# Patient Record
Sex: Female | Born: 1935 | Race: White | Hispanic: No | Smoking: Never smoker
Health system: Southern US, Community
[De-identification: ages and names within clinical notes are randomized; demographics above are authoritative.]

## PROBLEM LIST (undated history)

## (undated) DIAGNOSIS — I10 Essential (primary) hypertension: Secondary | ICD-10-CM

## (undated) DIAGNOSIS — M109 Gout, unspecified: Secondary | ICD-10-CM

## (undated) DIAGNOSIS — K5190 Ulcerative colitis, unspecified, without complications: Secondary | ICD-10-CM

## (undated) DIAGNOSIS — E119 Type 2 diabetes mellitus without complications: Secondary | ICD-10-CM

## (undated) DIAGNOSIS — C189 Malignant neoplasm of colon, unspecified: Secondary | ICD-10-CM

## (undated) DIAGNOSIS — H409 Unspecified glaucoma: Secondary | ICD-10-CM

## (undated) DIAGNOSIS — G4730 Sleep apnea, unspecified: Secondary | ICD-10-CM

## (undated) DIAGNOSIS — M8580 Other specified disorders of bone density and structure, unspecified site: Secondary | ICD-10-CM

## (undated) HISTORY — DX: Malignant neoplasm of colon, unspecified: C18.9

## (undated) HISTORY — DX: Unspecified glaucoma: H40.9

## (undated) HISTORY — DX: Sleep apnea, unspecified: G47.30

## (undated) HISTORY — PX: CATARACT EXTRACTION: SUR2

## (undated) HISTORY — DX: Ulcerative colitis, unspecified, without complications: K51.90

## (undated) HISTORY — DX: Essential (primary) hypertension: I10

## (undated) HISTORY — DX: Type 2 diabetes mellitus without complications: E11.9

## (undated) HISTORY — DX: Gout, unspecified: M10.9

## (undated) HISTORY — DX: Other specified disorders of bone density and structure, unspecified site: M85.80

---

## 1989-01-15 HISTORY — PX: PARTIAL COLECTOMY: SHX5273

## 1991-01-16 HISTORY — PX: BILATERAL SALPINGOOPHORECTOMY: SHX1223

## 1994-01-15 HISTORY — PX: EXPLORATORY LAPAROTOMY: SUR591

## 1997-05-04 ENCOUNTER — Other Ambulatory Visit: Admission: RE | Admit: 1997-05-04 | Discharge: 1997-05-04 | Payer: Self-pay | Admitting: Obstetrics and Gynecology

## 1997-09-01 ENCOUNTER — Ambulatory Visit (HOSPITAL_COMMUNITY): Admission: RE | Admit: 1997-09-01 | Discharge: 1997-09-01 | Payer: Self-pay | Admitting: *Deleted

## 1998-04-19 ENCOUNTER — Other Ambulatory Visit: Admission: RE | Admit: 1998-04-19 | Discharge: 1998-04-19 | Payer: Self-pay | Admitting: Obstetrics and Gynecology

## 1998-09-26 ENCOUNTER — Ambulatory Visit (HOSPITAL_COMMUNITY): Admission: RE | Admit: 1998-09-26 | Discharge: 1998-09-26 | Payer: Self-pay | Admitting: *Deleted

## 1998-09-26 ENCOUNTER — Encounter: Payer: Self-pay | Admitting: *Deleted

## 1998-10-05 ENCOUNTER — Ambulatory Visit (HOSPITAL_COMMUNITY): Admission: RE | Admit: 1998-10-05 | Discharge: 1998-10-05 | Payer: Self-pay | Admitting: Gastroenterology

## 1998-10-05 ENCOUNTER — Encounter (INDEPENDENT_AMBULATORY_CARE_PROVIDER_SITE_OTHER): Payer: Self-pay | Admitting: Specialist

## 1999-04-26 ENCOUNTER — Other Ambulatory Visit: Admission: RE | Admit: 1999-04-26 | Discharge: 1999-04-26 | Payer: Self-pay | Admitting: Obstetrics and Gynecology

## 2000-05-08 ENCOUNTER — Other Ambulatory Visit: Admission: RE | Admit: 2000-05-08 | Discharge: 2000-05-08 | Payer: Self-pay | Admitting: Obstetrics and Gynecology

## 2000-12-02 ENCOUNTER — Encounter (INDEPENDENT_AMBULATORY_CARE_PROVIDER_SITE_OTHER): Payer: Self-pay

## 2000-12-02 ENCOUNTER — Ambulatory Visit (HOSPITAL_COMMUNITY): Admission: RE | Admit: 2000-12-02 | Discharge: 2000-12-02 | Payer: Self-pay | Admitting: Gastroenterology

## 2001-05-14 ENCOUNTER — Other Ambulatory Visit: Admission: RE | Admit: 2001-05-14 | Discharge: 2001-05-14 | Payer: Self-pay | Admitting: *Deleted

## 2002-06-22 ENCOUNTER — Other Ambulatory Visit: Admission: RE | Admit: 2002-06-22 | Discharge: 2002-06-22 | Payer: Self-pay | Admitting: Obstetrics and Gynecology

## 2002-09-30 ENCOUNTER — Encounter: Payer: Self-pay | Admitting: *Deleted

## 2002-09-30 ENCOUNTER — Ambulatory Visit (HOSPITAL_COMMUNITY): Admission: RE | Admit: 2002-09-30 | Discharge: 2002-09-30 | Payer: Self-pay | Admitting: *Deleted

## 2002-10-07 ENCOUNTER — Ambulatory Visit (HOSPITAL_COMMUNITY): Admission: RE | Admit: 2002-10-07 | Discharge: 2002-10-08 | Payer: Self-pay | Admitting: *Deleted

## 2003-06-30 ENCOUNTER — Other Ambulatory Visit: Admission: RE | Admit: 2003-06-30 | Discharge: 2003-06-30 | Payer: Self-pay | Admitting: Obstetrics and Gynecology

## 2003-11-23 ENCOUNTER — Ambulatory Visit (HOSPITAL_COMMUNITY): Admission: RE | Admit: 2003-11-23 | Discharge: 2003-11-23 | Payer: Self-pay | Admitting: Gastroenterology

## 2004-12-20 ENCOUNTER — Ambulatory Visit (HOSPITAL_BASED_OUTPATIENT_CLINIC_OR_DEPARTMENT_OTHER): Admission: RE | Admit: 2004-12-20 | Discharge: 2004-12-20 | Payer: Self-pay | Admitting: Internal Medicine

## 2004-12-24 ENCOUNTER — Ambulatory Visit: Payer: Self-pay | Admitting: Internal Medicine

## 2005-10-03 ENCOUNTER — Other Ambulatory Visit: Admission: RE | Admit: 2005-10-03 | Discharge: 2005-10-03 | Payer: Self-pay | Admitting: Obstetrics and Gynecology

## 2005-10-25 ENCOUNTER — Ambulatory Visit (HOSPITAL_BASED_OUTPATIENT_CLINIC_OR_DEPARTMENT_OTHER): Admission: RE | Admit: 2005-10-25 | Discharge: 2005-10-25 | Payer: Self-pay | Admitting: Family Medicine

## 2005-10-28 ENCOUNTER — Ambulatory Visit: Payer: Self-pay | Admitting: Internal Medicine

## 2006-12-03 ENCOUNTER — Emergency Department (HOSPITAL_COMMUNITY): Admission: EM | Admit: 2006-12-03 | Discharge: 2006-12-03 | Payer: Self-pay | Admitting: Emergency Medicine

## 2007-05-06 ENCOUNTER — Encounter: Admission: RE | Admit: 2007-05-06 | Discharge: 2007-07-15 | Payer: Self-pay | Admitting: Family Medicine

## 2007-09-29 ENCOUNTER — Encounter: Admission: RE | Admit: 2007-09-29 | Discharge: 2007-09-29 | Payer: Self-pay | Admitting: Gastroenterology

## 2007-11-05 ENCOUNTER — Other Ambulatory Visit: Admission: RE | Admit: 2007-11-05 | Discharge: 2007-11-05 | Payer: Self-pay | Admitting: Obstetrics and Gynecology

## 2008-01-16 HISTORY — PX: TOTAL KNEE ARTHROPLASTY: SHX125

## 2008-08-06 ENCOUNTER — Encounter: Admission: RE | Admit: 2008-08-06 | Discharge: 2008-08-06 | Payer: Self-pay | Admitting: Family Medicine

## 2008-09-16 ENCOUNTER — Encounter: Admission: RE | Admit: 2008-09-16 | Discharge: 2008-09-16 | Payer: Self-pay | Admitting: Family Medicine

## 2008-10-18 ENCOUNTER — Inpatient Hospital Stay (HOSPITAL_COMMUNITY): Admission: RE | Admit: 2008-10-18 | Discharge: 2008-10-21 | Payer: Self-pay | Admitting: Orthopedic Surgery

## 2008-11-23 ENCOUNTER — Encounter: Admission: RE | Admit: 2008-11-23 | Discharge: 2008-12-30 | Payer: Self-pay | Admitting: Orthopedic Surgery

## 2009-03-28 ENCOUNTER — Encounter: Admission: RE | Admit: 2009-03-28 | Discharge: 2009-03-28 | Payer: Self-pay | Admitting: Family Medicine

## 2010-04-20 LAB — ABO/RH: ABO/RH(D): O POS

## 2010-04-20 LAB — PROTIME-INR
INR: 0.98 (ref 0.00–1.49)
INR: 1.25 (ref 0.00–1.49)
INR: 1.43 (ref 0.00–1.49)
Prothrombin Time: 12.9 seconds (ref 11.6–15.2)
Prothrombin Time: 15.6 seconds — ABNORMAL HIGH (ref 11.6–15.2)

## 2010-04-20 LAB — CBC
HCT: 27.1 % — ABNORMAL LOW (ref 36.0–46.0)
HCT: 32.5 % — ABNORMAL LOW (ref 36.0–46.0)
Hemoglobin: 8.9 g/dL — ABNORMAL LOW (ref 12.0–15.0)
Hemoglobin: 9 g/dL — ABNORMAL LOW (ref 12.0–15.0)
Platelets: 304 10*3/uL (ref 150–400)
RBC: 3.12 MIL/uL — ABNORMAL LOW (ref 3.87–5.11)
WBC: 11.6 10*3/uL — ABNORMAL HIGH (ref 4.0–10.5)
WBC: 8 10*3/uL (ref 4.0–10.5)
WBC: 8.3 10*3/uL (ref 4.0–10.5)

## 2010-04-20 LAB — BASIC METABOLIC PANEL
BUN: 7 mg/dL (ref 6–23)
Calcium: 8 mg/dL — ABNORMAL LOW (ref 8.4–10.5)
Calcium: 8.3 mg/dL — ABNORMAL LOW (ref 8.4–10.5)
Creatinine, Ser: 0.85 mg/dL (ref 0.4–1.2)
GFR calc Af Amer: >60 mL/min (ref >60)
GFR calc non Af Amer: >60 mL/min (ref >60)
GFR calc non Af Amer: >60 mL/min (ref >60)
Potassium: 4.1 mEq/L (ref 3.5–5.1)
Sodium: 135 mEq/L (ref 135–145)

## 2010-04-20 LAB — TYPE AND SCREEN

## 2010-04-21 LAB — COMPREHENSIVE METABOLIC PANEL
AST: 14 U/L (ref 0–37)
Alkaline Phosphatase: 71 U/L (ref 39–117)
BUN: 10 mg/dL (ref 6–23)
CO2: 30 mEq/L (ref 19–32)
Chloride: 106 mEq/L (ref 96–112)
Creatinine, Ser: 0.78 mg/dL (ref 0.4–1.2)
GFR calc Af Amer: >60 mL/min (ref >60)
GFR calc non Af Amer: >60 mL/min (ref >60)
Potassium: 4.2 mEq/L (ref 3.5–5.1)
Total Bilirubin: 0.7 mg/dL (ref 0.3–1.2)

## 2010-04-21 LAB — CBC
HCT: 35.5 % — ABNORMAL LOW (ref 36.0–46.0)
MCV: 88.6 fL (ref 78.0–100.0)
RBC: 4.01 MIL/uL (ref 3.87–5.11)
WBC: 6.3 10*3/uL (ref 4.0–10.5)

## 2010-04-21 LAB — URINALYSIS, ROUTINE W REFLEX MICROSCOPIC
Bilirubin Urine: NEGATIVE
Glucose, UA: NEGATIVE mg/dL
Hgb urine dipstick: NEGATIVE
Ketones, ur: NEGATIVE mg/dL
Nitrite: NEGATIVE
Specific Gravity, Urine: 1.008 (ref 1.005–1.030)
pH: 6 (ref 5.0–8.0)

## 2010-04-21 LAB — APTT: aPTT: 26 seconds (ref 24–37)

## 2010-06-02 NOTE — Procedures (Signed)
[facility]  Patient:    Jenna Chambers, Jenna Chambers Visit Number: [id number] MRN: [id number]          Service Type: END Location: ENDO Attending Physician:  Orland Mustard Dictated by:   Llana Aliment. Randa Evens, M.D. Proc. Date: 12/02/00 Admit Date:  12/02/2000   CC:         Meredith Staggers, M.D.   Procedure Report  PROCEDURE:  Colonoscopy and coagulation of polyp.  COLONOSCOPIST:  Llana Aliment. Edwards, M.D.  MEDICATIONS:  Fentanyl 87.5 mcg and Versed 9 mg IV.  SCOPE:  Olympus adult video colonoscope.  INDICATIONS:  The patient had some recent rectal bleeding.  She is a 77 year old woman who had a B2 carcinoma of hepatic flexure and approximately 10 years ago had a right hemicolectomy.  She has not had any recurrence, but had developed some recent rectal bleeding.  She was due early next year to have a repeat colonoscopy and for this reason we went ahead and did it now.  PROCEDURE:  The procedure had been explained to the patient and consent obtained.  With the patient in the left lateral decubitus position, the adult Olympus video colonoscope was inserted and advanced under direct visualization.  The prep was excellent and I easily reached the anastomosis. The small bowel was entered and normal.  The anastomosis was free of any recurrent tumor.  The scope was withdrawn and the transverse colon, splenic flexure, descending, and sigmoid colon were seen well.  No significant diverticular disease, no polyps.  In the rectum a 3-4 mm sessile polyp was cauterized.  Also internal hemorrhoids were seen in the anal canal.  No other polyps or other abnormalities were seen.  Scope was withdrawn, and the patient tolerated the procedure well.  ASSESSMENT: 1. Rectal polyp cauterized. 2. Internal hemorrhoids, probably source of recent bleeding.  PLAN:  Routine postpolypectomy instructions and will recommend repeating in three years.  Will give sheet of instructions  about local and general measures for hemorrhoids. Dictated by:   Llana Aliment. Randa Evens, M.D. Attending Physician:  Orland Mustard DD:  12/02/00 TD:  12/02/00 Job: [id number]

## 2010-06-02 NOTE — Procedures (Signed)
[name], Jenna Chambers                  ACCOUNT NO.:  [id number]   MEDICAL RECORD NO.:  [id number]          PATIENT TYPE:  OUT   LOCATION:  SLEEP CENTER                 FACILITY:  [facility]   PHYSICIAN:  Clinton D. Maple Hudson, MD, FCCP, FACPDATE OF BIRTH:  11-Oct-1933   DATE OF STUDY:                              NOCTURNAL POLYSOMNOGRAM   REFERRING PHYSICIAN:  Dr. Noberto Retort   INDICATION FOR STUDY:  Insomnia with sleep apnea.   EPWORTH SLEEPINESS SCORE:  Per patient was 0/24.  BMI 42.6, weight 250  pounds.   HOME MEDICATIONS:  Are listed and reviewed.   A diagnostic NPSG on December 20, 2004, had recorded an AHI of 50.9 per hour  with markedly fragmented sleep at that time.  CPAP titration study is now  requested.   SLEEP ARCHITECTURE:  Short total sleep time 177 minutes with sleep  efficiency 50%.  Stage 1 was 10%, stage 2 60%, stages 3 and 4 12%, REM 18%  of total sleep time.  Sleep latency 14 minutes, REM latency 306 minutes,  awake after sleep onset 164 minutes, arousal index 7.5.  No bedtime  medication was reported.  Sleep onset was 11:12 p.m. but sleep was  fragmented by repeated wakings, especially after midnight, with little  recorded sleep after 2 a.m. until nearly 4 a.m.   RESPIRATORY DATA:  CPAP titration protocol.  CPAP was titrated to 12 CWP,  AHI zero per hour.  Many different masks were reportedly tried, settling  finally on a medium Ultra Mirage mask.  She indicated that she did not like  wearing it and did not like the pressure on her face.   OXYGEN DATA:  With CPAP control, oxygen saturation held 94% on room air.   CARDIAC DATA:  Normal sinus rhythm.   MOVEMENT/PARASOMNIA:  Occasional limb jerk, insignificant.   IMPRESSION/RECOMMENDATION:  1. CPAP was titrated to 12 CWP, apnea/hypopnea index zero per hour.  She      was very uncomfortable with the mask she selected, a medium size Ultra      Mirage mask with heated humidifier.  She was uncomfortable with all   masks tried.  If CPAP therapy is to be successful, she will need to      work with the home care company on desensitization and comfort      measures.  It may be that she would better tolerate a small profile      mask such as a nasal pillows system.  If appropriate, a gentle sleep      aid might make it easier for her to tolerate the CPAP process until she      adjusts to it.  Alternative therapies, or a choice at her age not to      treat this condition, might be considered.  2. Her persistent sleep circumstance seems to be insomnia.  This may      actually be a second primary diagnosis      rather than entirely the result of her sleep-disordered breathing.  In      that case she may benefit from maintenance sleep medication if  clinically appropriate.      Clinton D. Maple Hudson, MD, [facility], FACP  Diplomate, Biomedical engineer of Sleep Medicine  Electronically Signed     CDY/MEDQ  D:  10/28/2005 12:45:07  T:  10/29/2005 13:56:11  Job:  [id number]

## 2010-06-02 NOTE — Cardiovascular Report (Signed)
Jenna Chambers, Jenna Chambers                            ACCOUNT NO.:  [id number]   MEDICAL RECORD NO.:  [id number]                   PATIENT TYPE:  OIB   LOCATION:  3743                                 FACILITY:  [facility]   PHYSICIAN:  Meade Maw, M.D.                 DATE OF BIRTH:  1931-09-12   DATE OF PROCEDURE:  DATE OF DISCHARGE:                              CARDIAC CATHETERIZATION   REFERRING PHYSICIAN:  Tama Headings. Marina Goodell, M.D.   INDICATIONS FOR PROCEDURE:  Ongoing chest pain with stress Cardiolite  revealing decreased uptake in the anterior apical region with evidence of  reversal.  She also has significant shortness of breath.  Her coronary risk  factors include hypertension, dyslipidemia, and family history.  It was  elected to have the patient admitted for observation secondary to her age,  obesity, and late timing of her catheterization.   PROCEDURE:  After obtaining written informed consent the patient was brought  to the outpatient cardiac catheterization laboratory in the postabsorptive  state.  Preoperative sedation was achieved using IV Versed.  The right groin  was prepped and draped in the usual sterile fashion.  Local anesthesia was  achieved using 1% Xylocaine.  A 6-French hemostasis sheath was placed into a  right femoral artery using the modified Seldinger technique.  Selective  coronary angiography was performed using a JL4, JR4 Judkins catheter.  Single plane ventriculogram was performed in the RAO position using a 6-  French pigtail curved catheter.  All catheter exchanges were made over a  guidewire.  Following the procedure there was no identifiable disease.  The  patient was transferred to the holding area.  The hemostasis sheath was  removed.  Hemostasis was achieved using digital pressure.  There were no  immediate complications.   FINDINGS:  The aortic pressure is 135/61.  LV pressure is 137/10.  The EDP  was 19.  Single plane ventriculogram reveals normal  wall motion.  There was  post PVC MR only.   CORONARY ANGIOGRAPHY:  The left main coronary artery was relatively short.  It bifurcated into the left anterior descending and circumflex vessel.  There is no disease in the left main coronary artery.   The left anterior descending gives rise to a large D1, a smaller D2, and  goes on to end as a large apical branch.  There is no disease in the left  anterior descending or its branches.   The circumflex vessel is a large branch.  Gives rise to a trivial OM 1,  trivial OM 2, and OM 3.  There is no disease in the circumflex or its  branches.   The right coronary artery is a large artery as well.  Gives rise to three RV  marginals, PDA, and a large PL branch.  There is no disease in the right  coronary artery or its branches.    FINAL  IMPRESSION:  1. Normal coronary angiography.  2. Normal single plane ventriculogram.   RECOMMENDATIONS:  Consider other etiologies for her chest pain.                                               Meade Maw, M.D.    HP/MEDQ  D:  10/07/2002  T:  10/08/2002  Job:  [id number]

## 2010-06-02 NOTE — Discharge Summary (Signed)
Jenna Chambers, Jenna Chambers                            ACCOUNT NO.:  [id number]   MEDICAL RECORD NO.:  [id number]                   PATIENT TYPE:  OIB   LOCATION:  3743                                 FACILITY:  [facility]   PHYSICIAN:  Meade Maw, M.D.                 DATE OF BIRTH:  Oct 22, 1931   DATE OF ADMISSION:  10/07/2002  DATE OF DISCHARGE:  10/08/2002                                 DISCHARGE SUMMARY   DISCHARGING PHYSICIAN:  Dr. Fraser Din.   HISTORY OF PRESENT ILLNESS/REASON FOR ADMISSION:  Jenna Chambers is a 77-year-  old female patient who was initially seen on hospital consultation, September 09, 2002, for complaints of chest pain.  Significant history of hypertension  and osteoarthritis, and obesity.  During that admission, patient underwent  adenosine Cardiolite stress testing, which showed decreased uptake in the  mid anterior wall involving the apex, with normal wall motion and ejection  fraction of 72%.  Due to patient's recurrent complaints of chest pain, Dr.  Fraser Din, in cooperation with the patient, determined that cardiac  catheterization was indicated, and patient was subsequently admitted for a  cardiac catheterization.   HOSPITAL COURSE:  1. Recurrent chest pain.  The patient underwent cardiac catheterization on     October 07, 2002, without any complications.  Site was unremarkable.     Patient essentially had a normal cardiac catheterization with normal wall     motion and an EF of 65%.  The patient stayed in the hospital for further     evaluation, and on the morning of discharge was without any significant     complaints, as the cath site was unremarkable and patient's vital signs     were stable, with a blood pressure of 134/68, heart rate 68 and sinus     rhythm on the monitor.   FINAL DISCHARGE DIAGNOSES:  1. Recurrent chest pain since June of 2004, with normal cardiac     catheterization on October 07, 2002.  2. Obesity.  3. Hypertension.  4.  Hypothyroidism.  5. Osteoarthritis.   DISCHARGE MEDICATIONS:  1. Altace 10 mg daily.  2. Synthroid 100 mcg daily.  3. Vioxx 12.5 mg daily.  4. Xalatan eye drops 0.005%, one drop both eyes at bedtime.  5. Aspirin 81 mg daily.  6. Prempro, previous dose.  7. Timoptic eye drops at previous dose.  8. Multivitamin daily.  9. Vitamin E daily.  10.      Calcium 1200 mg daily.   DISCHARGE INSTRUCTIONS:  Tylenol for any pain other than medications you are  currently taking for your osteoarthritis.   ACTIVITY:  No driving or sexual activity today.  No lifting greater than 5  pounds.  No bending, stooping or straining for the next two days.   DIET:  Cardiac, low fat, low cholesterol.   WOUND CARE:  Shower only for the next two days.  SPECIAL INSTRUCTIONS:  You need to call Dr. Lindell Spar office at [phone] if  you have pain, swelling or bruising at the catheter site.   FOLLOW-UP APPOINTMENT:  She is to see Dr. Fraser Din on Monday, November 02, 2002, at 11:30 a.m.      Allison L. Joretta Bachelor, M.D.    ALE/MEDQ  D:  10/08/2002  T:  10/09/2002  Job:  [id number]

## 2010-06-02 NOTE — Procedures (Signed)
NAMEJera, Headings Bailei                  ACCOUNT NO.:  [id number]   MEDICAL RECORD NO.:  [id number]          PATIENT TYPE:  OUT   LOCATION:  SLEEP CENTER                 FACILITY:  [facility]   PHYSICIAN:  Clinton D. Maple Hudson, M.D. DATE OF BIRTH:  02-09-1931   DATE OF STUDY:                              NOCTURNAL POLYSOMNOGRAM   REFERRING PHYSICIAN:  Dr. Noberto Retort.   DATE OF STUDY:  December 20, 2004.   INDICATION FOR STUDY:  Insomnia with sleep apnea.   EPWORTH SLEEPINESS SCORE:  10/24.   BMI:  42.   WEIGHT:  250 pounds.   SLEEP ARCHITECTURE:  Short total sleep time 191 minutes with sleep  efficiency 44%. Stage I was 23%, stage II 62%, stages III and IV 2%, REM 13%  of total sleep time. Sleep latency 22 minutes, awake after sleep onset 209  minutes, arousal index markedly increased at 80 per hour. Sleep onset was at  11:12 p.m. but sustained sleep was obtained only briefly around 1:30 a.m.  and again around 5 a.m. with markedly fragmented sleep. No bedtime  medication was reported. Two bathroom trips were described as prolonged by  the technician.   RESPIRATORY DATA:  Apnea/hypopnea index (AHI, RDI) 50.9 obstructive events  per hour of sleep indicating severe obstructive sleep apnea/hypopnea  syndrome. This included 39 obstructive apneas and 123 hypopneas. Onset was  too late and sleep was too fragmented to permit C-PAP titration by protocol  on the study night. Events were not positional. REM AHI 49 per hour.   OXYGEN DATA:  Mild to moderate snoring with oxygen desaturation to a nadir  of 63%. Mean oxygen saturation through the study was 95% on room air.   CARDIAC DATA:  Sinus bradycardia 52-56 per minute with occasional PVC.   MOVEMENT/PARASOMNIA:  Occasional leg jerk with little effect on sleep.  Prolonged bathroom trips x2. Complaint of hip pain when up in room.   IMPRESSION/RECOMMENDATIONS:  1.  Short and markedly fragmented sleep.  2.  Severe obstructive sleep apnea  during limited sleep time, AHI of 50.9      per hour with nonpositional events.      Mild to moderate snoring and oxygen desaturation to 63%.  3.  Consider return for C-PAP titration bringing a sleep medicine to improve      sleep consolidation for the study.      Clinton D. Maple Hudson, M.D.  Diplomate, Biomedical engineer of Sleep Medicine  Electronically Signed     CDY/MEDQ  D:  12/24/2004 11:45:56  T:  12/24/2004 23:52:13  Job:  [id number]

## 2010-06-02 NOTE — Op Note (Signed)
NAME:  Jenna Chambers, Jenna Chambers                  ACCOUNT NO.:  [id number]   MEDICAL RECORD NO.:  [id number]          PATIENT TYPE:  AMB   LOCATION:  ENDO                         [facility]ACILITY:  [facility]   PHYSICIAN:  James L. Malon Kindle., M.D.DATE OF BIRTH:  June 21, 1931   DATE OF PROCEDURE:  11/23/2003  DATE OF DISCHARGE:                                 OPERATIVE REPORT   PROCEDURE:  Colonoscopy.   MEDICATIONS:  Fentanyl 62.5 mcg, Versed 6 mg IV.   SCOPE:  Pediatric colonoscope.   INDICATIONS:  Patient with a previous history of D2 carcinoma of the hepatic  flexure and had a right hemicolectomy bout 10 years ago.  Rectal polyp  removed three years ago.   DESCRIPTION OF PROCEDURE:  The procedure had been explained to the patient  and consent obtained.  With the patient in the left lateral decubitus  position, the Olympus scope was inserted and advanced.  Prep was excellent.  We were able to reach the anastomosis, which was clearly seen, and the small  bowel was entered for a short distance.  The scope was withdrawn.  The  anastomosis, transverse colon, descending, and sigmoid colon were seen well.  Scattered diverticulosis.  No polyps seen throughout.  Some sticky, adherent  stool.  The rectum was free of polyps.  The scope was withdrawn.  The  patient tolerated the procedure well.   ASSESSMENT:  Previous history of colon cancer with negative colonoscopy at  this time.  V10.05.   PLAN:  Will recommend yearly Hemoccults and repeat colonoscopy in five  years.      JLE/MEDQ  D:  11/23/2003  T:  11/23/2003  Job:  [id number]   cc:   Meredith Staggers, M.D.  [address]  [place]  [place] [address]  Fax: [phone]

## 2012-05-27 ENCOUNTER — Other Ambulatory Visit: Payer: Self-pay | Admitting: Gastroenterology

## 2012-05-27 DIAGNOSIS — R1310 Dysphagia, unspecified: Secondary | ICD-10-CM

## 2012-06-02 ENCOUNTER — Ambulatory Visit
Admission: RE | Admit: 2012-06-02 | Discharge: 2012-06-02 | Disposition: A | Discharge status: Home / Self Care | Payer: Medicare Other | Source: Ambulatory Visit | Attending: Gastroenterology | Admitting: Gastroenterology

## 2012-06-02 DIAGNOSIS — R1310 Dysphagia, unspecified: Secondary | ICD-10-CM

## 2012-07-07 ENCOUNTER — Other Ambulatory Visit: Payer: Self-pay | Admitting: Gastroenterology

## 2013-02-06 ENCOUNTER — Ambulatory Visit: Payer: Self-pay | Admitting: Obstetrics and Gynecology

## 2013-02-06 ENCOUNTER — Ambulatory Visit: Payer: Self-pay | Admitting: Gynecology

## 2013-02-27 ENCOUNTER — Ambulatory Visit (INDEPENDENT_AMBULATORY_CARE_PROVIDER_SITE_OTHER): Payer: MEDICARE | Admitting: Gynecology

## 2013-02-27 ENCOUNTER — Encounter: Payer: Self-pay | Admitting: Gynecology

## 2013-02-27 VITALS — BP 150/85 | HR 62 | Resp 18 | Ht 64.0 in | Wt 164.0 lb

## 2013-02-27 DIAGNOSIS — K5190 Ulcerative colitis, unspecified, without complications: Secondary | ICD-10-CM | POA: Insufficient documentation

## 2013-02-27 DIAGNOSIS — I10 Essential (primary) hypertension: Secondary | ICD-10-CM | POA: Insufficient documentation

## 2013-02-27 DIAGNOSIS — E119 Type 2 diabetes mellitus without complications: Secondary | ICD-10-CM | POA: Insufficient documentation

## 2013-02-27 DIAGNOSIS — C189 Malignant neoplasm of colon, unspecified: Secondary | ICD-10-CM | POA: Insufficient documentation

## 2013-02-27 DIAGNOSIS — Z01419 Encounter for gynecological examination (general) (routine) without abnormal findings: Secondary | ICD-10-CM

## 2013-02-27 DIAGNOSIS — H409 Unspecified glaucoma: Secondary | ICD-10-CM | POA: Insufficient documentation

## 2013-02-27 NOTE — Patient Instructions (Signed)

## 2013-02-27 NOTE — Progress Notes (Signed)
80 y.o. Widowed Caucasian female   No obstetric history on file. here for annual exam. Pt reports menses are absent due to Menopause. She does not report post-menopasual bleeding  No LMP recorded.          Sexually active: no  The current method of family planning is post menopausal status.    Exercising: no  The patient does not participate in regular exercise at present. Last pap: 11/05/2007 Negative  Abnormal PAP: no Mammogram: 01/10/12 Bi-Rads 2 BSE: yes Colonoscopy: 2014- Normal DEXA: 12/2011  Alcohol: 1 drink/month Tobacco: no  Labs:  Shirline Frees, MD  Health Maintenance  Topic Date Due  . Tetanus/tdap  03/05/1952  . Colonoscopy  03/06/1983  . Zostavax  03/05/1993  . Pneumococcal Polysaccharide Vaccine Age 45 And Over  03/05/1998  . Influenza Vaccine  08/15/2012    No family history on file.  Patient Active Problem List   Diagnosis Date Noted  . Diabetes     Past Medical History  Diagnosis Date  . Diabetes     Type 2    No past surgical history on file.  Allergies: Latex and Penicillins  Current Outpatient Prescriptions  Medication Sig Dispense Refill  . Acetaminophen (TYLENOL EXTRA STRENGTH) 167 MG/5ML LIQD Take by mouth.      Marland Kitchen amLODipine (NORVASC) 5 MG tablet       . aspirin 81 MG tablet Take 81 mg by mouth daily.      . Cholecalciferol (VITAMIN D) 2000 UNITS CAPS Take by mouth.      . diclofenac sodium (VOLTAREN) 1 % GEL Apply topically as needed.      Marland Kitchen levothyroxine (SYNTHROID, LEVOTHROID) 125 MCG tablet       . losartan (COZAAR) 100 MG tablet       . meloxicam (MOBIC) 15 MG tablet Take 15 mg by mouth as needed for pain.      Marland Kitchen SIMBRINZA 1-0.2 % SUSP       . sulfaSALAzine (AZULFIDINE) 500 MG tablet        No current facility-administered medications for this visit.    ROS: Pertinent items are noted in HPI.  Exam:    BP 150/85  Pulse 62  Resp 18  Ht 5\' 4"  (1.626 m)  Wt 164 lb (74.39 kg)  BMI 28.14 kg/m2 Weight change: @WEIGHTCHANGE @  Last 3 height recordings:  Ht Readings from Last 3 Encounters:  02/27/13 5\' 4"  (1.626 m)   General appearance: alert, cooperative and appears stated age Head: Normocephalic, without obvious abnormality, atraumatic Neck: no adenopathy, no carotid bruit, no JVD, supple, symmetrical, trachea midline and thyroid not enlarged, symmetric, no tenderness/mass/nodules Lungs: clear to auscultation bilaterally Breasts: normal appearance, no masses or tenderness Heart: regular rate and rhythm, S1, S2 normal, no murmur, click, rub or gallop Abdomen: soft, non-tender; bowel sounds normal; no masses,  no organomegaly Extremities: extremities normal, atraumatic, no cyanosis or edema Skin: Skin color, texture, turgor normal. No rashes or lesions Lymph nodes: Cervical, supraclavicular, and axillary nodes normal. no inguinal nodes palpated Neurologic: Grossly normal   Pelvic: External genitalia:  no lesions              Urethra: normal appearing urethra with no masses, tenderness or lesions              Bartholins and Skenes: normal                 Vagina: atrophic  Cervix: normal appearance              Pap taken: no        Bimanual Exam:  Uterus:  Small, limited by habitus                                      Adnexa:    no masses                                      Rectovaginal: Confirms                                      Anus:  normal sphincter tone, no lesions  A: well woman Contraceptive management     P: mammogram counseled on breast self exam, mammography screening, adequate intake of calcium and vitamin D, diet and exercise return annually or prn Discussed PAP guideline changes, importance of weight bearing exercises, calcium, vit D and balanced diet.  An After Visit Summary was printed and given to the patient.

## 2014-03-19 ENCOUNTER — Ambulatory Visit: Payer: MEDICARE | Admitting: Gynecology

## 2014-03-22 ENCOUNTER — Ambulatory Visit: Payer: MEDICARE | Admitting: Certified Nurse Midwife

## 2014-03-31 ENCOUNTER — Ambulatory Visit: Payer: Self-pay | Admitting: Obstetrics and Gynecology

## 2014-04-07 ENCOUNTER — Ambulatory Visit: Payer: Self-pay | Admitting: Obstetrics and Gynecology

## 2014-04-28 ENCOUNTER — Encounter: Payer: Self-pay | Admitting: Obstetrics and Gynecology

## 2014-04-28 ENCOUNTER — Ambulatory Visit (INDEPENDENT_AMBULATORY_CARE_PROVIDER_SITE_OTHER): Payer: Medicare Other | Admitting: Obstetrics and Gynecology

## 2014-04-28 VITALS — BP 132/62 | HR 60 | Resp 14 | Ht 64.0 in | Wt 246.0 lb

## 2014-04-28 DIAGNOSIS — Z01419 Encounter for gynecological examination (general) (routine) without abnormal findings: Secondary | ICD-10-CM | POA: Diagnosis not present

## 2014-04-28 NOTE — Progress Notes (Signed)
Patient ID: Jenna Chambers, female   DOB: Jul 23, 1931, 81 y.o.   MRN: [id number] 81 y.o. G0P0 WidowedCaucasianF here for annual exam.    Took HRT for several years.  Stopped in 2010.  No bleeding or spotting.   Status post BSO in 1996.  Cystadenoma.  Still has uterus.   Hx of colon cancer.  Had partial colectomy in 1992.  Has ulcerative colitis.  Sees GI regularly.    Some urinary urgency.  Not new.   PCP:  Samara Snide, MD   Patient's last menstrual period was 01/15/1982 (approximate).          Sexually active: No. Widowed The current method of family planning is post menopausal status.    Exercising: No.  none. Smoker:  no  Health Maintenance: Pap:  11-05-07 normal History of abnormal Pap:  no MMG:  04-27-14-not resulted yet:  03-16-13 fatty/stable oil cysts left breast/nl:[facility].   Colonoscopy:  2014 normal with Dr. Lorna Few of colon CA and ulcerative colitis.  Next due 2019. BMD:   01-01-12  Osteopenia:  [facility].    TDaP:  PCP Screening Labs:  Hb today: PCP, Urine today: PCP   reports that she has never smoked. She does not have any smokeless tobacco history on file. She reports that she does not drink alcohol or use illicit drugs.  Past Medical History  Diagnosis Date  . Diabetes     Type 2  . Ulcerative colitis   . Colon cancer     resecetion  . Gout   . Hypertension   . Sleep apnea     no CPAP  . Osteopenia     Past Surgical History  Procedure Laterality Date  . Bilateral salpingoophorectomy  1993    serous cystadenoma  . Partial colectomy  1991  . Cataract extraction  2006, 2008    gluacoma  . Exploratory laparotomy  1996    incarcerated hernia  . Total knee arthroplasty Left 2010    Current Outpatient Prescriptions  Medication Sig Dispense Refill  . Acetaminophen (TYLENOL EXTRA STRENGTH) 167 MG/5ML LIQD Take by mouth.    Marland Kitchen amLODipine (NORVASC) 10 MG tablet Take 10 mg by mouth daily.    Marland Kitchen aspirin 81 MG tablet Take 81 mg by mouth daily.    .  Cholecalciferol (VITAMIN D) 2000 UNITS CAPS Take by mouth.    . diclofenac sodium (VOLTAREN) 1 % GEL Apply topically as needed.    Marland Kitchen levothyroxine (SYNTHROID, LEVOTHROID) 150 MCG tablet Take 150 mcg by mouth daily before breakfast.    . losartan (COZAAR) 100 MG tablet     . meloxicam (MOBIC) 15 MG tablet Take 15 mg by mouth as needed for pain.    Marland Kitchen sulfaSALAzine (AZULFIDINE) 500 MG tablet Take 1,000 mg by mouth 2 (two) times daily.    Marland Kitchen ZIOPTAN 0.0015 % SOLN Place 1 drop into both eyes daily.  12   No current facility-administered medications for this visit.    Family History  Problem Relation Age of Onset  . Heart disease Father     dec age 18  . Hypertension Father   . Osteoarthritis Mother   . Osteoporosis Mother   . Hypertension Sister   . Hypertension Brother   . Cancer Brother 90    colon ca    ROS:  Pertinent items are noted in HPI.  Otherwise, a comprehensive ROS was negative.  Exam:   BP 132/62 mmHg  Pulse 60  Resp 14  Ht 5'  4" (1.626 m)  Wt 246 lb (111.585 kg)  BMI 42.21 kg/m2  LMP 01/15/1982 (Approximate)    Height: 5\' 4"  (162.6 cm)  Ht Readings from Last 3 Encounters:  04/28/14 5\' 4"  (1.626 m)  02/27/13 5\' 4"  (1.626 m)    General appearance: alert, cooperative and appears stated age Head: Normocephalic, without obvious abnormality, atraumatic Neck: no adenopathy, supple, symmetrical, trachea midline and thyroid normal to inspection and palpation Lungs: clear to auscultation bilaterally Breasts: normal appearance, no masses or tenderness, Inspection negative, No nipple retraction or dimpling, No nipple discharge or bleeding, No axillary or supraclavicular adenopathy Heart: regular rate and rhythm Abdomen: vertical midline incision, obese, soft, non-tender; bowel sounds normal; no masses,  no organomegaly Extremities: extremities normal, atraumatic, no cyanosis or edema Skin: Skin color, texture, turgor normal. No rashes or lesions Lymph nodes: Cervical,  supraclavicular, and axillary nodes normal. No abnormal inguinal nodes palpated Neurologic: Grossly normal   Pelvic: External genitalia:  no lesions              Urethra:  normal appearing urethra with no masses, tenderness or lesions              Bartholins and Skenes: normal                 Vagina: normal appearing vagina with normal color and discharge, no lesions.  Exam limited by body habitus.               Cervix: no lesions              Pap taken: Yes.   Bimanual Exam:  Uterus:  normal size, contour, position, consistency, mobility, non-tender              Adnexa: normal adnexa and no mass, fullness, tenderness               Rectovaginal: Confirms               Anus:  normal sphincter tone, no lesions  Chaperone was present for exam.  A:  Well Woman with normal exam Osteopenia.  Status post BSO.  History of colon cancer.   Status post partial colectomy.  Urinary urgency.   P:   Mammogram yearly.  pap smear done today.  Bone density at [facility].  Will assist with scheduling.  Discussed calcium, vit D, and weight bearing exercise.  Discussed bladder irritants.   return annually or prn

## 2014-04-28 NOTE — Patient Instructions (Signed)

## 2014-04-29 LAB — IPS PAP SMEAR ONLY

## 2014-05-26 ENCOUNTER — Telehealth: Payer: Self-pay

## 2014-05-26 NOTE — Telephone Encounter (Signed)
Spoke with patient. Advised patient we have received her BMD results from [facility] from 05/18/2014 study. Advised Dr. Quincy Simmonds recommends scheduling a consultation to discuss her osteopenia and increased fracture risk. Patient is agreeable. Patient would like to call back to schedule appointment as she does not have her calendar with her at this time.   Routing to provider for final review. Patient agreeable to disposition. Patient aware provider will review message and nurse will return call with any additional instructions or change of disposition. Will close encounter.

## 2014-05-26 NOTE — Telephone Encounter (Signed)
Left message to call [name] at [phone]. 

## 2015-02-02 ENCOUNTER — Ambulatory Visit: Payer: Self-pay | Admitting: Obstetrics and Gynecology

## 2015-03-23 DIAGNOSIS — K5190 Ulcerative colitis, unspecified, without complications: Secondary | ICD-10-CM | POA: Diagnosis not present

## 2015-03-23 DIAGNOSIS — Z85038 Personal history of other malignant neoplasm of large intestine: Secondary | ICD-10-CM | POA: Diagnosis not present

## 2015-04-05 DIAGNOSIS — Z85038 Personal history of other malignant neoplasm of large intestine: Secondary | ICD-10-CM | POA: Diagnosis not present

## 2015-04-11 DIAGNOSIS — I10 Essential (primary) hypertension: Secondary | ICD-10-CM | POA: Diagnosis not present

## 2015-04-11 DIAGNOSIS — E039 Hypothyroidism, unspecified: Secondary | ICD-10-CM | POA: Diagnosis not present

## 2015-04-11 DIAGNOSIS — E119 Type 2 diabetes mellitus without complications: Secondary | ICD-10-CM | POA: Diagnosis not present

## 2015-04-11 DIAGNOSIS — N189 Chronic kidney disease, unspecified: Secondary | ICD-10-CM | POA: Diagnosis not present

## 2015-04-27 DIAGNOSIS — H401111 Primary open-angle glaucoma, right eye, mild stage: Secondary | ICD-10-CM | POA: Diagnosis not present

## 2015-04-27 DIAGNOSIS — H1859 Other hereditary corneal dystrophies: Secondary | ICD-10-CM | POA: Diagnosis not present

## 2015-04-27 DIAGNOSIS — H401122 Primary open-angle glaucoma, left eye, moderate stage: Secondary | ICD-10-CM | POA: Diagnosis not present

## 2015-04-27 DIAGNOSIS — Z961 Presence of intraocular lens: Secondary | ICD-10-CM | POA: Diagnosis not present

## 2015-05-04 DIAGNOSIS — Z1231 Encounter for screening mammogram for malignant neoplasm of breast: Secondary | ICD-10-CM | POA: Diagnosis not present

## 2015-05-11 DIAGNOSIS — H401122 Primary open-angle glaucoma, left eye, moderate stage: Secondary | ICD-10-CM | POA: Diagnosis not present

## 2015-05-12 ENCOUNTER — Ambulatory Visit (INDEPENDENT_AMBULATORY_CARE_PROVIDER_SITE_OTHER): Payer: Medicare Other | Admitting: Obstetrics and Gynecology

## 2015-05-12 ENCOUNTER — Encounter: Payer: Self-pay | Admitting: Obstetrics and Gynecology

## 2015-05-12 VITALS — BP 152/68 | HR 60 | Resp 16 | Ht 63.75 in | Wt 245.0 lb

## 2015-05-12 DIAGNOSIS — M8580 Other specified disorders of bone density and structure, unspecified site: Secondary | ICD-10-CM

## 2015-05-12 DIAGNOSIS — Z124 Encounter for screening for malignant neoplasm of cervix: Secondary | ICD-10-CM | POA: Diagnosis not present

## 2015-05-12 DIAGNOSIS — Z01419 Encounter for gynecological examination (general) (routine) without abnormal findings: Secondary | ICD-10-CM

## 2015-05-12 NOTE — Progress Notes (Signed)
Patient ID: Jenna Chambers, female   DOB: [birth year], 82 y.o.   MRN: [id number] 82 y.o. G0P0 Widowed Caucasian female here for annual exam.    Concerns about medication for osteopenia.  Has hiatal hernia and ulcerative colitis.  Has not taken a reflux medication regularly.   Took Boniva for one year.  No problems with this.   GFR low per patient.   PCP:  Minette Brine, MD  Patient's last menstrual period was 01/15/1982 (approximate).           Sexually active: No. female The current method of family planning is post menopausal status.    Exercising: No.   Smoker:  no  Health Maintenance: Pap:  04-28-14 Neg History of abnormal Pap:  no MMG:  05-04-15 3D/Density A/Neg/BiRads1:[facility] Colonoscopy:  2014 normal Dr.James Edwards;next 2019 due to family history. BMD:   05-18-14  Result:Osteopenia, increased fracture risk:[facility] TDaP:  PCP Gardasil:   N/A Hep C: NA. Screening Labs:  Hb today: PCP, Urine today: PCP   reports that she has never smoked. She does not have any smokeless tobacco history on file. She reports that she does not drink alcohol or use illicit drugs.  Past Medical History  Diagnosis Date  . Diabetes ([place])     Type 2  . Ulcerative colitis ([place])   . Colon cancer ([place])     resecetion  . Gout   . Hypertension   . Sleep apnea     no CPAP  . Osteopenia   . Glaucoma     --both eyes per patient getting worse    Past Surgical History  Procedure Laterality Date  . Bilateral salpingoophorectomy  1993    serous cystadenoma  . Partial colectomy  1991  . Cataract extraction  2006, 2008    gluacoma  . Exploratory laparotomy  1996    incarcerated hernia  . Total knee arthroplasty Left 2010    Current Outpatient Prescriptions  Medication Sig Dispense Refill  . acetaminophen (TYLENOL) 500 MG tablet Take 1,000 mg by mouth every 6 (six) hours as needed.    Marland Kitchen amLODipine (NORVASC) 10 MG tablet Take 10 mg by mouth daily.    Marland Kitchen aspirin 81 MG tablet Take 81 mg by mouth  daily.    . Cholecalciferol (VITAMIN D) 2000 UNITS CAPS Take by mouth.    . diclofenac sodium (VOLTAREN) 1 % GEL Apply topically as needed.    . folic acid (FOLVITE) 1 MG tablet Take 1 mg by mouth daily.    Marland Kitchen levothyroxine (SYNTHROID, LEVOTHROID) 150 MCG tablet Take 150 mcg by mouth daily before breakfast.    . losartan (COZAAR) 100 MG tablet     . meloxicam (MOBIC) 15 MG tablet Take 15 mg by mouth as needed for pain.    Marland Kitchen sulfaSALAzine (AZULFIDINE) 500 MG tablet Take 1,000 mg by mouth 2 (two) times daily.    Marland Kitchen ZIOPTAN 0.0015 % SOLN Place 1 drop into both eyes daily.  12   No current facility-administered medications for this visit.    Family History  Problem Relation Age of Onset  . Heart disease Father     dec age 36  . Hypertension Father   . Osteoarthritis Mother   . Osteoporosis Mother   . Hypertension Sister   . Hypertension Brother   . Cancer Brother 90    colon ca    ROS:  Pertinent items are noted in HPI.  Otherwise, a comprehensive ROS was negative.  Exam:   BP  152/68 mmHg  Pulse 60  Resp 16  Ht 5' 3.75" (1.619 m)  Wt 245 lb (111.131 kg)  BMI 42.40 kg/m2  LMP 01/15/1982 (Approximate)    General appearance: alert, cooperative and appears stated age Head: Normocephalic, without obvious abnormality, atraumatic Neck: no adenopathy, supple, symmetrical, trachea midline and thyroid normal to inspection and palpation Lungs: clear to auscultation bilaterally Breasts: normal appearance, no masses or tenderness, Inspection negative, No nipple retraction or dimpling, No nipple discharge or bleeding Heart: regular rate and rhythm Abdomen: incisions:  Yes.     Vertical midline , soft, non-tender; no masses, no organomegaly Extremities: extremities normal, atraumatic, no cyanosis or edema Skin: Skin color, texture, turgor normal. No rashes or lesions Lymph nodes: Cervical, supraclavicular, and axillary nodes normal. No abnormal inguinal nodes palpated Neurologic: Grossly  normal  Pelvic: External genitalia:  no lesions              Urethra:  normal appearing urethra with no masses, tenderness or lesions              Bartholins and Skenes: normal                 Vagina: normal appearing vagina with normal color and discharge, no lesions              Cervix: no lesions              Pap taken: No. Bimanual Exam:  Uterus:  normal size, contour, position, consistency, mobility, non-tender              Adnexa: normal adnexa and no mass, fullness, tenderness              Rectal exam: Yes.  .  Confirms.              Anus:  normal sphincter tone, no lesions  Chaperone was present for exam.  Assessment:   Well woman visit with normal exam. Osteopenia.  At risk for fracture.  Hx malabsorption.  Hx hiatal hernia.  HTN.  Decreased GFR.  Plan: Yearly mammogram recommended after age 10.  Recommended self breast exam.  Pap and HR HPV as above. Discussed Calcium, Vitamin D, regular exercise program including cardiovascular and weight bearing exercise. Labs performed.  No..     Prescription medication(s) given.  No..    Discussed options for care for osteopenia - Prolia, Reclast, Boniva, Evista.  Written information to patienet.  I will have patient discuss tx of osteopenia with her PCP due to her multiple medical issues. Discussed reducing risk of falls. Follow up annually and prn.       After visit summary provided.

## 2015-05-12 NOTE — Patient Instructions (Addendum)
EXERCISE AND DIET:  We recommended that you start or continue a regular exercise program for good health. Regular exercise means any activity that makes your heart beat faster and makes you sweat.  We recommend exercising at least 30 minutes per day at least 3 days a week, preferably 4 or 5.  We also recommend a diet low in fat and sugar.  Inactivity, poor dietary choices and obesity can cause diabetes, heart attack, stroke, and kidney damage, among others.    ALCOHOL AND SMOKING:  Women should limit their alcohol intake to no more than 7 drinks/beers/glasses of wine (combined, not each!) per week. Moderation of alcohol intake to this level decreases your risk of breast cancer and liver damage. And of course, no recreational drugs are part of a healthy lifestyle.  And absolutely no smoking or even second hand smoke. Most people know smoking can cause heart and lung diseases, but did you know it also contributes to weakening of your bones? Aging of your skin?  Yellowing of your teeth and nails?  CALCIUM AND VITAMIN D:  Adequate intake of calcium and Vitamin D are recommended.  The recommendations for exact amounts of these supplements seem to change often, but generally speaking 600 mg of calcium (either carbonate or citrate) and 800 units of Vitamin D per day seems prudent. Certain women may benefit from higher intake of Vitamin D.  If you are among these women, your doctor will have told you during your visit.    PAP SMEARS:  Pap smears, to check for cervical cancer or precancers,  have traditionally been done yearly, although recent scientific advances have shown that most women can have pap smears less often.  However, every woman still should have a physical exam from her gynecologist every year. It will include a breast check, inspection of the vulva and vagina to check for abnormal growths or skin changes, a visual exam of the cervix, and then an exam to evaluate the size and shape of the uterus and  ovaries.  And after 80 years of age, a rectal exam is indicated to check for rectal cancers. We will also provide age appropriate advice regarding health maintenance, like when you should have certain vaccines, screening for sexually transmitted diseases, bone density testing, colonoscopy, mammograms, etc.   MAMMOGRAMS:  All women over 40 years old should have a yearly mammogram. Many facilities now offer a "3D" mammogram, which may cost around $50 extra out of pocket. If possible,  we recommend you accept the option to have the 3D mammogram performed.  It both reduces the number of women who will be called back for extra views which then turn out to be normal, and it is better than the routine mammogram at detecting truly abnormal areas.    COLONOSCOPY:  Colonoscopy to screen for colon cancer is recommended for all women at age 50.  We know, you hate the idea of the prep.  We agree, BUT, having colon cancer and not knowing it is worse!!  Colon cancer so often starts as a polyp that can be seen and removed at colonscopy, which can quite literally save your life!  And if your first colonoscopy is normal and you have no family history of colon cancer, most women don't have to have it again for 10 years.  Once every ten years, you can do something that may end up saving your life, right?  We will be happy to help you get it scheduled when you are ready.    Be sure to check your insurance coverage so you understand how much it will cost.  It may be covered as a preventative service at no cost, but you should check your particular policy.       Denosumab injection What is this medicine? DENOSUMAB (den oh sue mab) slows bone breakdown. Prolia is used to treat osteoporosis in women after menopause and in men. Delton See is used to prevent bone fractures and other bone problems caused by cancer bone metastases. Delton See is also used to treat giant cell tumor of the bone. This medicine may be used for other purposes; ask  your health care provider or pharmacist if you have questions. What should I tell my health care provider before I take this medicine? They need to know if you have any of these conditions: -dental disease -eczema -infection or history of infections -kidney disease or on dialysis -low blood calcium or vitamin D -malabsorption syndrome -scheduled to have surgery or tooth extraction -taking medicine that contains denosumab -thyroid or parathyroid disease -an unusual reaction to denosumab, other medicines, foods, dyes, or preservatives -pregnant or trying to get pregnant -breast-feeding How should I use this medicine? This medicine is for injection under the skin. It is given by a health care professional in a hospital or clinic setting. If you are getting Prolia, a special MedGuide will be given to you by the pharmacist with each prescription and refill. Be sure to read this information carefully each time. For Prolia, talk to your pediatrician regarding the use of this medicine in children. Special care may be needed. For Delton See, talk to your pediatrician regarding the use of this medicine in children. While this drug may be prescribed for children as young as 13 years for selected conditions, precautions do apply. Overdosage: If you think you have taken too much of this medicine contact a poison control center or emergency room at once. NOTE: This medicine is only for you. Do not share this medicine with others. What if I miss a dose? It is important not to miss your dose. Call your doctor or health care professional if you are unable to keep an appointment. What may interact with this medicine? Do not take this medicine with any of the following medications: -other medicines containing denosumab This medicine may also interact with the following medications: -medicines that suppress the immune system -medicines that treat cancer -steroid medicines like prednisone or cortisone This list  may not describe all possible interactions. Give your health care provider a list of all the medicines, herbs, non-prescription drugs, or dietary supplements you use. Also tell them if you smoke, drink alcohol, or use illegal drugs. Some items may interact with your medicine. What should I watch for while using this medicine? Visit your doctor or health care professional for regular checks on your progress. Your doctor or health care professional may order blood tests and other tests to see how you are doing. Call your doctor or health care professional if you get a cold or other infection while receiving this medicine. Do not treat yourself. This medicine may decrease your body's ability to fight infection. You should make sure you get enough calcium and vitamin D while you are taking this medicine, unless your doctor tells you not to. Discuss the foods you eat and the vitamins you take with your health care professional. See your dentist regularly. Brush and floss your teeth as directed. Before you have any dental work done, tell your dentist you are receiving this  medicine. Do not become pregnant while taking this medicine or for 5 months after stopping it. Women should inform their doctor if they wish to become pregnant or think they might be pregnant. There is a potential for serious side effects to an unborn child. Talk to your health care professional or pharmacist for more information. What side effects may I notice from receiving this medicine? Side effects that you should report to your doctor or health care professional as soon as possible: -allergic reactions like skin rash, itching or hives, swelling of the face, lips, or tongue -breathing problems -chest pain -fast, irregular heartbeat -feeling faint or lightheaded, falls -fever, chills, or any other sign of infection -muscle spasms, tightening, or twitches -numbness or tingling -skin blisters or bumps, or is dry, peels, or red -slow  healing or unexplained pain in the mouth or jaw -unusual bleeding or bruising Side effects that usually do not require medical attention (Report these to your doctor or health care professional if they continue or are bothersome.): -muscle pain -stomach upset, gas This list may not describe all possible side effects. Call your doctor for medical advice about side effects. You may report side effects to FDA at 1-800-FDA-1088. Where should I keep my medicine? This medicine is only given in a clinic, doctor's office, or other health care setting and will not be stored at home. NOTE: This sheet is a summary. It may not cover all possible information. If you have questions about this medicine, talk to your doctor, pharmacist, or health care provider.    2016, Elsevier/Gold Standard. (2011-07-02 12:37:47)  Zoledronic Acid injection (Paget's Disease, Osteoporosis) What is this medicine? ZOLEDRONIC ACID (ZOE le dron ik AS id) lowers the amount of calcium loss from bone. It is used to treat Paget's disease and osteoporosis in women. This medicine may be used for other purposes; ask your health care provider or pharmacist if you have questions. What should I tell my health care provider before I take this medicine? They need to know if you have any of these conditions: -aspirin-sensitive asthma -cancer, especially if you are receiving medicines used to treat cancer -dental disease or wear dentures -infection -kidney disease -low levels of calcium in the blood -past surgery on the parathyroid gland or intestines -receiving corticosteroids like dexamethasone or prednisone -an unusual or allergic reaction to zoledronic acid, other medicines, foods, dyes, or preservatives -pregnant or trying to get pregnant -breast-feeding How should I use this medicine? This medicine is for infusion into a vein. It is given by a health care professional in a hospital or clinic setting. Talk to your pediatrician  regarding the use of this medicine in children. This medicine is not approved for use in children. Overdosage: If you think you have taken too much of this medicine contact a poison control center or emergency room at once. NOTE: This medicine is only for you. Do not share this medicine with others. What if I miss a dose? It is important not to miss your dose. Call your doctor or health care professional if you are unable to keep an appointment. What may interact with this medicine? -certain antibiotics given by injection -NSAIDs, medicines for pain and inflammation, like ibuprofen or naproxen -some diuretics like bumetanide, furosemide -teriparatide This list may not describe all possible interactions. Give your health care provider a list of all the medicines, herbs, non-prescription drugs, or dietary supplements you use. Also tell them if you smoke, drink alcohol, or use illegal drugs. Some items may  interact with your medicine. What should I watch for while using this medicine? Visit your doctor or health care professional for regular checkups. It may be some time before you see the benefit from this medicine. Do not stop taking your medicine unless your doctor tells you to. Your doctor may order blood tests or other tests to see how you are doing. Women should inform their doctor if they wish to become pregnant or think they might be pregnant. There is a potential for serious side effects to an unborn child. Talk to your health care professional or pharmacist for more information. You should make sure that you get enough calcium and vitamin D while you are taking this medicine. Discuss the foods you eat and the vitamins you take with your health care professional. Some people who take this medicine have severe bone, joint, and/or muscle pain. This medicine may also increase your risk for jaw problems or a broken thigh bone. Tell your doctor right away if you have severe pain in your jaw, bones,  joints, or muscles. Tell your doctor if you have any pain that does not go away or that gets worse. Tell your dentist and dental surgeon that you are taking this medicine. You should not have major dental surgery while on this medicine. See your dentist to have a dental exam and fix any dental problems before starting this medicine. Take good care of your teeth while on this medicine. Make sure you see your dentist for regular follow-up appointments. What side effects may I notice from receiving this medicine? Side effects that you should report to your doctor or health care professional as soon as possible: -allergic reactions like skin rash, itching or hives, swelling of the face, lips, or tongue -anxiety, confusion, or depression -breathing problems -changes in vision -eye pain -feeling faint or lightheaded, falls -jaw pain, especially after dental work -mouth sores -muscle cramps, stiffness, or weakness -redness, blistering, peeling or loosening of the skin, including inside the mouth -trouble passing urine or change in the amount of urine Side effects that usually do not require medical attention (report to your doctor or health care professional if they continue or are bothersome): -bone, joint, or muscle pain -constipation -diarrhea -fever -hair loss -irritation at site where injected -loss of appetite -nausea, vomiting -stomach upset -trouble sleeping -trouble swallowing -weak or tired This list may not describe all possible side effects. Call your doctor for medical advice about side effects. You may report side effects to FDA at 1-800-FDA-1088. Where should I keep my medicine? This drug is given in a hospital or clinic and will not be stored at home. NOTE: This sheet is a summary. It may not cover all possible information. If you have questions about this medicine, talk to your doctor, pharmacist, or health care provider.    2016, Elsevier/Gold Standard. (2013-05-30  14:19:57)  Raloxifene tablets What is this medicine? RALOXIFENE (ral OX i feen) reduces the amount of calcium lost from bones. It is used to treat and prevent osteoporosis in women who have experienced menopause. This medicine may be used for other purposes; ask your health care provider or pharmacist if you have questions. What should I tell my health care provider before I take this medicine? They need to know if you have any of these conditions: -a history of blood clots -cancer -heart failure -liver disease -premenopausal -an unusual or allergic reaction to raloxifene, other medicines, foods, dyes, or preservatives -pregnant or trying to get pregnant -breast-feeding How  should I use this medicine? Take this medicine by mouth with a glass of water. Follow the directions on the prescription label. The tablets can be taken with or without food. Take your doses at regular intervals. Do not take your medicine more often than directed. Talk to your pediatrician regarding the use of this medicine in children. Special care may be needed. Overdosage: If you think you have taken too much of this medicine contact a poison control center or emergency room at once. NOTE: This medicine is only for you. Do not share this medicine with others. What if I miss a dose? If you miss a dose, take it as soon as you can. If it is almost time for your next dose, take only that dose. Do not take double or extra doses. What may interact with this medicine? -ampicillin -cholestyramine -colestipol -diazepam -diazoxide -female hormones like hormone replacement therapy -lidocaine -warfarin This list may not describe all possible interactions. Give your health care provider a list of all the medicines, herbs, non-prescription drugs, or dietary supplements you use. Also tell them if you smoke, drink alcohol, or use illegal drugs. Some items may interact with your medicine. What should I watch for while using  this medicine? Visit your doctor or health care professional for regular checks on your progress. Do not stop taking this medicine except on the advice of your doctor or health care professional. You should make sure you get enough calcium and vitamin D in your diet while you are taking this medicine. Discuss your dietary needs with your health care professional or nutritionist. Exercise may help to prevent bone loss. Discuss your exercise needs with your doctor or health care professional. This medicine can rarely cause blood clots. You should avoid long periods of bed rest while taking this medicine. If you are going to have surgery, tell your doctor or health care professional that you are taking this medicine. This medicine should be stopped at least 3 days before surgery. After surgery, it should be restarted only after you are walking again. It should not be restarted while you still need long periods of bed rest. You should not smoke while taking this medicine. Smoking may also increase your risk of blood clots. Smoking can also decrease the effects of this medicine. This medicine does not prevent hot flashes. It may cause hot flashes in some patients at the start of therapy. What side effects may I notice from receiving this medicine? Side effects that you should report to your doctor or health care professional as soon as possible: -change in vision -chest pain -difficulty breathing -leg pain or swelling -skin rash, itching Side effects that usually do not require medical attention (report to your doctor or health care professional if they continue or are bothersome): -fluid build-up -leg cramps -stomach pain -sweating This list may not describe all possible side effects. Call your doctor for medical advice about side effects. You may report side effects to FDA at 1-800-FDA-1088. Where should I keep my medicine? Keep out of the reach of children. Store at room temperature between 15  and 30 degrees C (59 and 86 degrees F). Throw away any unused medicine after the expiration date. NOTE: This sheet is a summary. It may not cover all possible information. If you have questions about this medicine, talk to your doctor, pharmacist, or health care provider.    2016, [employer]vier/Gold Standard. (2007-12-18 15:15:14)  Ibandronate monthly tablets What is this medicine? IBANDRONATE (i BAN droh nate) slows  calcium loss from bones. It is used to treat osteoporosis in women past the age of menopause. This medicine may be used for other purposes; ask your health care provider or pharmacist if you have questions. What should I tell my health care provider before I take this medicine? They need to know if you have any of these conditions: -dental disease -esophageal, stomach, or intestine problems, like acid reflux or GERD -kidney disease -low blood calcium -low vitamin D -problems sitting or standing for 60 minutes -trouble swallowing -an unusual or allergic reaction to ibandronate, other medicines, foods, dyes, or preservatives -pregnant or trying to get pregnant -breast-feeding How should I use this medicine? You must take this medicine exactly as directed or you will lower the amount of medicine you absorb into your body or you may cause yourself harm. Take your dose by mouth first thing in the morning, after you are up for the day. Do not eat or drink anything before you take this medicine. Swallow the tablet with a full glass (6 to 8 ounces) of plain water. Do not take this medicine with any other drink. Do not chew or crush the tablet. After taking this medicine, do not eat breakfast, drink, or take any other medicines or vitamins for at least 1 hour. Stand or sit up for at least 1 hour after taking this medicine. Do not lie down. Take this medicine on the same day every month. Do not take your medicine more often than directed. Talk to your pediatrician regarding the use of this  medicine in children. Special care may be needed. Overdosage: If you think you have taken too much of this medicine contact a poison control center or emergency room at once. NOTE: This medicine is only for you. Do not share this medicine with others. What if I miss a dose? If you miss a dose and your next dose is more than 7 days away then take the missed dose on the next morning you remember. Then take your next dose on your regular day of the month. If your next dose is due within the next 7 days then skip the missed dose. Do not take 2 tablets within 1 week of each other. Do not take double or extra doses. What may interact with this medicine? -aluminum hydroxide -antacids -aspirin -calcium supplements -drugs for inflammation like ibuprofen, naproxen, and others -iron supplements -magnesium supplements -vitamins with minerals This list may not describe all possible interactions. Give your health care provider a list of all the medicines, herbs, non-prescription drugs, or dietary supplements you use. Also tell them if you smoke, drink alcohol, or use illegal drugs. Some items may interact with your medicine. What should I watch for while using this medicine? Visit your doctor or health care professional for regular check ups. It may be some time before you see the benefit from this medicine. Do not stop taking your medicine unless your doctor tells you to. Your doctor may order blood tests and other tests to see how you are doing. You should make sure that you get enough calcium and vitamin D while you are taking this medicine. Discuss the foods you eat and the vitamins you take with your health care professional. Some people who take this medicine have severe bone, joint, and/or muscle pain. This medicine may also increase your risk for a broken thigh bone. Tell your doctor right away if you have pain in your upper leg or groin. Tell your doctor if you have  any pain that does not go away or  that gets worse. What side effects may I notice from receiving this medicine? Side effects that you should report to your doctor or health care professional as soon as possible: -allergic reactions such as skin rash or itching, hives, swelling of the face, lips, throat or tongue -black or tarry stools -change in vision -chest pain -heartburn or stomach pain -jaw pain, especially after dental work -redness, blistering, peeling, or loosening of the skin, including inside the mouth -trouble or pain when swallowing Side effects that usually do not require medical attention (report to your doctor or health care professional if they continue or are bothersome): -bone, muscle or joint pain -changes in taste -diarrhea or constipation -headache -nausea or vomiting -stomach gas or fullness This list may not describe all possible side effects. Call your doctor for medical advice about side effects. You may report side effects to FDA at 1-800-FDA-1088. Where should I keep my medicine? Keep out of the reach of children. Store at room temperature between 15 and 30 degrees C (59 and 86 degrees F). Throw away any unused medication after the expiration date. NOTE: This sheet is a summary. It may not cover all possible information. If you have questions about this medicine, talk to your doctor, pharmacist, or health care provider.    2016, [employer] Standard. (2010-06-30 09:03:09)

## 2015-06-29 DIAGNOSIS — H401111 Primary open-angle glaucoma, right eye, mild stage: Secondary | ICD-10-CM | POA: Diagnosis not present

## 2015-07-26 DIAGNOSIS — E039 Hypothyroidism, unspecified: Secondary | ICD-10-CM | POA: Diagnosis not present

## 2015-10-12 DIAGNOSIS — E039 Hypothyroidism, unspecified: Secondary | ICD-10-CM | POA: Diagnosis not present

## 2015-10-12 DIAGNOSIS — E559 Vitamin D deficiency, unspecified: Secondary | ICD-10-CM | POA: Diagnosis not present

## 2015-10-12 DIAGNOSIS — E7800 Pure hypercholesterolemia, unspecified: Secondary | ICD-10-CM | POA: Diagnosis not present

## 2015-10-12 DIAGNOSIS — E119 Type 2 diabetes mellitus without complications: Secondary | ICD-10-CM | POA: Diagnosis not present

## 2015-10-12 DIAGNOSIS — G4730 Sleep apnea, unspecified: Secondary | ICD-10-CM | POA: Diagnosis not present

## 2015-10-12 DIAGNOSIS — Z23 Encounter for immunization: Secondary | ICD-10-CM | POA: Diagnosis not present

## 2015-10-12 DIAGNOSIS — I10 Essential (primary) hypertension: Secondary | ICD-10-CM | POA: Diagnosis not present

## 2015-10-12 DIAGNOSIS — M1990 Unspecified osteoarthritis, unspecified site: Secondary | ICD-10-CM | POA: Diagnosis not present

## 2015-10-19 DIAGNOSIS — H401111 Primary open-angle glaucoma, right eye, mild stage: Secondary | ICD-10-CM | POA: Diagnosis not present

## 2015-11-17 DIAGNOSIS — H401111 Primary open-angle glaucoma, right eye, mild stage: Secondary | ICD-10-CM | POA: Diagnosis not present

## 2015-11-28 DIAGNOSIS — H903 Sensorineural hearing loss, bilateral: Secondary | ICD-10-CM | POA: Diagnosis not present

## 2015-12-22 DIAGNOSIS — H401111 Primary open-angle glaucoma, right eye, mild stage: Secondary | ICD-10-CM | POA: Diagnosis not present

## 2015-12-22 DIAGNOSIS — H1859 Other hereditary corneal dystrophies: Secondary | ICD-10-CM | POA: Diagnosis not present

## 2015-12-28 DIAGNOSIS — M1711 Unilateral primary osteoarthritis, right knee: Secondary | ICD-10-CM | POA: Diagnosis not present

## 2016-02-17 DIAGNOSIS — M1711 Unilateral primary osteoarthritis, right knee: Secondary | ICD-10-CM | POA: Diagnosis not present

## 2016-02-23 DIAGNOSIS — M1711 Unilateral primary osteoarthritis, right knee: Secondary | ICD-10-CM | POA: Diagnosis not present

## 2016-03-01 DIAGNOSIS — E119 Type 2 diabetes mellitus without complications: Secondary | ICD-10-CM | POA: Diagnosis not present

## 2016-03-01 DIAGNOSIS — I10 Essential (primary) hypertension: Secondary | ICD-10-CM | POA: Diagnosis not present

## 2016-03-01 DIAGNOSIS — E7800 Pure hypercholesterolemia, unspecified: Secondary | ICD-10-CM | POA: Diagnosis not present

## 2016-03-01 DIAGNOSIS — Z0000 Encounter for general adult medical examination without abnormal findings: Secondary | ICD-10-CM | POA: Diagnosis not present

## 2016-03-02 DIAGNOSIS — M1711 Unilateral primary osteoarthritis, right knee: Secondary | ICD-10-CM | POA: Diagnosis not present

## 2016-03-29 DIAGNOSIS — Z85038 Personal history of other malignant neoplasm of large intestine: Secondary | ICD-10-CM | POA: Diagnosis not present

## 2016-03-29 DIAGNOSIS — R1012 Left upper quadrant pain: Secondary | ICD-10-CM | POA: Diagnosis not present

## 2016-03-29 DIAGNOSIS — K5190 Ulcerative colitis, unspecified, without complications: Secondary | ICD-10-CM | POA: Diagnosis not present

## 2016-04-19 DIAGNOSIS — Z85038 Personal history of other malignant neoplasm of large intestine: Secondary | ICD-10-CM | POA: Diagnosis not present

## 2016-05-03 DIAGNOSIS — H401111 Primary open-angle glaucoma, right eye, mild stage: Secondary | ICD-10-CM | POA: Diagnosis not present

## 2016-05-07 DIAGNOSIS — M810 Age-related osteoporosis without current pathological fracture: Secondary | ICD-10-CM | POA: Diagnosis not present

## 2016-05-07 DIAGNOSIS — Z1231 Encounter for screening mammogram for malignant neoplasm of breast: Secondary | ICD-10-CM | POA: Diagnosis not present

## 2016-05-14 ENCOUNTER — Ambulatory Visit: Payer: Medicare Other | Admitting: Obstetrics and Gynecology

## 2016-05-25 DIAGNOSIS — H401111 Primary open-angle glaucoma, right eye, mild stage: Secondary | ICD-10-CM | POA: Diagnosis not present

## 2016-06-06 DIAGNOSIS — H401121 Primary open-angle glaucoma, left eye, mild stage: Secondary | ICD-10-CM | POA: Diagnosis not present

## 2016-06-06 DIAGNOSIS — H401112 Primary open-angle glaucoma, right eye, moderate stage: Secondary | ICD-10-CM | POA: Diagnosis not present

## 2016-08-19 DIAGNOSIS — M79674 Pain in right toe(s): Secondary | ICD-10-CM | POA: Diagnosis not present

## 2016-08-30 DIAGNOSIS — I10 Essential (primary) hypertension: Secondary | ICD-10-CM | POA: Diagnosis not present

## 2016-08-30 DIAGNOSIS — R0789 Other chest pain: Secondary | ICD-10-CM | POA: Diagnosis not present

## 2016-08-30 DIAGNOSIS — E7800 Pure hypercholesterolemia, unspecified: Secondary | ICD-10-CM | POA: Diagnosis not present

## 2016-08-30 DIAGNOSIS — E119 Type 2 diabetes mellitus without complications: Secondary | ICD-10-CM | POA: Diagnosis not present

## 2016-10-11 DIAGNOSIS — H401122 Primary open-angle glaucoma, left eye, moderate stage: Secondary | ICD-10-CM | POA: Diagnosis not present

## 2016-10-11 DIAGNOSIS — H401112 Primary open-angle glaucoma, right eye, moderate stage: Secondary | ICD-10-CM | POA: Diagnosis not present

## 2016-10-15 DIAGNOSIS — N183 Chronic kidney disease, stage 3 (moderate): Secondary | ICD-10-CM | POA: Diagnosis not present

## 2016-10-15 DIAGNOSIS — I10 Essential (primary) hypertension: Secondary | ICD-10-CM | POA: Diagnosis not present

## 2016-10-15 DIAGNOSIS — Z23 Encounter for immunization: Secondary | ICD-10-CM | POA: Diagnosis not present

## 2016-10-15 DIAGNOSIS — E119 Type 2 diabetes mellitus without complications: Secondary | ICD-10-CM | POA: Diagnosis not present

## 2016-12-05 ENCOUNTER — Ambulatory Visit
Admission: RE | Admit: 2016-12-05 | Discharge: 2016-12-05 | Disposition: A | Discharge status: Home / Self Care | Payer: Medicare Other | Source: Ambulatory Visit | Attending: Family Medicine | Admitting: Family Medicine

## 2016-12-05 ENCOUNTER — Other Ambulatory Visit: Payer: Self-pay | Admitting: Family Medicine

## 2016-12-05 DIAGNOSIS — M25572 Pain in left ankle and joints of left foot: Secondary | ICD-10-CM | POA: Diagnosis not present

## 2016-12-05 DIAGNOSIS — M7989 Other specified soft tissue disorders: Secondary | ICD-10-CM | POA: Diagnosis not present

## 2016-12-05 DIAGNOSIS — R52 Pain, unspecified: Secondary | ICD-10-CM

## 2016-12-20 DIAGNOSIS — H1859 Other hereditary corneal dystrophies: Secondary | ICD-10-CM | POA: Diagnosis not present

## 2016-12-20 DIAGNOSIS — H401112 Primary open-angle glaucoma, right eye, moderate stage: Secondary | ICD-10-CM | POA: Diagnosis not present

## 2017-02-28 DIAGNOSIS — E119 Type 2 diabetes mellitus without complications: Secondary | ICD-10-CM | POA: Diagnosis not present

## 2017-02-28 DIAGNOSIS — E559 Vitamin D deficiency, unspecified: Secondary | ICD-10-CM | POA: Diagnosis not present

## 2017-02-28 DIAGNOSIS — I10 Essential (primary) hypertension: Secondary | ICD-10-CM | POA: Diagnosis not present

## 2017-02-28 DIAGNOSIS — M810 Age-related osteoporosis without current pathological fracture: Secondary | ICD-10-CM | POA: Diagnosis not present

## 2017-04-25 DIAGNOSIS — H401112 Primary open-angle glaucoma, right eye, moderate stage: Secondary | ICD-10-CM | POA: Diagnosis not present

## 2017-04-25 DIAGNOSIS — E119 Type 2 diabetes mellitus without complications: Secondary | ICD-10-CM | POA: Diagnosis not present

## 2017-05-07 DIAGNOSIS — Z85038 Personal history of other malignant neoplasm of large intestine: Secondary | ICD-10-CM | POA: Diagnosis not present

## 2017-05-07 DIAGNOSIS — K5190 Ulcerative colitis, unspecified, without complications: Secondary | ICD-10-CM | POA: Diagnosis not present

## 2017-08-27 DIAGNOSIS — E119 Type 2 diabetes mellitus without complications: Secondary | ICD-10-CM | POA: Diagnosis not present

## 2017-08-27 DIAGNOSIS — Z0000 Encounter for general adult medical examination without abnormal findings: Secondary | ICD-10-CM | POA: Diagnosis not present

## 2017-08-27 DIAGNOSIS — I10 Essential (primary) hypertension: Secondary | ICD-10-CM | POA: Diagnosis not present

## 2017-08-27 DIAGNOSIS — E559 Vitamin D deficiency, unspecified: Secondary | ICD-10-CM | POA: Diagnosis not present

## 2017-10-02 DIAGNOSIS — H1789 Other corneal scars and opacities: Secondary | ICD-10-CM | POA: Diagnosis not present

## 2017-10-02 DIAGNOSIS — H1859 Other hereditary corneal dystrophies: Secondary | ICD-10-CM | POA: Diagnosis not present

## 2017-10-02 DIAGNOSIS — H401111 Primary open-angle glaucoma, right eye, mild stage: Secondary | ICD-10-CM | POA: Diagnosis not present

## 2017-11-14 DIAGNOSIS — Z23 Encounter for immunization: Secondary | ICD-10-CM | POA: Diagnosis not present

## 2018-02-26 DIAGNOSIS — N189 Chronic kidney disease, unspecified: Secondary | ICD-10-CM | POA: Diagnosis not present

## 2018-02-26 DIAGNOSIS — E7800 Pure hypercholesterolemia, unspecified: Secondary | ICD-10-CM | POA: Diagnosis not present

## 2018-02-26 DIAGNOSIS — I10 Essential (primary) hypertension: Secondary | ICD-10-CM | POA: Diagnosis not present

## 2018-02-26 DIAGNOSIS — E1142 Type 2 diabetes mellitus with diabetic polyneuropathy: Secondary | ICD-10-CM | POA: Diagnosis not present

## 2018-03-03 DIAGNOSIS — H1789 Other corneal scars and opacities: Secondary | ICD-10-CM | POA: Diagnosis not present

## 2018-03-03 DIAGNOSIS — H401111 Primary open-angle glaucoma, right eye, mild stage: Secondary | ICD-10-CM | POA: Diagnosis not present

## 2018-03-03 DIAGNOSIS — E119 Type 2 diabetes mellitus without complications: Secondary | ICD-10-CM | POA: Diagnosis not present

## 2018-03-03 DIAGNOSIS — Z961 Presence of intraocular lens: Secondary | ICD-10-CM | POA: Diagnosis not present

## 2018-05-10 ENCOUNTER — Inpatient Hospital Stay (HOSPITAL_COMMUNITY): Payer: Medicare Other | Admitting: Certified Registered"

## 2018-05-10 ENCOUNTER — Other Ambulatory Visit: Payer: Self-pay

## 2018-05-10 ENCOUNTER — Inpatient Hospital Stay (HOSPITAL_COMMUNITY): Payer: Medicare Other

## 2018-05-10 ENCOUNTER — Encounter (HOSPITAL_COMMUNITY): Payer: Self-pay | Admitting: Emergency Medicine

## 2018-05-10 ENCOUNTER — Inpatient Hospital Stay (HOSPITAL_COMMUNITY)
Admission: EM | Admit: 2018-05-10 | Discharge: 2018-05-14 | DRG: 481 | Disposition: A | Discharge status: Rehabilitation Facility | Payer: Medicare Other | Attending: Family Medicine | Admitting: Family Medicine

## 2018-05-10 ENCOUNTER — Emergency Department (HOSPITAL_COMMUNITY): Payer: Medicare Other

## 2018-05-10 ENCOUNTER — Encounter (HOSPITAL_COMMUNITY)
Admission: EM | Disposition: A | Discharge status: Rehabilitation Facility | Payer: Self-pay | Source: Home / Self Care | Attending: Internal Medicine

## 2018-05-10 DIAGNOSIS — M109 Gout, unspecified: Secondary | ICD-10-CM | POA: Diagnosis present

## 2018-05-10 DIAGNOSIS — M8580 Other specified disorders of bone density and structure, unspecified site: Secondary | ICD-10-CM | POA: Diagnosis present

## 2018-05-10 DIAGNOSIS — R52 Pain, unspecified: Secondary | ICD-10-CM | POA: Diagnosis not present

## 2018-05-10 DIAGNOSIS — W010XXA Fall on same level from slipping, tripping and stumbling without subsequent striking against object, initial encounter: Secondary | ICD-10-CM | POA: Diagnosis present

## 2018-05-10 DIAGNOSIS — Z96652 Presence of left artificial knee joint: Secondary | ICD-10-CM | POA: Diagnosis not present

## 2018-05-10 DIAGNOSIS — E662 Morbid (severe) obesity with alveolar hypoventilation: Secondary | ICD-10-CM | POA: Diagnosis present

## 2018-05-10 DIAGNOSIS — Z79899 Other long term (current) drug therapy: Secondary | ICD-10-CM | POA: Diagnosis not present

## 2018-05-10 DIAGNOSIS — Z9049 Acquired absence of other specified parts of digestive tract: Secondary | ICD-10-CM | POA: Diagnosis not present

## 2018-05-10 DIAGNOSIS — Y92009 Unspecified place in unspecified non-institutional (private) residence as the place of occurrence of the external cause: Secondary | ICD-10-CM

## 2018-05-10 DIAGNOSIS — S72144A Nondisplaced intertrochanteric fracture of right femur, initial encounter for closed fracture: Secondary | ICD-10-CM | POA: Diagnosis not present

## 2018-05-10 DIAGNOSIS — R001 Bradycardia, unspecified: Secondary | ICD-10-CM | POA: Diagnosis not present

## 2018-05-10 DIAGNOSIS — Z85038 Personal history of other malignant neoplasm of large intestine: Secondary | ICD-10-CM | POA: Diagnosis not present

## 2018-05-10 DIAGNOSIS — S72001A Fracture of unspecified part of neck of right femur, initial encounter for closed fracture: Secondary | ICD-10-CM

## 2018-05-10 DIAGNOSIS — I10 Essential (primary) hypertension: Secondary | ICD-10-CM | POA: Diagnosis not present

## 2018-05-10 DIAGNOSIS — H409 Unspecified glaucoma: Secondary | ICD-10-CM | POA: Diagnosis not present

## 2018-05-10 DIAGNOSIS — R0989 Other specified symptoms and signs involving the circulatory and respiratory systems: Secondary | ICD-10-CM | POA: Diagnosis not present

## 2018-05-10 DIAGNOSIS — Z880 Allergy status to penicillin: Secondary | ICD-10-CM

## 2018-05-10 DIAGNOSIS — Z91040 Latex allergy status: Secondary | ICD-10-CM

## 2018-05-10 DIAGNOSIS — R000 Tachycardia, unspecified: Secondary | ICD-10-CM | POA: Diagnosis not present

## 2018-05-10 DIAGNOSIS — K529 Noninfective gastroenteritis and colitis, unspecified: Secondary | ICD-10-CM | POA: Diagnosis not present

## 2018-05-10 DIAGNOSIS — E119 Type 2 diabetes mellitus without complications: Secondary | ICD-10-CM

## 2018-05-10 DIAGNOSIS — Z8249 Family history of ischemic heart disease and other diseases of the circulatory system: Secondary | ICD-10-CM | POA: Diagnosis not present

## 2018-05-10 DIAGNOSIS — Z7982 Long term (current) use of aspirin: Secondary | ICD-10-CM

## 2018-05-10 DIAGNOSIS — Z800 Family history of malignant neoplasm of digestive organs: Secondary | ICD-10-CM | POA: Diagnosis not present

## 2018-05-10 DIAGNOSIS — Z6841 Body Mass Index (BMI) 40.0 and over, adult: Secondary | ICD-10-CM

## 2018-05-10 DIAGNOSIS — M7989 Other specified soft tissue disorders: Secondary | ICD-10-CM | POA: Diagnosis not present

## 2018-05-10 DIAGNOSIS — E1169 Type 2 diabetes mellitus with other specified complication: Secondary | ICD-10-CM | POA: Diagnosis not present

## 2018-05-10 DIAGNOSIS — S72009A Fracture of unspecified part of neck of unspecified femur, initial encounter for closed fracture: Secondary | ICD-10-CM | POA: Diagnosis present

## 2018-05-10 DIAGNOSIS — G8918 Other acute postprocedural pain: Secondary | ICD-10-CM | POA: Diagnosis not present

## 2018-05-10 DIAGNOSIS — M1711 Unilateral primary osteoarthritis, right knee: Secondary | ICD-10-CM | POA: Diagnosis not present

## 2018-05-10 DIAGNOSIS — K5180 Other ulcerative colitis without complications: Secondary | ICD-10-CM

## 2018-05-10 DIAGNOSIS — Z79890 Hormone replacement therapy (postmenopausal): Secondary | ICD-10-CM

## 2018-05-10 DIAGNOSIS — M25551 Pain in right hip: Secondary | ICD-10-CM | POA: Diagnosis present

## 2018-05-10 DIAGNOSIS — E039 Hypothyroidism, unspecified: Secondary | ICD-10-CM | POA: Diagnosis not present

## 2018-05-10 DIAGNOSIS — D62 Acute posthemorrhagic anemia: Secondary | ICD-10-CM

## 2018-05-10 DIAGNOSIS — W19XXXA Unspecified fall, initial encounter: Secondary | ICD-10-CM | POA: Diagnosis not present

## 2018-05-10 DIAGNOSIS — S72141A Displaced intertrochanteric fracture of right femur, initial encounter for closed fracture: Secondary | ICD-10-CM | POA: Diagnosis not present

## 2018-05-10 DIAGNOSIS — G629 Polyneuropathy, unspecified: Secondary | ICD-10-CM | POA: Diagnosis not present

## 2018-05-10 DIAGNOSIS — Z1159 Encounter for screening for other viral diseases: Secondary | ICD-10-CM

## 2018-05-10 DIAGNOSIS — S72141D Displaced intertrochanteric fracture of right femur, subsequent encounter for closed fracture with routine healing: Secondary | ICD-10-CM | POA: Diagnosis not present

## 2018-05-10 DIAGNOSIS — S72041A Displaced fracture of base of neck of right femur, initial encounter for closed fracture: Secondary | ICD-10-CM | POA: Diagnosis not present

## 2018-05-10 DIAGNOSIS — I2510 Atherosclerotic heart disease of native coronary artery without angina pectoris: Secondary | ICD-10-CM | POA: Diagnosis not present

## 2018-05-10 DIAGNOSIS — Z8262 Family history of osteoporosis: Secondary | ICD-10-CM | POA: Diagnosis not present

## 2018-05-10 DIAGNOSIS — K5900 Constipation, unspecified: Secondary | ICD-10-CM | POA: Diagnosis not present

## 2018-05-10 DIAGNOSIS — I951 Orthostatic hypotension: Secondary | ICD-10-CM | POA: Diagnosis not present

## 2018-05-10 DIAGNOSIS — E669 Obesity, unspecified: Secondary | ICD-10-CM | POA: Diagnosis not present

## 2018-05-10 DIAGNOSIS — S8991XA Unspecified injury of right lower leg, initial encounter: Secondary | ICD-10-CM | POA: Diagnosis not present

## 2018-05-10 DIAGNOSIS — S299XXA Unspecified injury of thorax, initial encounter: Secondary | ICD-10-CM | POA: Diagnosis not present

## 2018-05-10 DIAGNOSIS — E66813 Obesity, class 3: Secondary | ICD-10-CM

## 2018-05-10 DIAGNOSIS — K5901 Slow transit constipation: Secondary | ICD-10-CM | POA: Diagnosis not present

## 2018-05-10 DIAGNOSIS — K51919 Ulcerative colitis, unspecified with unspecified complications: Secondary | ICD-10-CM | POA: Diagnosis not present

## 2018-05-10 DIAGNOSIS — K5190 Ulcerative colitis, unspecified, without complications: Secondary | ICD-10-CM | POA: Diagnosis not present

## 2018-05-10 HISTORY — PX: INTRAMEDULLARY (IM) NAIL INTERTROCHANTERIC: SHX5875

## 2018-05-10 LAB — COMPREHENSIVE METABOLIC PANEL
ALT: 11 U/L (ref 0–44)
AST: 16 U/L (ref 15–41)
Albumin: 3.8 g/dL (ref 3.5–5.0)
Alkaline Phosphatase: 93 U/L (ref 38–126)
Anion gap: 12 (ref 5–15)
BUN: 13 mg/dL (ref 8–23)
CO2: 22 mmol/L (ref 22–32)
Calcium: 9.1 mg/dL (ref 8.9–10.3)
Chloride: 103 mmol/L (ref 98–111)
Creatinine, Ser: 0.95 mg/dL (ref 0.44–1.00)
GFR calc Af Amer: >60 mL/min (ref >60)
GFR calc non Af Amer: 55 mL/min — ABNORMAL LOW (ref >60)
Glucose, Bld: 168 mg/dL — ABNORMAL HIGH (ref 70–99)
Potassium: 3.9 mmol/L (ref 3.5–5.1)
Sodium: 137 mmol/L (ref 135–145)
Total Bilirubin: 0.7 mg/dL (ref 0.3–1.2)
Total Protein: 7.1 g/dL (ref 6.5–8.1)

## 2018-05-10 LAB — CBC WITH DIFFERENTIAL/PLATELET
Abs Immature Granulocytes: 0.03 10*3/uL (ref 0.00–0.07)
Basophils Absolute: 0 10*3/uL (ref 0.0–0.1)
Basophils Relative: 1 %
Eosinophils Absolute: 0.1 10*3/uL (ref 0.0–0.5)
Eosinophils Relative: 1 %
HCT: 42.2 % (ref 36.0–46.0)
Hemoglobin: 13.4 g/dL (ref 12.0–15.0)
Immature Granulocytes: 1 %
Lymphocytes Relative: 14 %
Lymphs Abs: 0.8 10*3/uL (ref 0.7–4.0)
MCH: 27.9 pg (ref 26.0–34.0)
MCHC: 31.8 g/dL (ref 30.0–36.0)
MCV: 87.7 fL (ref 80.0–100.0)
Monocytes Absolute: 0.3 10*3/uL (ref 0.1–1.0)
Monocytes Relative: 6 %
Neutro Abs: 4.3 10*3/uL (ref 1.7–7.7)
Neutrophils Relative %: 77 %
Platelets: 159 10*3/uL (ref 150–400)
RBC: 4.81 MIL/uL (ref 3.87–5.11)
RDW: 13 % (ref 11.5–15.5)
WBC: 5.5 10*3/uL (ref 4.0–10.5)
nRBC: 0 % (ref 0.0–0.2)

## 2018-05-10 LAB — GLUCOSE, CAPILLARY
Glucose-Capillary: 168 mg/dL — ABNORMAL HIGH (ref 70–99)
Glucose-Capillary: 180 mg/dL — ABNORMAL HIGH (ref 70–99)

## 2018-05-10 SURGERY — FIXATION, FRACTURE, INTERTROCHANTERIC, WITH INTRAMEDULLARY ROD
Anesthesia: General | Site: Hip | Laterality: Right

## 2018-05-10 MED ORDER — LOSARTAN POTASSIUM 50 MG PO TABS
100.0000 mg | ORAL_TABLET | Freq: Every day | ORAL | Status: DC
  Administered 2018-05-11 – 2018-05-14 (×4): 100 mg via ORAL
  Filled 2018-05-10 (×4): qty 2

## 2018-05-10 MED ORDER — 0.9 % SODIUM CHLORIDE (POUR BTL) OPTIME
TOPICAL | Status: DC | PRN
  Administered 2018-05-10: 1000 mL

## 2018-05-10 MED ORDER — METOCLOPRAMIDE HCL 5 MG PO TABS: 5.0000 mg | ORAL_TABLET | Freq: Three times a day (TID) | ORAL | Status: DC | PRN

## 2018-05-10 MED ORDER — SUGAMMADEX SODIUM 200 MG/2ML IV SOLN
INTRAVENOUS | Status: DC | PRN
  Administered 2018-05-10: 200 mg via INTRAVENOUS

## 2018-05-10 MED ORDER — TAFLUPROST (PF) 0.0015 % OP SOLN: 1.0000 [drp] | Freq: Every day | OPHTHALMIC | Status: DC

## 2018-05-10 MED ORDER — BRINZOLAMIDE 1 % OP SUSP
1.0000 [drp] | Freq: Two times a day (BID) | OPHTHALMIC | Status: DC
  Administered 2018-05-10 – 2018-05-14 (×8): 1 [drp] via OPHTHALMIC
  Filled 2018-05-10: qty 10

## 2018-05-10 MED ORDER — LIDOCAINE 2% (20 MG/ML) 5 ML SYRINGE
INTRAMUSCULAR | Status: AC
  Filled 2018-05-10: qty 5

## 2018-05-10 MED ORDER — METHOCARBAMOL 1000 MG/10ML IJ SOLN
500.0000 mg | Freq: Four times a day (QID) | INTRAVENOUS | Status: DC | PRN
  Filled 2018-05-10 (×2): qty 5

## 2018-05-10 MED ORDER — PHENYLEPHRINE 40 MCG/ML (10ML) SYRINGE FOR IV PUSH (FOR BLOOD PRESSURE SUPPORT)
PREFILLED_SYRINGE | INTRAVENOUS | Status: DC | PRN
  Administered 2018-05-10 (×2): 80 ug via INTRAVENOUS
  Administered 2018-05-10: 120 ug via INTRAVENOUS

## 2018-05-10 MED ORDER — METHOCARBAMOL 500 MG PO TABS
ORAL_TABLET | ORAL | Status: AC
  Filled 2018-05-10: qty 1

## 2018-05-10 MED ORDER — GLYCOPYRROLATE PF 0.2 MG/ML IJ SOSY
PREFILLED_SYRINGE | INTRAMUSCULAR | Status: DC | PRN
  Administered 2018-05-10: .2 mg via INTRAVENOUS

## 2018-05-10 MED ORDER — ASPIRIN EC 81 MG PO TBEC
81.0000 mg | DELAYED_RELEASE_TABLET | Freq: Every day | ORAL | Status: DC
  Administered 2018-05-11 – 2018-05-14 (×4): 81 mg via ORAL
  Filled 2018-05-10 (×4): qty 1

## 2018-05-10 MED ORDER — BISACODYL 5 MG PO TBEC: 5.0000 mg | DELAYED_RELEASE_TABLET | Freq: Every day | ORAL | Status: DC | PRN

## 2018-05-10 MED ORDER — ROCURONIUM BROMIDE 50 MG/5ML IV SOSY
PREFILLED_SYRINGE | INTRAVENOUS | Status: AC
  Filled 2018-05-10: qty 5

## 2018-05-10 MED ORDER — METHOCARBAMOL 1000 MG/10ML IJ SOLN
500.0000 mg | Freq: Four times a day (QID) | INTRAVENOUS | Status: DC | PRN
  Filled 2018-05-10: qty 5

## 2018-05-10 MED ORDER — HYDROCODONE-ACETAMINOPHEN 5-325 MG PO TABS
1.0000 | ORAL_TABLET | Freq: Four times a day (QID) | ORAL | Status: DC | PRN
  Administered 2018-05-10 – 2018-05-11 (×4): 2 via ORAL
  Administered 2018-05-11: 1 via ORAL
  Administered 2018-05-12 – 2018-05-13 (×3): 2 via ORAL
  Administered 2018-05-13 – 2018-05-14 (×2): 1 via ORAL
  Filled 2018-05-10 (×4): qty 2
  Filled 2018-05-10: qty 1
  Filled 2018-05-10: qty 2
  Filled 2018-05-10: qty 1
  Filled 2018-05-10 (×2): qty 2

## 2018-05-10 MED ORDER — EPHEDRINE SULFATE-NACL 50-0.9 MG/10ML-% IV SOSY
PREFILLED_SYRINGE | INTRAVENOUS | Status: DC | PRN
  Administered 2018-05-10: 5 mg via INTRAVENOUS
  Administered 2018-05-10 (×2): 2.5 mg via INTRAVENOUS

## 2018-05-10 MED ORDER — EPHEDRINE 5 MG/ML INJ
INTRAVENOUS | Status: AC
  Filled 2018-05-10: qty 10

## 2018-05-10 MED ORDER — CEFAZOLIN SODIUM-DEXTROSE 2-4 GM/100ML-% IV SOLN
INTRAVENOUS | Status: AC
  Filled 2018-05-10: qty 100

## 2018-05-10 MED ORDER — ONDANSETRON HCL 4 MG/2ML IJ SOLN
INTRAMUSCULAR | Status: AC
  Filled 2018-05-10: qty 2

## 2018-05-10 MED ORDER — SODIUM CHLORIDE 0.9 % IV SOLN
INTRAVENOUS | Status: DC | PRN
  Administered 2018-05-10: 17:00:00 30 ug/min via INTRAVENOUS

## 2018-05-10 MED ORDER — HYDROMORPHONE HCL 1 MG/ML IJ SOLN: 0.5000 mg | INTRAMUSCULAR | Status: DC | PRN

## 2018-05-10 MED ORDER — LATANOPROST 0.005 % OP SOLN
1.0000 [drp] | Freq: Every day | OPHTHALMIC | Status: DC
  Administered 2018-05-10: 22:00:00 1 [drp] via OPHTHALMIC
  Filled 2018-05-10: qty 2.5

## 2018-05-10 MED ORDER — FOLIC ACID 1 MG PO TABS
1.0000 mg | ORAL_TABLET | Freq: Every day | ORAL | Status: DC
  Administered 2018-05-11 – 2018-05-14 (×4): 1 mg via ORAL
  Filled 2018-05-10 (×4): qty 1

## 2018-05-10 MED ORDER — AMLODIPINE BESYLATE 10 MG PO TABS
10.0000 mg | ORAL_TABLET | Freq: Every day | ORAL | Status: DC
  Administered 2018-05-11 – 2018-05-14 (×4): 10 mg via ORAL
  Filled 2018-05-10 (×4): qty 1

## 2018-05-10 MED ORDER — SUCCINYLCHOLINE CHLORIDE 200 MG/10ML IV SOSY
PREFILLED_SYRINGE | INTRAVENOUS | Status: DC | PRN
  Administered 2018-05-10: 100 mg via INTRAVENOUS

## 2018-05-10 MED ORDER — POLYETHYLENE GLYCOL 3350 17 G PO PACK: 17.0000 g | PACK | Freq: Every day | ORAL | Status: DC | PRN

## 2018-05-10 MED ORDER — HYDROCODONE-ACETAMINOPHEN 5-325 MG PO TABS
ORAL_TABLET | ORAL | Status: AC
  Filled 2018-05-10: qty 2

## 2018-05-10 MED ORDER — DEXAMETHASONE SODIUM PHOSPHATE 10 MG/ML IJ SOLN
INTRAMUSCULAR | Status: AC
  Filled 2018-05-10: qty 1

## 2018-05-10 MED ORDER — PROPOFOL 10 MG/ML IV BOLUS
INTRAVENOUS | Status: DC | PRN
  Administered 2018-05-10 (×2): 100 mg via INTRAVENOUS

## 2018-05-10 MED ORDER — METHOCARBAMOL 500 MG PO TABS
500.0000 mg | ORAL_TABLET | Freq: Four times a day (QID) | ORAL | Status: DC | PRN
  Administered 2018-05-10 – 2018-05-12 (×5): 500 mg via ORAL
  Filled 2018-05-10 (×5): qty 1

## 2018-05-10 MED ORDER — ACETAMINOPHEN 10 MG/ML IV SOLN: 1000.0000 mg | Freq: Once | INTRAVENOUS | Status: DC | PRN

## 2018-05-10 MED ORDER — HYDROMORPHONE HCL 1 MG/ML IJ SOLN
0.5000 mg | Freq: Once | INTRAMUSCULAR | Status: AC
  Administered 2018-05-10: 0.5 mg via INTRAVENOUS
  Filled 2018-05-10: qty 1

## 2018-05-10 MED ORDER — DEXAMETHASONE SODIUM PHOSPHATE 10 MG/ML IJ SOLN
INTRAMUSCULAR | Status: DC | PRN
  Administered 2018-05-10: 10 mg via INTRAVENOUS

## 2018-05-10 MED ORDER — ONDANSETRON HCL 4 MG/2ML IJ SOLN: 4.0000 mg | Freq: Four times a day (QID) | INTRAMUSCULAR | Status: DC | PRN

## 2018-05-10 MED ORDER — LEVOTHYROXINE SODIUM 75 MCG PO TABS
150.0000 ug | ORAL_TABLET | Freq: Every day | ORAL | Status: DC
  Administered 2018-05-11 – 2018-05-14 (×4): 150 ug via ORAL
  Filled 2018-05-10 (×4): qty 2

## 2018-05-10 MED ORDER — BRIMONIDINE TARTRATE 0.15 % OP SOLN
1.0000 [drp] | Freq: Two times a day (BID) | OPHTHALMIC | Status: DC
  Administered 2018-05-10 – 2018-05-14 (×8): 1 [drp] via OPHTHALMIC
  Filled 2018-05-10: qty 5

## 2018-05-10 MED ORDER — ONDANSETRON HCL 4 MG/2ML IJ SOLN: 4.0000 mg | Freq: Once | INTRAMUSCULAR | Status: DC | PRN

## 2018-05-10 MED ORDER — MORPHINE SULFATE (PF) 2 MG/ML IV SOLN
0.5000 mg | INTRAVENOUS | Status: DC | PRN
  Administered 2018-05-10 – 2018-05-11 (×2): 0.5 mg via INTRAVENOUS
  Filled 2018-05-10 (×3): qty 1

## 2018-05-10 MED ORDER — FENTANYL CITRATE (PF) 250 MCG/5ML IJ SOLN
INTRAMUSCULAR | Status: DC | PRN
  Administered 2018-05-10 (×2): 25 ug via INTRAVENOUS
  Administered 2018-05-10 (×3): 50 ug via INTRAVENOUS
  Administered 2018-05-10: 25 ug via INTRAVENOUS
  Administered 2018-05-10: 50 ug via INTRAVENOUS
  Administered 2018-05-10: 25 ug via INTRAVENOUS
  Administered 2018-05-10: 50 ug via INTRAVENOUS

## 2018-05-10 MED ORDER — PROPOFOL 10 MG/ML IV BOLUS
INTRAVENOUS | Status: AC
  Filled 2018-05-10: qty 20

## 2018-05-10 MED ORDER — LACTATED RINGERS IV SOLN
INTRAVENOUS | Status: DC | PRN
  Administered 2018-05-10 (×2): via INTRAVENOUS

## 2018-05-10 MED ORDER — FENTANYL CITRATE (PF) 100 MCG/2ML IJ SOLN
25.0000 ug | INTRAMUSCULAR | Status: DC | PRN
  Administered 2018-05-10 (×2): 25 ug via INTRAVENOUS
  Administered 2018-05-10: 19:00:00 50 ug via INTRAVENOUS
  Administered 2018-05-10 (×2): 25 ug via INTRAVENOUS

## 2018-05-10 MED ORDER — PHENYLEPHRINE 40 MCG/ML (10ML) SYRINGE FOR IV PUSH (FOR BLOOD PRESSURE SUPPORT)
PREFILLED_SYRINGE | INTRAVENOUS | Status: AC
  Filled 2018-05-10: qty 10

## 2018-05-10 MED ORDER — DOCUSATE SODIUM 100 MG PO CAPS
100.0000 mg | ORAL_CAPSULE | Freq: Two times a day (BID) | ORAL | Status: DC
  Administered 2018-05-10 – 2018-05-14 (×8): 100 mg via ORAL
  Filled 2018-05-10 (×8): qty 1

## 2018-05-10 MED ORDER — DOCUSATE SODIUM 100 MG PO CAPS: 100.0000 mg | ORAL_CAPSULE | Freq: Two times a day (BID) | ORAL | Status: DC

## 2018-05-10 MED ORDER — LIDOCAINE 2% (20 MG/ML) 5 ML SYRINGE
INTRAMUSCULAR | Status: DC | PRN
  Administered 2018-05-10: 60 mg via INTRAVENOUS

## 2018-05-10 MED ORDER — FENTANYL CITRATE (PF) 100 MCG/2ML IJ SOLN
INTRAMUSCULAR | Status: AC
  Filled 2018-05-10: qty 2

## 2018-05-10 MED ORDER — CEFAZOLIN SODIUM-DEXTROSE 2-3 GM-%(50ML) IV SOLR
INTRAVENOUS | Status: DC | PRN
  Administered 2018-05-10: 2 g via INTRAVENOUS

## 2018-05-10 MED ORDER — FENTANYL CITRATE (PF) 250 MCG/5ML IJ SOLN
INTRAMUSCULAR | Status: AC
  Filled 2018-05-10: qty 5

## 2018-05-10 MED ORDER — SUCCINYLCHOLINE CHLORIDE 200 MG/10ML IV SOSY
PREFILLED_SYRINGE | INTRAVENOUS | Status: AC
  Filled 2018-05-10: qty 10

## 2018-05-10 MED ORDER — ONDANSETRON HCL 4 MG PO TABS: 4.0000 mg | ORAL_TABLET | Freq: Four times a day (QID) | ORAL | Status: DC | PRN

## 2018-05-10 MED ORDER — METHOCARBAMOL 500 MG PO TABS: 500.0000 mg | ORAL_TABLET | Freq: Four times a day (QID) | ORAL | Status: DC | PRN

## 2018-05-10 MED ORDER — ONDANSETRON HCL 4 MG/2ML IJ SOLN
INTRAMUSCULAR | Status: DC | PRN
  Administered 2018-05-10: 4 mg via INTRAVENOUS

## 2018-05-10 MED ORDER — METOCLOPRAMIDE HCL 5 MG/ML IJ SOLN: 5.0000 mg | Freq: Three times a day (TID) | INTRAMUSCULAR | Status: DC | PRN

## 2018-05-10 MED ORDER — ROCURONIUM BROMIDE 10 MG/ML (PF) SYRINGE
PREFILLED_SYRINGE | INTRAVENOUS | Status: DC | PRN
  Administered 2018-05-10: 10 mg via INTRAVENOUS
  Administered 2018-05-10: 50 mg via INTRAVENOUS
  Administered 2018-05-10: 10 mg via INTRAVENOUS

## 2018-05-10 MED ORDER — SULFASALAZINE 500 MG PO TABS
1000.0000 mg | ORAL_TABLET | Freq: Two times a day (BID) | ORAL | Status: DC
  Administered 2018-05-10 – 2018-05-14 (×8): 1000 mg via ORAL
  Filled 2018-05-10 (×9): qty 2

## 2018-05-10 SURGICAL SUPPLY — 37 items
ALCOHOL 70% 16 OZ (MISCELLANEOUS) ×2 IMPLANT
BIT DRILL FLUTED FEMUR 4.2/3 (BIT) ×1 IMPLANT
BNDG COHESIVE 6X5 TAN STRL LF (GAUZE/BANDAGES/DRESSINGS) ×5 IMPLANT
CANISTER SUCTION WELLS/JOHNSON (MISCELLANEOUS) ×1 IMPLANT
COVER PERINEAL POST (MISCELLANEOUS) ×2 IMPLANT
COVER SURGICAL LIGHT HANDLE (MISCELLANEOUS) ×2 IMPLANT
COVER WAND RF STERILE (DRAPES) ×2 IMPLANT
DRAPE HALF SHEET 40X57 (DRAPES) IMPLANT
DRAPE INCISE IOBAN 66X45 STRL (DRAPES) ×2 IMPLANT
DRAPE STERI IOBAN 125X83 (DRAPES) ×2 IMPLANT
DRSG ADAPTIC 3X8 NADH LF (GAUZE/BANDAGES/DRESSINGS) ×2 IMPLANT
DURAPREP 26ML APPLICATOR (WOUND CARE) ×1 IMPLANT
ELECT CAUTERY BLADE 6.4 (BLADE) ×1 IMPLANT
ELECT REM PT RETURN 9FT ADLT (ELECTROSURGICAL) ×2
ELECTRODE REM PT RTRN 9FT ADLT (ELECTROSURGICAL) ×1 IMPLANT
GAUZE SPONGE 4X4 12PLY STRL LF (GAUZE/BANDAGES/DRESSINGS) ×2 IMPLANT
GLOVE BIO SURGEON STRL SZ7.5 (GLOVE) ×2 IMPLANT
GLOVE BIOGEL PI IND STRL 8 (GLOVE) ×1 IMPLANT
GLOVE BIOGEL PI INDICATOR 8 (GLOVE) ×1
GOWN STRL REUS W/ TWL LRG LVL3 (GOWN DISPOSABLE) ×1 IMPLANT
GOWN STRL REUS W/ TWL XL LVL3 (GOWN DISPOSABLE) ×1 IMPLANT
GOWN STRL REUS W/TWL LRG LVL3 (GOWN DISPOSABLE)
GOWN STRL REUS W/TWL XL LVL3 (GOWN DISPOSABLE) ×4
GUIDEWIRE 3.2X400 (WIRE) ×1 IMPLANT
KIT BASIN OR (CUSTOM PROCEDURE TRAY) ×2 IMPLANT
KIT TURNOVER KIT B (KITS) ×2 IMPLANT
NAIL TROCH FIX 10X235 RT 130 (Nail) ×1 IMPLANT
NS IRRIG 1000ML POUR BTL (IV SOLUTION) ×2 IMPLANT
PACK GENERAL/GYN (CUSTOM PROCEDURE TRAY) ×2 IMPLANT
PAD ARMBOARD 7.5X6 YLW CONV (MISCELLANEOUS) ×6 IMPLANT
SCREW LOCKING 5.0X38MM (Screw) ×1 IMPLANT
SCREW TFNA 105MM STERILE (Screw) ×1 IMPLANT
STAPLER VISISTAT 35W (STAPLE) ×2 IMPLANT
SUT MON AB 2-0 CT1 36 (SUTURE) ×2 IMPLANT
TOWEL OR 17X24 6PK STRL BLUE (TOWEL DISPOSABLE) ×2 IMPLANT
TOWEL OR 17X26 10 PK STRL BLUE (TOWEL DISPOSABLE) ×2 IMPLANT
WATER STERILE IRR 1000ML POUR (IV SOLUTION) ×1 IMPLANT

## 2018-05-10 NOTE — Transfer of Care (Signed)
Immediate Anesthesia Transfer of Care Note  Patient: Jenna Chambers  Procedure(s) Performed: INTRAMEDULLARY (IM) NAIL INTERTROCHANTRIC (Right Hip)  Patient Location: PACU  Anesthesia Type:General  Level of Consciousness: drowsy, patient cooperative and responds to stimulation  Airway & Oxygen Therapy: Patient Spontanous Breathing and Patient connected to face mask oxygen  Post-op Assessment: Report given to RN and Post -op Vital signs reviewed and stable  Post vital signs: Reviewed and stable  Last Vitals:  Vitals Value Taken Time  BP 151/49 05/10/2018  5:49 PM  Temp    Pulse 82 05/10/2018  5:49 PM  Resp 16 05/10/2018  5:49 PM  SpO2 100 % 05/10/2018  5:49 PM  Vitals shown include unvalidated device data.  Last Pain:  Vitals:   05/10/18 1418  TempSrc:   PainSc: 7          Complications: No apparent anesthesia complications

## 2018-05-10 NOTE — Brief Op Note (Signed)
05/10/2018  5:40 PM  PATIENT:  Jenna Chambers  85 y.o. female  PRE-OPERATIVE DIAGNOSIS:  hip fracture right  POST-OPERATIVE DIAGNOSIS:  hip fracture right  PROCEDURE:  Procedure(s): INTRAMEDULLARY (IM) NAIL INTERTROCHANTRIC (Right)  SURGEON:  Surgeon(s) and Role:    * [name], MD - Primary  PHYSICIAN ASSISTANT:   ASSISTANTS: none   ANESTHESIA:   general  EBL:  100 mL   BLOOD ADMINISTERED:none  DRAINS: none   LOCAL MEDICATIONS USED:  NONE  SPECIMEN:  No Specimen  DISPOSITION OF SPECIMEN:  N/A  COUNTS:  YES  TOURNIQUET:  * No tourniquets in log *  DICTATION: .Note written in EPIC  PLAN OF CARE: Admit to inpatient   PATIENT DISPOSITION:  PACU - hemodynamically stable.   Delay start of Pharmacological VTE agent (>24hrs) due to surgical blood loss or risk of bleeding: not applicable

## 2018-05-10 NOTE — Consult Note (Signed)
ORTHOPAEDIC CONSULTATION  REQUESTING PHYSICIAN: Karmen Bongo, MD  PCP:  Shirline Frees, MD  Chief Complaint: Fall  HPI: Jenna Chambers is a 85 y.o. female who complains of right hip and leg pain following a ground-level fall prior to presentation to the hospital.  She states she was in her normal state of health where she ambulates without any assistive devices and she was trying to stop on a bug and fell.  He landed on her right side.  She denies any numbness or tingling.  She does have some mild baseline neuropathy.  She endorses history of hypertension and glaucoma as well as type 2 diabetes.  Remote history of colon cancer.   She denies any previous right hip surgeries or pain.  She does have a history of left total knee arthroplasty in 2010 by my partner Dr. Wynelle Link.  Past Medical History:  Diagnosis Date  . Colon cancer ([place])    resecetion  . Diabetes ([place])    Type 2  . Glaucoma    --both eyes per patient getting worse  . Gout   . Hypertension   . Osteopenia   . Sleep apnea    no CPAP  . Ulcerative colitis [facility])    Past Surgical History:  Procedure Laterality Date  . BILATERAL SALPINGOOPHORECTOMY  1993   serous cystadenoma  . CATARACT EXTRACTION  2006, 2008   gluacoma  . EXPLORATORY LAPAROTOMY  1996   incarcerated hernia  . PARTIAL COLECTOMY  1991  . TOTAL KNEE ARTHROPLASTY Left 2010   Social History   Socioeconomic History  . Marital status: Widowed    Spouse name: Not on file  . Number of children: Not on file  . Years of education: Not on file  . Highest education level: Not on file  Occupational History  . Occupation: retired  Scientific laboratory technician  . Financial resource strain: Not on file  . Food insecurity:    Worry: Not on file    Inability: Not on file  . Transportation needs:    Medical: Not on file    Non-medical: Not on file  Tobacco Use  . Smoking status: Never Smoker  . Smokeless tobacco: Never Used  Substance and Sexual Activity  .  Alcohol use: No    Alcohol/week: 0.0 standard drinks  . Drug use: No  . Sexual activity: Never    Partners: Male    Birth control/protection: Post-menopausal  Lifestyle  . Physical activity:    Days per week: Not on file    Minutes per session: Not on file  . Stress: Not on file  Relationships  . Social connections:    Talks on phone: Not on file    Gets together: Not on file    Attends religious service: Not on file    Active member of club or organization: Not on file    Attends meetings of clubs or organizations: Not on file    Relationship status: Not on file  Other Topics Concern  . Not on file  Social History Narrative  . Not on file   Family History  Problem Relation Age of Onset  . Heart disease Father        dec age 74  . Hypertension Father   . Osteoarthritis Mother   . Osteoporosis Mother   . Hypertension Sister   . Hypertension Brother   . Cancer Brother 90       colon ca   Allergies  Allergen Reactions  . Latex  Rash  . Penicillins Rash   Prior to Admission medications   Medication Sig Start Date End Date Taking? Authorizing Provider  acetaminophen (TYLENOL) 500 MG tablet Take 1,000 mg by mouth every 6 (six) hours as needed.   Yes [provider]  amLODipine (NORVASC) 10 MG tablet Take 10 mg by mouth daily.   Yes [provider]  aspirin 81 MG tablet Take 81 mg by mouth daily.   Yes [provider]  brimonidine (ALPHAGAN P) 0.1 % SOLN Place 1 drop into both eyes 2 (two) times daily. 06/01/16  Yes [provider]  brinzolamide (AZOPT) 1 % ophthalmic suspension Place 1 drop into both eyes 2 (two) times a day. 05/28/16  Yes [provider]  Cholecalciferol (VITAMIN D) 2000 UNITS CAPS Take by mouth.   Yes [provider]  diclofenac sodium (VOLTAREN) 1 % GEL Apply topically as needed.   Yes [provider]  folic acid (FOLVITE) 1 MG tablet Take 1 mg by mouth daily.   Yes [provider]   levothyroxine (SYNTHROID, LEVOTHROID) 150 MCG tablet Take 150 mcg by mouth daily before breakfast.   Yes [provider]  losartan (COZAAR) 100 MG tablet Take 100 mg by mouth daily.  02/25/13  Yes [provider]  meloxicam (MOBIC) 15 MG tablet Take 15 mg by mouth as needed for pain.   Yes [provider]  sulfaSALAzine (AZULFIDINE) 500 MG tablet Take 1,000 mg by mouth 2 (two) times daily.   Yes [provider]  Tafluprost, PF, (ZIOPTAN) 0.0015 % SOLN Place 1 drop into both eyes at bedtime. 05/29/16  Yes [provider]  ZIOPTAN 0.0015 % SOLN Place 1 drop into both eyes daily. 04/13/14  Yes [provider]   Dg Pelvis Portable  Result Date: 05/10/2018 CLINICAL DATA:  Fall today.  Hip pain. EXAM: PORTABLE PELVIS 1-2 VIEWS COMPARISON:  Pelvic CT 09/28/2017. FINDINGS: The bones appear adequately mineralized. There is an acute intertrochanteric right femur fracture which is mildly comminuted and moderately angulated. The femoral head is located. No evidence pelvic fracture. Mild lower lumbar spondylosis noted. IMPRESSION: Comminuted and angulated intertrochanteric right femur fracture. Electronically Signed   By: Richardean Sale M.D.   On: 05/10/2018 13:56   Dg Chest Portable 1 View  Result Date: 05/10/2018 CLINICAL DATA:  Fall today.  Right hip fracture. EXAM: PORTABLE CHEST 1 VIEW COMPARISON:  Radiographs 09/16/2008. FINDINGS: 1302 hours. Mild right hemidiaphragm elevation with resulting minimal right basilar atelectasis. The lungs are otherwise clear. There is no pleural effusion or pneumothorax. The heart size and mediastinal contours are stable. No acute osseous findings are seen. IMPRESSION: No evidence of acute chest injury or active cardiopulmonary process. Minimal right basilar atelectasis related to right hemidiaphragm elevation. Electronically Signed   By: Richardean Sale M.D.   On: 05/10/2018 13:59   Dg Femur [place] 2 Views Right  Result  Date: 05/10/2018 CLINICAL DATA:  Fall today.  Right hip pain. EXAM: RIGHT FEMUR PORTABLE 2 VIEW COMPARISON:  None. FINDINGS: The bones appear adequately mineralized. There is an acute comminuted intertrochanteric right femur fracture which demonstrates moderate apex lateral angulation. The right femoral head is located. There are mild underlying right hip degenerative changes. There are moderate degenerative changes at the right knee. No evidence of acute injury in the distal femur. IMPRESSION: Comminuted and angulated intertrochanteric right femur fracture. Electronically Signed   By: Richardean Sale M.D.   On: 05/10/2018 13:58    Positive ROS: All  other systems have been reviewed and were otherwise negative with the exception of those mentioned in the HPI and as above.  Physical Exam: General: Alert, no acute distress Cardiovascular: No pedal edema Respiratory: No cyanosis, no use of accessory musculature GI: No organomegaly, abdomen is soft and non-tender Skin: No lesions in the area of chief complaint Neurologic: Sensation intact distally Psychiatric: Patient is competent for consent with normal mood and affect Lymphatic: No axillary or cervical lymphadenopathy  MUSCULOSKELETAL:  Right lower extremity:  It is held externally rotated and shortened.  She has no open wounds.  She has varicose veins noted around the foot and ankle.  He is neurovascular intact throughout.  She has expected pain at the hip but also some mild pain along the inferior pole of the patella and the lateral joint line.  Mild swelling at the knee as well.  Assessment: 1.  Right hip intertrochanteric fracture.  Plan: -We are to plan for intramedullary nailing of the right hip.  This is an urgent issue will therefore need urgent management.  I discussed my recommendation for IM nail of the right hip with the patient.  I solicited all questions and answered those to her satisfaction.  - The risks, benefits, and  alternatives were discussed with the patient. There are risks associated with the surgery including, but not limited to, problems with anesthesia (death), infection, differences in leg length/angulation/rotation, fracture of bones, loosening or failure of implants, malunion, nonunion, hematoma (blood accumulation) which may require surgical drainage, blood clots, pulmonary embolism, nerve injury (foot drop), and blood vessel injury. The patient understands these risks and elects to proceed.   -She does have a little bit of tenderness on her knee exam and therefore like to get some knee x-rays postoperatively.  On the femur images there is perhaps a slight defect in the lateral condyle is concerning on the tibia.  This is certainly nothing that would prevent [place] from doing the current surgery but we will need to follow-up on that.    [name], MD Cell [phone]    05/10/2018 3:44 PM

## 2018-05-10 NOTE — H&P (Signed)
History and Physical    Jenna Chambers OVZ:[id number] DOB: [birth year] DOA: 05/10/2018  PCP: Shirline Frees, MD Consultants:  Charlett Nose - eye Patient coming from:  Home - lives alone; NOK: Niece, Jenna Chambers, [phone]  Chief Complaint: hip pain  HPI: Jenna Chambers is a 85 y.o. female with medical history significant of UC; OSA not on CPAP; HTN; DM; and remote colon CA s/p resection presenting with hip pain s/p mechanical fall.  She saw a bug and tried to step on it and lost her balance and fell backwards.  She hurt her right hip and knew there was problem as soon as she fell.  She was unable get up.     ED Course:  Intertrochanteric fracture - repair today vs. tomorrow  Review of Systems: As per HPI; otherwise review of systems reviewed and negative.   Ambulatory Status:  Ambulates without assistance or with a cane  Past Medical History:  Diagnosis Date  . Colon cancer ([place])    resecetion  . Diabetes ([place])    Type 2  . Glaucoma    --both eyes per patient getting worse  . Gout   . Hypertension   . Osteopenia   . Sleep apnea    no CPAP  . Ulcerative colitis [facility])     Past Surgical History:  Procedure Laterality Date  . BILATERAL SALPINGOOPHORECTOMY  1993   serous cystadenoma  . CATARACT EXTRACTION  2006, 2008   gluacoma  . EXPLORATORY LAPAROTOMY  1996   incarcerated hernia  . PARTIAL COLECTOMY  1991  . TOTAL KNEE ARTHROPLASTY Left 2010    Social History   Socioeconomic History  . Marital status: Widowed    Spouse name: Not on file  . Number of children: Not on file  . Years of education: Not on file  . Highest education level: Not on file  Occupational History  . Occupation: retired  Scientific laboratory technician  . Financial resource strain: Not on file  . Food insecurity:    Worry: Not on file    Inability: Not on file  . Transportation needs:    Medical: Not on file    Non-medical: Not on file  Tobacco Use  . Smoking status: Never Smoker  . Smokeless tobacco:  Never Used  Substance and Sexual Activity  . Alcohol use: No    Alcohol/week: 0.0 standard drinks  . Drug use: No  . Sexual activity: Never    Partners: Male    Birth control/protection: Post-menopausal  Lifestyle  . Physical activity:    Days per week: Not on file    Minutes per session: Not on file  . Stress: Not on file  Relationships  . Social connections:    Talks on phone: Not on file    Gets together: Not on file    Attends religious service: Not on file    Active member of club or organization: Not on file    Attends meetings of clubs or organizations: Not on file    Relationship status: Not on file  . Intimate partner violence:    Fear of current or ex partner: Not on file    Emotionally abused: Not on file    Physically abused: Not on file    Forced sexual activity: Not on file  Other Topics Concern  . Not on file  Social History Narrative  . Not on file    Allergies  Allergen Reactions  . Latex Rash  . Penicillins Rash  Family History  Problem Relation Age of Onset  . Heart disease Father        dec age 94  . Hypertension Father   . Osteoarthritis Mother   . Osteoporosis Mother   . Hypertension Sister   . Hypertension Brother   . Cancer Brother 48       colon ca    Prior to Admission medications   Medication Sig Start Date End Date Taking? Authorizing Provider  acetaminophen (TYLENOL) 500 MG tablet Take 1,000 mg by mouth every 6 (six) hours as needed.   Yes [provider]  amLODipine (NORVASC) 10 MG tablet Take 10 mg by mouth daily.   Yes [provider]  aspirin 81 MG tablet Take 81 mg by mouth daily.   Yes [provider]  brimonidine (ALPHAGAN P) 0.1 % SOLN Place 1 drop into both eyes 2 (two) times daily. 06/01/16  Yes [provider]  brinzolamide (AZOPT) 1 % ophthalmic suspension Place 1 drop into both eyes 2 (two) times a day. 05/28/16  Yes [provider]  Cholecalciferol (VITAMIN D) 2000 UNITS  CAPS Take by mouth.   Yes [provider]  diclofenac sodium (VOLTAREN) 1 % GEL Apply topically as needed.   Yes [provider]  folic acid (FOLVITE) 1 MG tablet Take 1 mg by mouth daily.   Yes [provider]  levothyroxine (SYNTHROID, LEVOTHROID) 150 MCG tablet Take 150 mcg by mouth daily before breakfast.   Yes [provider]  losartan (COZAAR) 100 MG tablet Take 100 mg by mouth daily.  02/25/13  Yes [provider]  meloxicam (MOBIC) 15 MG tablet Take 15 mg by mouth as needed for pain.   Yes [provider]  sulfaSALAzine (AZULFIDINE) 500 MG tablet Take 1,000 mg by mouth 2 (two) times daily.   Yes [provider]  Tafluprost, PF, (ZIOPTAN) 0.0015 % SOLN Place 1 drop into both eyes at bedtime. 05/29/16  Yes [provider]  ZIOPTAN 0.0015 % SOLN Place 1 drop into both eyes daily. 04/13/14  Yes [provider]    Physical Exam: Vitals:   05/10/18 1345 05/10/18 1400 05/10/18 1415 05/10/18 1430  BP: (!) 117/98 130/60 (!) 151/64 (!) 131/54  Pulse: 65 69 67 73  Resp:   18 18  Temp:      TempSrc:      SpO2: 99% 100% 98% 99%  Weight:      Height:         . General:  Appears calm and comfortable and is NAD . Eyes:  PERRL, EOMI, normal lids, iris . ENT:  grossly normal hearing, lips & tongue, mmm . Neck:  no LAD, masses or thyromegaly . Cardiovascular:  RRR, no m/r/g. No LE edema.  Marland Kitchen Respiratory:   CTA bilaterally with no wheezes/rales/rhonchi.  Normal respiratory effort. . Abdomen:  soft, NT, ND, NABS . Skin:  no rash or induration seen on limited exam . Musculoskeletal: RLE shortening and external rotation . Lower extremity:  No LE edema.  Limited foot exam with no ulcerations.  2+ distal pulses.  +B LE varicosities. Marland Kitchen Psychiatric:  grossly normal mood and affect, speech fluent and appropriate, AOx3 . Neurologic:  CN 2-12 grossly intact, moves all extremities in coordinated fashion, sensation intact     Radiological Exams on Admission: Dg Pelvis Portable  Result Date: 05/10/2018 CLINICAL DATA:  Fall today.  Hip pain. EXAM: PORTABLE PELVIS 1-2 VIEWS COMPARISON:  Pelvic CT 09/28/2017. FINDINGS: The bones appear  adequately mineralized. There is an acute intertrochanteric right femur fracture which is mildly comminuted and moderately angulated. The femoral head is located. No evidence pelvic fracture. Mild lower lumbar spondylosis noted. IMPRESSION: Comminuted and angulated intertrochanteric right femur fracture. Electronically Signed   By: Richardean Sale M.D.   On: 05/10/2018 13:56   Dg Chest Portable 1 View  Result Date: 05/10/2018 CLINICAL DATA:  Fall today.  Right hip fracture. EXAM: PORTABLE CHEST 1 VIEW COMPARISON:  Radiographs 09/16/2008. FINDINGS: 1302 hours. Mild right hemidiaphragm elevation with resulting minimal right basilar atelectasis. The lungs are otherwise clear. There is no pleural effusion or pneumothorax. The heart size and mediastinal contours are stable. No acute osseous findings are seen. IMPRESSION: No evidence of acute chest injury or active cardiopulmonary process. Minimal right basilar atelectasis related to right hemidiaphragm elevation. Electronically Signed   By: Richardean Sale M.D.   On: 05/10/2018 13:59   Dg Femur [place], [place] 2 Views Right  Result Date: 05/10/2018 CLINICAL DATA:  Fall today.  Right hip pain. EXAM: RIGHT FEMUR PORTABLE 2 VIEW COMPARISON:  None. FINDINGS: The bones appear adequately mineralized. There is an acute comminuted intertrochanteric right femur fracture which demonstrates moderate apex lateral angulation. The right femoral head is located. There are mild underlying right hip degenerative changes. There are moderate degenerative changes at the right knee. No evidence of acute injury in the distal femur. IMPRESSION: Comminuted and angulated intertrochanteric right femur fracture. Electronically Signed   By: Richardean Sale M.D.   On: 05/10/2018 13:58     EKG: Independently reviewed.  NSR with rate 72; low voltage, nonspecific ST changes with no evidence of acute ischemia   Labs on Admission: I have personally reviewed the available labs and imaging studies at the time of the admission.  Pertinent labs:   Glucose 168 GFR 55 Normal CBC   Assessment/Plan Principal Problem:   Hip fracture (HCC) Active Problems:   Diabetes ([place])   Essential hypertension, benign   Glaucoma   Ulcerative colitis ([place])   Hip fracture -Mechanical fall resulting in hip fracture -Orthopedics consulted, for repair today -NPO in anticipation of surgical repair -SCDs overnight, start Lovenox post-operatively (or as per ortho) -Pain control with Robxain, Vicodin, and Morphine prn -SW consult for rehab placement -Will need PT consult post-operatively -Hip fracture order set utilized  DM -Will check A1c -She does not appear to be taking any medication for this issue at this time -Will not cover with SSI at this time  HTN -Continue home medications including Norvasc, Cozaar  Glaucoma -She is on a multitude of outpatient drops, will continue  UC -Continue Sulfasalazine  DVT prophylaxis:  SCDs until approved for Lovenox by orthopedics Code Status:  FULL - confirmed with patient for this particular visit only due to the nature of the presenting complaint Family Communication: None present  Disposition Plan:  Home once clinically improved Consults called: Orthopedics; SW, Nutrition; will need PT post-operatively  Admission status: Admit - It is my clinical opinion that admission to INPATIENT is reasonable and necessary because of the expectation that this patient will require hospital care that crosses at least 2 midnights to treat this condition based on the medical complexity of the problems presented.  Given the aforementioned information, the predictability of an adverse outcome is felt to be significant.    Karmen Bongo MD [facility]    How to contact the [facility] Attending or Consulting provider [place] or covering provider during after hours [place], for this  patient?  1. Check the care team in [facility] and look for a) attending/consulting [facility] provider listed and b) the [facility] team listed 2. Log into [email or web address] and use [facility]'s universal password to access. If you do not have the password, please contact the hospital operator. 3. Locate the [facility] provider you are looking for under [facility] and page to a number that you can be directly reached. 4. If you still have difficulty reaching the provider, please page the [facility] (Director on Call) for the Hospitalists listed on amion for assistance.   05/10/2018, 3:09 PM

## 2018-05-10 NOTE — Progress Notes (Signed)
Dedicated knee xrays reviewed.  No acute fractures or findings.  She has likely aggrevated her pre-existing OA.  Ok to be full WBAT on RLE now s/p IMN.

## 2018-05-10 NOTE — Op Note (Signed)
Date of Surgery: 05/10/2018  INDICATIONS: [name] is a 85 y.o.-year-old female who sustained a right hip fracture. The risks and benefits of the procedure discussed with the patient prior to the procedure and all questions were answered; consent was obtained.  PREOPERATIVE DIAGNOSIS: right hip fracture   POSTOPERATIVE DIAGNOSIS: Same   PROCEDURE: Treatment of intertrochanteric, pertrochanteric, subtrochanteric fracture with intramedullary implant. CPT [phone]   SURGEON: Dannielle Karvonen. Stann Mainland, M.D.   ANESTHESIA: general   IV FLUIDS AND URINE: See anesthesia record   ESTIMATED BLOOD LOSS: 100 cc  IMPLANTS:   Synthes 10 mm x 235 mm 105 mm lag screw 38 x 5.0 mm distal interlock  DRAINS: None.   COMPLICATIONS: None.   DESCRIPTION OF PROCEDURE: The patient was brought to the operating room and placed supine on the operating table. The patient's leg had been signed prior to the procedure. The patient had the anesthesia placed by the anesthesiologist. The prep verification and incision time-outs were performed to confirm that this was the correct patient, site, side and location. The patient had an SCD on the opposite lower extremity. The patient did receive antibiotics prior to the incision and was re-dosed during the procedure as needed at indicated intervals. The patient was positioned on the fracture table with the table in traction and internal rotation to reduce the hip. The well leg was placed in a scissor position and all bony prominences were well-padded. The patient had the lower extremity prepped and draped in the standard surgical fashion. The incision was made 4 finger breadths superior to the greater trochanter. A guide pin was inserted into the tip of the greater trochanter under fluoroscopic guidance. An opening reamer was used to gain access to the femoral canal. The nail length was measured and inserted down the femoral canal to its proper depth. The appropriate version of  insertion for the lag screw was found under fluoroscopy. A pin was inserted up the femoral neck through the jig. The length of the lag screw was then measured. The lag screw was inserted as near to center-center in the head as possible. The leg was taken out of traction, then the compression screw was used to compress across the fracture. Compression was visualized on serial xrays.   We next turned our attention to the distal interlocking screw.  This was placed through the drill guide of the nail inserter.  A small incision was made overlying the lateral thigh at the screw site, and a tonsil was used to disect down to bone.  A drill pass was made through the jig and across the nail through both cortices.  This was measured, and the appropriate screw was placed under hand power and found to have good bite.    The wound was copiously irrigated with saline and the subcutaneous layer closed with 2.0 vicryl and the skin was reapproximated with staples. The wounds were cleaned and dried a final time and a sterile dressing was placed. The hip was taken through a range of motion at the end of the case under fluoroscopic imaging to visualize the approach-withdraw phenomenon and confirm implant length in the head. The patient was then awakened from anesthesia and taken to the recovery room in stable condition. All counts were correct at the end of the case.   POSTOPERATIVE PLAN: The patient will be weight bearing as tolerated and will return in 2 weeks for staple removal and the patient will receive DVT prophylaxis based on other medications, activity level,  and risk ratio of bleeding to thrombosis. She will simply resume her daily asa.    Geralynn Rile, MD [facility] [place] [phone] 5:41 PM

## 2018-05-10 NOTE — Anesthesia Procedure Notes (Signed)
Procedure Name: Intubation Date/Time: 05/10/2018 4:27 PM Performed by: Jearld Pies, CRNA Pre-anesthesia Checklist: Patient identified, Emergency Drugs available, Suction available and Patient being monitored Patient Re-evaluated:Patient Re-evaluated prior to induction Oxygen Delivery Method: Circle System Utilized Preoxygenation: Pre-oxygenation with 100% oxygen Induction Type: IV induction and Rapid sequence Laryngoscope Size: Mac and 3 Grade View: Grade II Tube type: Oral Tube size: 7.0 mm Number of attempts: 1 Airway Equipment and Method: Stylet and Oral airway Placement Confirmation: ETT inserted through vocal cords under direct vision,  positive ETCO2 and breath sounds checked- equal and bilateral Secured at: 21 cm Tube secured with: Tape Dental Injury: Teeth and Oropharynx as per pre-operative assessment

## 2018-05-10 NOTE — ED Triage Notes (Signed)
Pt BIB [facility] for a mechanical fall. EMS reports pt was trying to kill a bug, fell backwards and caused right hip pain. EMS reports shortening and rotation of the right leg. EMS reports vital signs were elevated, 169/108 with a hx of htn, and IV as noted. EMS advised 148mcg of fentanyl. EMS advised no other changes in route.   Pt states she was trying to kill a bug by stomping on it and fell. Pt denies any dizziness, SOB, CP, or covid type symptoms.

## 2018-05-10 NOTE — ED Provider Notes (Signed)
[facility] Emergency Department Provider Note MRN:  [id number]  Arrival date & time: 05/10/18     Chief Complaint   Fall and Hip Pain (Shortened and rotated)   History of Present Illness   Jenna Chambers is a 85 y.o. year-old female with a history of colon cancer, diabetes presenting to the ED with chief complaint of hip pain.  Patient explains that shortly prior to arrival she saw a bug on the floor.  Attempted to step on it, slipped, fell backwards onto her right hip.  Denies head trauma, no loss of consciousness, denies anticoagulant use other than 81 mg daily aspirin.  Denies neck pain, no back pain, no chest pain, no shortness of breath, no abdominal pain.  Isolated right hip pain, worse with motion.  Currently 2 out of 10 in severity after fentanyl given by EMS.  Pain is constant.  Review of Systems  A complete 10 system review of systems was obtained and all systems are negative except as noted in the HPI and PMH.   Patient's Health History    Past Medical History:  Diagnosis Date  . Colon cancer ([place])    resecetion  . Diabetes ([place])    Type 2  . Glaucoma    --both eyes per patient getting worse  . Gout   . Hypertension   . Osteopenia   . Sleep apnea    no CPAP  . Ulcerative colitis [facility])     Past Surgical History:  Procedure Laterality Date  . BILATERAL SALPINGOOPHORECTOMY  1993   serous cystadenoma  . CATARACT EXTRACTION  2006, 2008   gluacoma  . EXPLORATORY LAPAROTOMY  1996   incarcerated hernia  . PARTIAL COLECTOMY  1991  . TOTAL KNEE ARTHROPLASTY Left 2010    Family History  Problem Relation Age of Onset  . Heart disease Father        dec age 21  . Hypertension Father   . Osteoarthritis Mother   . Osteoporosis Mother   . Hypertension Sister   . Hypertension Brother   . Cancer Brother 59       colon ca    Social History   Socioeconomic History  . Marital status: Widowed    Spouse name: Not on file  . Number of children: Not on  file  . Years of education: Not on file  . Highest education level: Not on file  Occupational History  . Occupation: retired  Scientific laboratory technician  . Financial resource strain: Not on file  . Food insecurity:    Worry: Not on file    Inability: Not on file  . Transportation needs:    Medical: Not on file    Non-medical: Not on file  Tobacco Use  . Smoking status: Never Smoker  . Smokeless tobacco: Never Used  Substance and Sexual Activity  . Alcohol use: No    Alcohol/week: 0.0 standard drinks  . Drug use: No  . Sexual activity: Never    Partners: Male    Birth control/protection: Post-menopausal  Lifestyle  . Physical activity:    Days per week: Not on file    Minutes per session: Not on file  . Stress: Not on file  Relationships  . Social connections:    Talks on phone: Not on file    Gets together: Not on file    Attends religious service: Not on file    Active member of club or organization: Not on file    Attends  meetings of clubs or organizations: Not on file    Relationship status: Not on file  . Intimate partner violence:    Fear of current or ex partner: Not on file    Emotionally abused: Not on file    Physically abused: Not on file    Forced sexual activity: Not on file  Other Topics Concern  . Not on file  Social History Narrative  . Not on file     Physical Exam  Vital Signs and Nursing Notes reviewed Vitals:   05/10/18 1415 05/10/18 1430  BP: (!) 151/64 (!) 131/54  Pulse: 67 73  Resp: 18 18  Temp:    SpO2: 98% 99%    CONSTITUTIONAL: Well-appearing, NAD NEURO:  Alert and oriented x 3, no focal deficits EYES:  eyes equal and reactive ENT/NECK:  no LAD, no JVD CARDIO: Regular rate, well-perfused, normal S1 and S2 PULM:  CTAB no wheezing or rhonchi GI/GU:  normal bowel sounds, non-distended, non-tender MSK/SPINE:  No gross deformities, no edema; tender to palpation to the right hip, right hip is externally rotated and shortened, equal strong DP pulses  bilaterally, neurovascularly intact SKIN:  no rash, atraumatic PSYCH:  Appropriate speech and behavior  Diagnostic and Interventional Summary    Labs Reviewed  COMPREHENSIVE METABOLIC PANEL - Abnormal; Notable for the following components:      Result Value   Glucose, Bld 168 (*)    GFR calc non Af Amer 55 (*)    All other components within normal limits  NOVEL CORONAVIRUS, NAA (HOSPITAL ORDER, SEND-OUT TO REF LAB)  CBC WITH DIFFERENTIAL/PLATELET    DG Pelvis Portable  Final Result    DG FEMUR PORT, MIN 2 VIEWS RIGHT  Final Result    DG Chest Portable 1 View  Final Result      Medications  aspirin EC tablet 81 mg ( Oral Automatically Held 05/19/18 1000)  amLODipine (NORVASC) tablet 10 mg ( Oral Automatically Held 05/19/18 1000)  losartan (COZAAR) tablet 100 mg ( Oral Automatically Held 05/19/18 1000)  levothyroxine (SYNTHROID) tablet 150 mcg ( Oral Automatically Held 05/19/18 0600)  sulfaSALAzine (AZULFIDINE) tablet 1,000 mg ( Oral Automatically Held 03/24/74 7341)  folic acid (FOLVITE) tablet 1 mg ( Oral Automatically Held 05/19/18 1000)  brimonidine (ALPHAGAN) 0.15 % ophthalmic solution 1 drop ( Both Eyes Automatically Held 05/18/18 2200)  brinzolamide (AZOPT) 1 % ophthalmic suspension 1 drop ( Both Eyes Automatically Held 05/18/18 2100)  Tafluprost (PF) 0.0015 % SOLN 1 drop ( Both Eyes Automatically Held 05/18/18 2200)  HYDROcodone-acetaminophen (NORCO/VICODIN) 5-325 MG per tablet 1-2 tablet ( Oral MAR Hold 05/10/18 1621)  morphine 2 MG/ML injection 0.5 mg ( Intravenous MAR Hold 05/10/18 1621)  methocarbamol (ROBAXIN) tablet 500 mg ( Oral MAR Hold 05/10/18 1621)    Or  methocarbamol (ROBAXIN) 500 mg in dextrose 5 % 50 mL IVPB ( Intravenous MAR Hold 05/10/18 1621)  docusate sodium (COLACE) capsule 100 mg ( Oral Automatically Held 05/18/18 2200)  polyethylene glycol (MIRALAX / GLYCOLAX) packet 17 g ( Oral MAR Hold 05/10/18 1621)  bisacodyl (DULCOLAX) EC tablet 5 mg ( Oral MAR Hold 05/10/18 1621)   ceFAZolin (ANCEF) 2-4 GM/100ML-% IVPB (has no administration in time range)  HYDROmorphone (DILAUDID) injection 0.5 mg (0.5 mg Intravenous Given 05/10/18 1419)     Procedures Critical Care Critical Care Documentation Critical care time provided by me (excluding procedures): 32 minutes  Condition necessitating critical care: Acute intertrochanteric femur fracture requiring emergent surgery  Components of critical care management:  reviewing of prior records, laboratory and imaging interpretation, frequent re-examination and reassessment of vital signs, administration of IV opioids, discussion with consulting services    ED Course and Medical Decision Making  I have reviewed the triage vital signs and the nursing notes.  Pertinent labs & imaging results that were available during my care of the patient were reviewed by me and considered in my medical decision making (see below for details).  Concern for acute femoral neck fracture in this 85 year old female with mechanical ground-level fall.  X-rays pending, will provide Dilaudid for pain control.  Ortho will likely repair today, admit to hospital service for further care.  Barth Kirks. Sedonia Small, MD [place] [email or web address]  Final Clinical Impressions(s) / ED Diagnoses     ICD-10-CM   1. Closed nondisplaced intertrochanteric fracture of right femur, initial encounter ([place]) S72.144A   2. Fall W19.XXXA DG FEMUR PORT, MIN 2 VIEWS RIGHT    DG FEMUR PORT, MIN 2 VIEWS RIGHT    ED Discharge Orders    None         Maudie Flakes, MD 05/10/18 [phone]

## 2018-05-10 NOTE — Anesthesia Preprocedure Evaluation (Addendum)
Anesthesia Evaluation  Patient identified by MRN, date of birth, ID band Patient awake    Reviewed: Allergy & Precautions, NPO status , Patient's Chart, lab work & pertinent test results  Airway Mallampati: I  TM Distance: >3 FB Neck ROM: Full    Dental no notable dental hx.    Pulmonary sleep apnea ,    Pulmonary exam normal breath sounds clear to auscultation       Cardiovascular hypertension, Pt. on medications Normal cardiovascular exam Rhythm:Regular Rate:Normal  ECG: SR, rate 72   Neuro/Psych negative neurological ROS  negative psych ROS   GI/Hepatic Neg liver ROS, PUD, Colon cancer s/p resection  Ulcerative colitis   Endo/Other  diabetesHypothyroidism Morbid obesity  Renal/GU negative Renal ROS     Musculoskeletal negative musculoskeletal ROS (+)   Abdominal (+) + obese,   Peds  Hematology negative hematology ROS (+)   Anesthesia Other Findings Hip fracture right  Reproductive/Obstetrics                            Anesthesia Physical Anesthesia Plan  ASA: III and emergent  Anesthesia Plan: General   Post-op Pain Management:    Induction: Intravenous and Rapid sequence  PONV Risk Score and Plan: 3 and Ondansetron, Dexamethasone and Treatment may vary due to age or medical condition  Airway Management Planned: Oral ETT  Additional Equipment:   Intra-op Plan:   Post-operative Plan: Extubation in OR  Informed Consent: I have reviewed the patients History and Physical, chart, labs and discussed the procedure including the risks, benefits and alternatives for the proposed anesthesia with the patient or authorized representative who has indicated his/her understanding and acceptance.     Dental advisory given  Plan Discussed with: CRNA  Anesthesia Plan Comments:         Anesthesia Quick Evaluation

## 2018-05-11 LAB — GLUCOSE, CAPILLARY
Glucose-Capillary: 145 mg/dL — ABNORMAL HIGH (ref 70–99)
Glucose-Capillary: 191 mg/dL — ABNORMAL HIGH (ref 70–99)
Glucose-Capillary: 117 mg/dL — ABNORMAL HIGH (ref 70–99)

## 2018-05-11 LAB — HEMOGLOBIN A1C
Hgb A1c MFr Bld: 6.3 % — ABNORMAL HIGH (ref 4.8–5.6)
Mean Plasma Glucose: 134.11 mg/dL

## 2018-05-11 LAB — NOVEL CORONAVIRUS, NAA (HOSP ORDER, SEND-OUT TO REF LAB; TAT 18-24 HRS): SARS-CoV-2, NAA: NOT DETECTED

## 2018-05-11 NOTE — Progress Notes (Signed)
Rehab Admissions Coordinator Note:  Per OT and PT recommendation, this patient was screened by Jhonnie Garner for appropriateness for an Inpatient Acute Rehab Consult.  At this time, we are recommending an Inpatient Rehab consult. AC will contact MD to request an IP Rehab Consult Order.   Jhonnie Garner 05/11/2018, 12:33 PM  I can be reached at [phone].

## 2018-05-11 NOTE — TOC Initial Note (Signed)
Transition of Care [facility]) - Initial/Assessment Note    Patient Details  Name: Jenna Chambers MRN: [id number] Date of Birth: 14-Feb-1931  [facility]) CM/SW Contact:    Eileen Stanford, LCSW Phone Number: 05/11/2018, 10:53 AM  Clinical Narrative:       Pt is alert and oriented. Pt has a pending COVID-19 test. CSW spoke with pt via telephone. Pt is understanding regarding SNF as she had been in one several years ago after surgery. Pt would rather go to CIR however understanding SNF would be back up. Pt is requesting a SNF list. CSW to send list to patients room to review. CSW continuing to follow.            Expected Discharge Plan: Skilled Nursing Facility Barriers to Discharge: Continued Medical Work up   Patient Goals and CMS Choice Patient states their goals for this hospitalization and ongoing recovery are:: "to get better and get back home, as soon as possible" CMS Medicare.gov Compare Post Acute Care list provided to:: Patient Choice offered to / list presented to : Patient  Expected Discharge Plan and Services Expected Discharge Plan: [place] In-house Referral: Clinical Social Work     Living arrangements for the past 2 months: [place]                                      Prior Living Arrangements/Services Living arrangements for the past 2 months: Single Family Home Lives with:: Self Patient language and need for interpreter reviewed:: Yes Do you feel safe going back to the place where you live?: No      Need for Family Participation in Patient Care: No (Comment) Care giver support system in place?: Yes (comment)   Criminal Activity/Legal Involvement Pertinent to Current Situation/Hospitalization: No - Comment as needed  Activities of Daily Living Home Assistive Devices/Equipment: None ADL Screening (condition at time of admission) Patient's cognitive ability adequate to safely complete daily activities?: Yes Is the  patient deaf or have difficulty hearing?: No Does the patient have difficulty seeing, even when wearing glasses/contacts?: No Does the patient have difficulty concentrating, remembering, or making decisions?: No Patient able to express need for assistance with ADLs?: No Does the patient have difficulty dressing or bathing?: No Independently performs ADLs?: Yes (appropriate for developmental age) Does the patient have difficulty walking or climbing stairs?: No Weakness of Legs: None Weakness of Arms/Hands: None  Permission Sought/Granted Permission sought to share information with : Family Supports, Customer service manager    Share Information with NAME: [name]     Permission granted to share info w Relationship: Neice     Emotional Assessment Appearance:: Appears stated age Attitude/Demeanor/Rapport: (pt was appropriate) Affect (typically observed): Accepting, Appropriate, Calm Orientation: : Oriented to Self, Oriented to Place, Oriented to  Time, Oriented to Situation Alcohol / Substance Use: Not Applicable Psych Involvement: No (comment)  Admission diagnosis:  Fall [W19.XXXA] Patient Active Problem List   Diagnosis Date Noted  . Hip fracture ([place]) 05/10/2018  . Colon cancer ([place]) 02/27/2013  . Essential hypertension, benign 02/27/2013  . Glaucoma 02/27/2013  . Ulcerative colitis ([place]) 02/27/2013  . Diabetes [facility]    PCP:  Shirline Frees, MD Pharmacy:   [id number] - [place], [address] [place] [address] Phone: [phone] Fax: [place], [place] -  [address] [place] [address] Phone: [phone] Fax: [phone]     Social Determinants of Health (SDOH) Interventions    Readmission Risk Interventions No flowsheet data found.

## 2018-05-11 NOTE — Progress Notes (Signed)
PROGRESS NOTE    Jenna Chambers  LSL:[id number] DOB: [birth year] DOA: 05/10/2018 PCP: Shirline Frees, MD    Brief Narrative:   Jenna Chambers is a 85 y.o. female with medical history significant of UC; OSA not on CPAP; HTN; DM; and remote colon CA s/p resection presenting with hip pain s/p mechanical fall. She was found to have intertrochanteric fracture on the right. Orthopedics consulted and she underwent  Intramedullary nail placement on 4/25.   Assessment & Plan:   Principal Problem:   Hip fracture ([place]) Active Problems:   Diabetes ([place])   Essential hypertension, benign   Glaucoma   Ulcerative colitis ([place])    Right hip fracture:  Mechanical fall s/p Intramedullary nail placement on 4/25.  Pain control and physical therapy recommending CIR.  Weight bearing on RLE as recommended by orthopedics.    Diabetes mellitus:  CBG (last 3)  Recent Labs    05/10/18 2021 05/11/18 0820 05/11/18 1239  GLUCAP 180* 145* 191*   Resume SSI.  Get A1c .    Hypothyroidism: Resume synthroid.   Hypertension Well controlled.    Ulcerative Colitis:  No issues at this time. Continue with sulfasalazine.    DVT prophylaxis: scd's Code Status: full code.  Family Communication: discussed with the patient the plan. None at bedside.  Disposition Plan: pending CIR evaluation.    Consultants:   Orthopedics.   Cir.   Procedures: Intramedullary nail placement on 4/25.  Antimicrobials: none.   Subjective: Pain controlled when she is non ambulating.   Objective: Vitals:   05/10/18 2000 05/11/18 0054 05/11/18 0622 05/11/18 1130  BP: (!) 151/81 (!) 131/53 (!) 143/66 (!) 132/96  Pulse: 79 70 67 (!) 57  Resp: 14   18  Temp: 97.7 F (36.5 C) 99 F (37.2 C) 99.4 F (37.4 C) 98 F (36.7 C)  TempSrc:  Oral Oral Oral  SpO2: 99% 98% 97% 100%  Weight:      Height:        Intake/Output Summary (Last 24 hours) at 05/11/2018 1314 Last data filed at 05/11/2018 0600 Gross per 24 hour   Intake 1360 ml  Output 100 ml  Net 1260 ml   Filed Weights   05/10/18 1254  Weight: 103 kg    Examination:  General exam: Appears calm and comfortable  Respiratory system: Clear to auscultation. Respiratory effort normal. Cardiovascular system: S1 & S2 heard, RRR. Marland Kitchen No pedal edema. Gastrointestinal system: Abdomen is nondistended, soft and nontender. No organomegaly or masses felt. Normal bowel sounds heard. Central nervous system: Alert and oriented. No focal neurological deficits. Extremities: Symmetric 5 x 5 power. Skin: No rashes, lesions or ulcers Psychiatry: Judgement and insight appear normal. Mood & affect appropriate.     Data Reviewed: I have personally reviewed following labs and imaging studies  CBC: Recent Labs  Lab 05/10/18 1255  WBC 5.5  NEUTROABS 4.3  HGB 13.4  HCT 42.2  MCV 87.7  PLT 115   Basic Metabolic Panel: Recent Labs  Lab 05/10/18 1255  NA 137  K 3.9  CL 103  CO2 22  GLUCOSE 168*  BUN 13  CREATININE 0.95  CALCIUM 9.1   GFR: Estimated Creatinine Clearance: 49.6 mL/min (by C-G formula based on SCr of 0.95 mg/dL). Liver Function Tests: Recent Labs  Lab 05/10/18 1255  AST 16  ALT 11  ALKPHOS 93  BILITOT 0.7  PROT 7.1  ALBUMIN 3.8   No results for input(s): LIPASE, AMYLASE in the last 168 hours.  No results for input(s): AMMONIA in the last 168 hours. Coagulation Profile: No results for input(s): INR, PROTIME in the last 168 hours. Cardiac Enzymes: No results for input(s): CKTOTAL, CKMB, CKMBINDEX, TROPONINI in the last 168 hours. BNP (last 3 results) No results for input(s): PROBNP in the last 8760 hours. HbA1C: No results for input(s): HGBA1C in the last 72 hours. CBG: Recent Labs  Lab 05/10/18 1750 05/10/18 2021 05/11/18 0820 05/11/18 1239  GLUCAP 168* 180* 145* 191*   Lipid Profile: No results for input(s): CHOL, HDL, LDLCALC, TRIG, CHOLHDL, LDLDIRECT in the last 72 hours. Thyroid Function Tests: No results for  input(s): TSH, T4TOTAL, FREET4, T3FREE, THYROIDAB in the last 72 hours. Anemia Panel: No results for input(s): VITAMINB12, FOLATE, FERRITIN, TIBC, IRON, RETICCTPCT in the last 72 hours. Sepsis Labs: No results for input(s): PROCALCITON, LATICACIDVEN in the last 168 hours.  No results found for this or any previous visit (from the past 240 hour(s)).       Radiology Studies: Dg Pelvis Portable  Result Date: 05/10/2018 CLINICAL DATA:  Fall today.  Hip pain. EXAM: PORTABLE PELVIS 1-2 VIEWS COMPARISON:  Pelvic CT 09/28/2017. FINDINGS: The bones appear adequately mineralized. There is an acute intertrochanteric right femur fracture which is mildly comminuted and moderately angulated. The femoral head is located. No evidence pelvic fracture. Mild lower lumbar spondylosis noted. IMPRESSION: Comminuted and angulated intertrochanteric right femur fracture. Electronically Signed   By: Richardean Sale M.D.   On: 05/10/2018 13:56   Dg Chest Portable 1 View  Result Date: 05/10/2018 CLINICAL DATA:  Fall today.  Right hip fracture. EXAM: PORTABLE CHEST 1 VIEW COMPARISON:  Radiographs 09/16/2008. FINDINGS: 1302 hours. Mild right hemidiaphragm elevation with resulting minimal right basilar atelectasis. The lungs are otherwise clear. There is no pleural effusion or pneumothorax. The heart size and mediastinal contours are stable. No acute osseous findings are seen. IMPRESSION: No evidence of acute chest injury or active cardiopulmonary process. Minimal right basilar atelectasis related to right hemidiaphragm elevation. Electronically Signed   By: Richardean Sale M.D.   On: 05/10/2018 13:59   Dg Knee Right Port  Result Date: 05/10/2018 CLINICAL DATA:  Fall EXAM: PORTABLE RIGHT KNEE - 1-2 VIEW COMPARISON:  None. FINDINGS: Partially visualized intramedullary rod within the mid femur. No acute displaced fracture or malalignment. Moderate patellofemoral degenerative change. Advanced degenerative change medial  compartment of the knee. Mild degenerative change of the lateral compartment. Small knee effusion. IMPRESSION: 1. No acute osseous abnormality. 2. Tricompartment arthritis of the knee with small knee effusion Electronically Signed   By: Donavan Foil M.D.   On: 05/10/2018 18:35   Dg C-arm 1-60 Min  Result Date: 05/10/2018 CLINICAL DATA:  Right hip fracture EXAM: DG C-ARM 61-120 MIN; RIGHT FEMUR 2 VIEWS COMPARISON:  05/10/2018 FINDINGS: Five low resolution intraoperative spot views of the right hip. Total fluoroscopy time was 1 minutes 9 seconds. The images demonstrate intramedullary rod and screw fixation of right intertrochanteric fracture. IMPRESSION: Intraoperative fluoroscopic assistance provided during surgical fixation of right hip fracture Electronically Signed   By: Donavan Foil M.D.   On: 05/10/2018 18:32   Dg Femur, Min 2 Views Right  Result Date: 05/10/2018 CLINICAL DATA:  Right hip fracture EXAM: DG C-ARM 61-120 MIN; RIGHT FEMUR 2 VIEWS COMPARISON:  05/10/2018 FINDINGS: Five low resolution intraoperative spot views of the right hip. Total fluoroscopy time was 1 minutes 9 seconds. The images demonstrate intramedullary rod and screw fixation of right intertrochanteric fracture. IMPRESSION: Intraoperative fluoroscopic assistance provided during  surgical fixation of right hip fracture Electronically Signed   By: Donavan Foil M.D.   On: 05/10/2018 18:32   Dg Femur Port, [place] 2 Views Right  Result Date: 05/10/2018 CLINICAL DATA:  Fall today.  Right hip pain. EXAM: RIGHT FEMUR PORTABLE 2 VIEW COMPARISON:  None. FINDINGS: The bones appear adequately mineralized. There is an acute comminuted intertrochanteric right femur fracture which demonstrates moderate apex lateral angulation. The right femoral head is located. There are mild underlying right hip degenerative changes. There are moderate degenerative changes at the right knee. No evidence of acute injury in the distal femur. IMPRESSION:  Comminuted and angulated intertrochanteric right femur fracture. Electronically Signed   By: Richardean Sale M.D.   On: 05/10/2018 13:58        Scheduled Meds: . amLODipine  10 mg Oral Daily  . aspirin EC  81 mg Oral Daily  . brimonidine  1 drop Both Eyes BID  . brinzolamide  1 drop Both Eyes BID  . docusate sodium  100 mg Oral BID  . folic acid  1 mg Oral Daily  . latanoprost  1 drop Both Eyes QHS  . levothyroxine  150 mcg Oral QAC breakfast  . losartan  100 mg Oral Daily  . sulfaSALAzine  1,000 mg Oral BID   Continuous Infusions: . methocarbamol (ROBAXIN) IV       LOS: 1 day    Time spent: [place], MD [facility] Pager [phone] If 7PM-7AM, please contact night-coverage [email or web address] Password [email or web address] 05/11/2018, 1:14 PM

## 2018-05-11 NOTE — Progress Notes (Signed)
Orthopedic Tech Progress Note Patient Details:  Jenna Chambers Jan 23, 1931 [id number] Applied Over Head Frame and trapeze for patient. The bed she was currently on has the old frame on it so I went and go another bed and applied the frame to it and let the RN and [facility] know that she has the new bed outside the room. Patient was currently in the chair. Patient ID: [name], female   DOB: [birth year], 85 y.o.   MRN: [id number]   [name] 05/11/2018, 11:34 AM

## 2018-05-11 NOTE — Evaluation (Signed)
Physical Therapy Evaluation Patient Details Name: Jenna Chambers MRN: [id number] DOB: 16-Apr-1931 Today's Date: 05/11/2018   History of Present Illness  Jenna Chambers is a 85 y.o. female with medical history significant of UC; OSA not on CPAP; HTN; DM; and remote colon CA s/p resection presenting with hip pain s/p mechanical fall.  She saw a bug and tried to step on it and lost her balance and fell backwards. Sustained R hip intertrochanteric fracture; now s/p IMNail, WBAT  Clinical Impression   Patient is s/p above surgery resulting in functional limitations due to the deficits listed below (see PT Problem List). Managing independently prior to this fall; Presents with pain in R hip with weight bearing, limiting her tolerance of standing, transfers, walking;  Patient will benefit from skilled PT to increase their independence and safety with mobility to allow discharge to the venue listed below.    To echo OT, pt feels strongly that she would like to be considered for the post-acute rehab at the CIR level here at [facility] (although I'm not sure that her diagnosis supports a CIR stay). Knowing her PLOF and level of independence, this is reasonable and I feel as though she can participate in 3 hours of therapy a day as well as it will prepare her for not only assisting with ADL (AE education) but IADL that she typically does by herself. Should this not be an option for her, she will need post-acute rehab. Will ask Rehab Admissions Coordinator to weigh in.    Follow Up Recommendations Supervision/Assistance - 24 hour;Other (comment)(Post-acute Rehabilitation)    Equipment Recommendations  None recommended by PT    Recommendations for Other Services       Precautions / Restrictions Precautions Precautions: Fall Restrictions Weight Bearing Restrictions: Yes RLE Weight Bearing: Weight bearing as tolerated      Mobility  Bed Mobility Overal bed mobility: Needs Assistance Bed Mobility: Supine to  Sit     Supine to sit: Mod assist;+2 for physical assistance;+2 for safety/equipment;HOB elevated     General bed mobility comments: assist for BLE to come to EOB, use of bed pad to assist with hips and heavy mod A for trunk elevation. Pt initially light headed with positional changes  Transfers Overall transfer level: Needs assistance Equipment used: Rolling walker (2 wheeled) Transfers: Sit to/from [facility] Sit to Stand: Mod assist;+2 physical assistance;+2 safety/equipment Stand pivot transfers: Mod assist;+2 physical assistance;+2 safety/equipment       General transfer comment: Heavy Mod assist for boost, cues for hand placement, cues for sequencing, assist for balance as well as assist to stay upright; difficulty with R sinle limb stance due to pain; cues to support self on RW  Ambulation/Gait                Stairs            Wheelchair Mobility    Modified Rankin (Stroke Patients Only)       Balance Overall balance assessment: Needs assistance Sitting-balance support: Bilateral upper extremity supported;Feet supported Sitting balance-Leahy Scale: Fair     Standing balance support: Bilateral upper extremity supported Standing balance-Leahy Scale: Poor Standing balance comment: dependent on BUE support in standing                             Pertinent Vitals/Pain Pain Assessment: 0-10 Pain Score: 8  Pain Location: R hip (went up from 2 in supine position with  movement and WB) Pain Descriptors / Indicators: Discomfort;Grimacing;Sore Pain Intervention(s): Monitored during session;Repositioned    Home Living Family/patient expects to be discharged to:: Private residence Living Arrangements: Alone Available Help at Discharge: Family;Available PRN/intermittently(looking into 24 hour caregivers w/long term insurance) Type of Home: House Home Access: Stairs to enter Entrance Stairs-Rails: Right;Left;Can reach  both Entrance Stairs-Number of Steps: 4 Home Layout: One level Home Equipment: Walker - 2 wheels;Cane - single point;Bedside commode;Toilet riser      Prior Function Level of Independence: Independent         Comments: uses a SPC for community, does ADL/IADL but does have a cleaner that comes, enjoys theatre     Hand Dominance        Extremity/Trunk Assessment   Upper Extremity Assessment Upper Extremity Assessment: Defer to OT evaluation;Overall WFL for tasks assessed    Lower Extremity Assessment Lower Extremity Assessment: RLE deficits/detail RLE Deficits / Details: post-op deficits as anticipated with IM nail; grossly decr AROM and strength, limited by pain RLE: Unable to fully assess due to pain RLE Coordination: decreased gross motor       Communication   Communication: No difficulties  Cognition Arousal/Alertness: Awake/alert Behavior During Therapy: WFL for tasks assessed/performed Overall Cognitive Status: Within Functional Limits for tasks assessed                                        General Comments General comments (skin integrity, edema, etc.): Pt's O2 stayed above 92 throughout session with movement. However, O2 replaced after session with Pt in chair    Exercises     Assessment/Plan    PT Assessment Patient needs continued PT services  PT Problem List Decreased strength;Decreased range of motion;Decreased activity tolerance;Decreased balance;Decreased mobility;Decreased coordination;Decreased knowledge of use of DME;Decreased safety awareness;Decreased knowledge of precautions;Pain       PT Treatment Interventions DME instruction;Gait training;Functional mobility training;Therapeutic activities;Therapeutic exercise;Balance training;Patient/family education    PT Goals (Current goals can be found in the Care Plan section)  Acute Rehab PT Goals Patient Stated Goal: To go to CIR PT Goal Formulation: With patient Time For Goal  Achievement: 05/25/18 Potential to Achieve Goals: Good    Frequency Min 3X/week   Barriers to discharge Other (comment) Lives alone; Jenna Chambers mentioned that she does have a long-term care policy, and that if they could arrange for 24 hour help at home, she would prefer going home    Co-evaluation PT/OT/SLP Co-Evaluation/Treatment: Yes Reason for Co-Treatment: For patient/therapist safety PT goals addressed during session: Mobility/safety with mobility OT goals addressed during session: ADL's and self-care       AM-PAC PT "6 Clicks" Mobility  Outcome Measure Help needed turning from your back to your side while in a flat bed without using bedrails?: A Lot Help needed moving from lying on your back to sitting on the side of a flat bed without using bedrails?: A Lot Help needed moving to and from a bed to a chair (including a wheelchair)?: A Lot Help needed standing up from a chair using your arms (e.g., wheelchair or bedside chair)?: A Lot Help needed to walk in hospital room?: Total Help needed climbing 3-5 steps with a railing? : Total 6 Click Score: 10    End of Session Equipment Utilized During Treatment: Gait belt Activity Tolerance: Patient limited by pain Patient left: in chair;with call bell/phone within reach Nurse Communication: Mobility status  PT Visit Diagnosis: Unsteadiness on feet (R26.81);Muscle weakness (generalized) (M62.81);Pain;Difficulty in walking, not elsewhere classified (R26.2) Pain - Right/Left: Right Pain - part of body: Hip    Time: 2446-9507 PT Time Calculation (min) (ACUTE ONLY): 32 min   Charges:   PT Evaluation $PT Eval Moderate Complexity: 1 Mod          Roney Marion, [place]  Acute Rehabilitation Services Pager [phone] Office [phone]   Colletta [place] 05/11/2018, 10:27 AM

## 2018-05-11 NOTE — Anesthesia Postprocedure Evaluation (Signed)
Anesthesia Post Note  Patient: Jenna Chambers  Procedure(s) Performed: INTRAMEDULLARY (IM) NAIL INTERTROCHANTRIC (Right Hip)     Patient location during evaluation: PACU Anesthesia Type: General Level of consciousness: awake Pain management: pain level controlled Vital Signs Assessment: post-procedure vital signs reviewed and stable Respiratory status: spontaneous breathing, nonlabored ventilation, respiratory function stable and patient connected to nasal cannula oxygen Cardiovascular status: blood pressure returned to baseline and stable Postop Assessment: no apparent nausea or vomiting Anesthetic complications: no    Last Vitals:  Vitals:   05/11/18 1130 05/11/18 1423  BP: (!) 132/96 (!) 143/70  Pulse: (!) 57 62  Resp: 18 18  Temp: 36.7 C 36.4 C  SpO2: 100% 99%    Last Pain:  Vitals:   05/11/18 1423  TempSrc: Oral  PainSc: 3                  Ryan P Ellender

## 2018-05-11 NOTE — Progress Notes (Signed)
Received pt alert and oriented x 4. Pt had foam dressing to right hip dry and intact. Oriented pt to room and use of call light. Will monitor pt.

## 2018-05-11 NOTE — Progress Notes (Signed)
Physical Therapy Treatment Patient Details Name: Jenna Chambers MRN: [id number] DOB: [birth year] Today's Date: 05/11/2018    History of Present Illness AMANTHA SKLAR is a 85 y.o. female with medical history significant of UC; OSA not on CPAP; HTN; DM; and remote colon CA s/p resection presenting with hip pain s/p mechanical fall.  She saw a bug and tried to step on it and lost her balance and fell backwards. Sustained R hip intertrochanteric fracture; now s/p Chesley Noon, WBAT    PT Comments    Continuing work on functional mobility and activity tolerance;  Assisted Ms. Coggins with transfer back to bed; performed basic stand pivot transfer reclienr to bed, moving towards her stronger, L side; Noting better tolerance of standing this session; will continue to follow   Follow Up Recommendations  Supervision/Assistance - 24 hour;Other (comment)(Post-acute Rehabilitation)     Equipment Recommendations  None recommended by PT    Recommendations for Other Services       Precautions / Restrictions Precautions Precautions: Fall Restrictions RLE Weight Bearing: Weight bearing as tolerated    Mobility  Bed Mobility Overal bed mobility: Needs Assistance Bed Mobility: Sit to Supine       Sit to supine: Mod assist   General bed mobility comments: Heavy mod assist to help LEs back into bed  Transfers Overall transfer level: Needs assistance Equipment used: Rolling walker (2 wheeled) Transfers: Sit to/from [facility] Sit to Stand: Mod assist;+2 physical assistance;+2 safety/equipment Stand pivot transfers: Mod assist;+2 physical assistance;+2 safety/equipment       General transfer comment: Heavy Mod assist for boost, cues for hand placement, cues for sequencing, assist for balance as well as assist to stay upright; difficulty with R sinle limb stance due to pain; cues to support self on RW  Ambulation/Gait                 Stairs             Wheelchair  Mobility    Modified Rankin (Stroke Patients Only)       Balance     Sitting balance-Leahy Scale: Fair       Standing balance-Leahy Scale: Poor Standing balance comment: dependent on BUE support in standing                            Cognition Arousal/Alertness: Awake/alert Behavior During Therapy: WFL for tasks assessed/performed Overall Cognitive Status: Within Functional Limits for tasks assessed                                        Exercises      General Comments        Pertinent Vitals/Pain Pain Assessment: Faces Faces Pain Scale: Hurts even more Pain Location: R hip  Pain Descriptors / Indicators: Discomfort;Grimacing;Sore Pain Intervention(s): Monitored during session;Repositioned    Home Living                      Prior Function            PT Goals (current goals can now be found in the care plan section) Acute Rehab PT Goals Patient Stated Goal: To go to CIR PT Goal Formulation: With patient Time For Goal Achievement: 05/25/18 Potential to Achieve Goals: Good Progress towards PT goals: Progressing toward goals    Frequency  Min 3X/week      PT Plan Current plan remains appropriate    Co-evaluation              AM-PAC PT "6 Clicks" Mobility   Outcome Measure  Help needed turning from your back to your side while in a flat bed without using bedrails?: A Lot Help needed moving from lying on your back to sitting on the side of a flat bed without using bedrails?: A Lot Help needed moving to and from a bed to a chair (including a wheelchair)?: A Lot Help needed standing up from a chair using your arms (e.g., wheelchair or bedside chair)?: A Lot Help needed to walk in hospital room?: Total Help needed climbing 3-5 steps with a railing? : Total 6 Click Score: 10    End of Session Equipment Utilized During Treatment: Gait belt Activity Tolerance: Patient limited by pain Patient left: in  bed;with call bell/phone within reach;with nursing/sitter in room Nurse Communication: Mobility status PT Visit Diagnosis: Unsteadiness on feet (R26.81);Muscle weakness (generalized) (M62.81);Pain;Difficulty in walking, not elsewhere classified (R26.2) Pain - Right/Left: Right Pain - part of body: Hip     Time: 5885-0277 PT Time Calculation (min) (ACUTE ONLY): 10 min  Charges:  $Therapeutic Activity: 8-22 mins                     Roney Marion, PT  Acute Rehabilitation Services Pager [phone] Office [place] 05/11/2018, 4:19 PM

## 2018-05-11 NOTE — Progress Notes (Deleted)
Rehab Admissions Coordinator Note:  Per PT and OT recommendation, this patient was screened by Jhonnie Garner for appropriateness for an Inpatient Acute Rehab Consult.  Unfortunately, with the pt's current diagnosis, it is unlikely the pt's insurance would approve a CIR stay. Would recommend SNF for post acute rehab at this time.   Jhonnie Garner 05/11/2018, 10:50 AM  I can be reached at [phone].

## 2018-05-11 NOTE — NC FL2 (Signed)
[place] MEDICAID FL2 LEVEL OF CARE SCREENING TOOL     IDENTIFICATION  Patient Name: Jenna Chambers Birthdate: 1932-01-03 Sex: female Admission Date (Current Location): 05/10/2018  [facility] and [place] Number:  Herbalist and Address:  [facility] [place], [place] [address] [place], [place] [address]      Provider Number: [id number]  Attending Physician Name and Address:  Hosie Poisson, MD  Relative Name and Phone Number:       Current Level of Care: Hospital Recommended Level of Care: [place] Prior Approval Number:    Date Approved/Denied:   PASRR Number: [id number]  Discharge Plan: SNF    Current Diagnoses: Patient Active Problem List   Diagnosis Date Noted  . Hip fracture ([place]) 05/10/2018  . Colon cancer ([place]) 02/27/2013  . Essential hypertension, benign 02/27/2013  . Glaucoma 02/27/2013  . Ulcerative colitis ([place]) 02/27/2013  . Diabetes ([place])     Orientation RESPIRATION BLADDER Height & Weight     Situation, Place, Time, Self  O2(Nasal Cannula 2L) Continent Weight: 227 lb (103 kg) Height:  5\' 3"  (160 cm)  BEHAVIORAL SYMPTOMS/MOOD NEUROLOGICAL BOWEL NUTRITION STATUS      Continent Diet(carb modified, thin liquids)  AMBULATORY STATUS COMMUNICATION OF NEEDS Skin   Extensive Assist Verbally Surgical wounds(Closed incision right thigh, adhesive bandage)                       Personal Care Assistance Level of Assistance  Bathing, Feeding, Dressing Bathing Assistance: Maximum assistance Feeding assistance: Limited assistance Dressing Assistance: Maximum assistance     Functional Limitations Info  Hearing, Sight, Speech Sight Info: Adequate Hearing Info: Adequate Speech Info: Adequate    SPECIAL CARE FACTORS FREQUENCY  PT (By licensed PT), OT (By licensed OT)     PT Frequency: 5x OT Frequency: 5x            Contractures Contractures Info: Not present    Additional Factors Info  Code Status,  Allergies, Isolation Precautions Code Status Info: Full Code Allergies Info: Latex, Penicillins     Isolation Precautions Info: Droplet/contact precautions, being tested for COVID-19     Current Medications (05/11/2018):  This is the current hospital active medication list Current Facility-Administered Medications  Medication Dose Route Frequency Provider Last Rate Last Dose  . amLODipine (NORVASC) tablet 10 mg  10 mg Oral Daily [name], MD      . aspirin EC tablet 81 mg  81 mg Oral Daily [name], MD      . bisacodyl (DULCOLAX) EC tablet 5 mg  5 mg Oral Daily PRN [name], MD      . brimonidine (ALPHAGAN) 0.15 % ophthalmic solution 1 drop  1 drop Both Eyes BID [name], MD   1 drop at 05/10/18 2225  . brinzolamide (AZOPT) 1 % ophthalmic suspension 1 drop  1 drop Both Eyes BID [name], MD   1 drop at 05/10/18 2225  . docusate sodium (COLACE) capsule 100 mg  100 mg Oral BID [name], MD   100 mg at 05/10/18 2056  . folic acid (FOLVITE) tablet 1 mg  1 mg Oral Daily [name], MD      . HYDROcodone-acetaminophen (NORCO/VICODIN) 5-325 MG per tablet 1-2 tablet  1-2 tablet Oral Q6H PRN [name], MD   1 tablet at 05/11/18 0545  . latanoprost (XALATAN) 0.005 % ophthalmic solution 1 drop  1  drop Both Eyes QHS Karmen Bongo, MD   1 drop at 05/10/18 2225  . levothyroxine (SYNTHROID) tablet 150 mcg  150 mcg Oral QAC breakfast [name], MD   150 mcg at 05/11/18 0548  . losartan (COZAAR) tablet 100 mg  100 mg Oral Daily [name], MD      . methocarbamol (ROBAXIN) tablet 500 mg  500 mg Oral Q6H PRN [name], MD   500 mg at 05/11/18 0546   Or  . methocarbamol (ROBAXIN) 500 mg in dextrose 5 % 50 mL IVPB  500 mg Intravenous Q6H PRN [name], MD      . metoCLOPramide [facility]) tablet 5-10 mg  5-10 mg Oral Q8H PRN [name], MD       Or  .  metoCLOPramide [facility]) injection 5-10 mg  5-10 mg Intravenous Q8H PRN [name], MD      . morphine 2 MG/ML injection 0.5 mg  0.5 mg Intravenous Q2H PRN [name], MD   0.5 mg at 05/11/18 3734  . ondansetron (ZOFRAN) tablet 4 mg  4 mg Oral Q6H PRN [name], MD       Or  . ondansetron [facility]) injection 4 mg  4 mg Intravenous Q6H PRN [name], MD      . polyethylene glycol [facility] / GLYCOLAX) packet 17 g  17 g Oral Daily PRN [name], MD      . sulfaSALAzine (AZULFIDINE) tablet 1,000 mg  1,000 mg Oral BID [name], MD   1,000 mg at 05/10/18 2225     Discharge Medications: Please see discharge summary for a list of discharge medications.  Relevant Imaging Results:  Relevant Lab Results:   Additional Information SSN: [id number]  Eileen Stanford, LCSW

## 2018-05-11 NOTE — Evaluation (Signed)
Occupational Therapy Evaluation Patient Details Name: Jenna Chambers MRN: [id number] DOB: 09/14/1931 Today's Date: 05/11/2018    History of Present Illness Jenna Chambers is a 85 y.o. female with medical history significant of UC; OSA not on CPAP; HTN; DM; and remote colon CA s/p resection presenting with hip pain s/p mechanical fall.  She saw a bug and tried to step on it and lost her balance and fell backwards. Sustained R hip intertrochanteric fracture; now s/p IMNail, WBAT   Clinical Impression   PTA Pt was independent in ADL and moblity (used cane for community mobility) still doing her own cooking and shopping. Pt today was mod A +2 for bed mobility and stand pivot transfer, set up for UB ADL and max A +2 for LB dressing, max A for LB bathing. Pt will require skilled OT in the acute setting as well as afterwards post-acute to maximize safety and independence in ADL.  Pt feels strongly that she would like to be considered for the post-acute rehab at the CIR level here at [facility]. Knowing her PLOF and level of independence, this is reasonable and I feel as though she can participate in 3 hours of therapy a day as well as it will prepare her for not only assisting with ADL (AE education) but IADL that she typically does by herself. Should this not be an option for her, she will need post-acute rehab.    Follow Up Recommendations  Supervision/Assistance - 24 hour;Follow surgeon's recommendation for DC plan and follow-up therapies;CIR(Pt requesting CIR here at [facility])    Equipment Recommendations  Other (comment);Tub/shower seat(defer to next venue)    Recommendations for Other Services       Precautions / Restrictions Precautions Precautions: Fall Restrictions Weight Bearing Restrictions: Yes RLE Weight Bearing: Weight bearing as tolerated      Mobility Bed Mobility Overal bed mobility: Needs Assistance Bed Mobility: Supine to Sit     Supine to sit: Mod assist;+2 for physical  assistance;+2 for safety/equipment;HOB elevated     General bed mobility comments: assist for BLE to come to EOB, use of bed pad to assist with hips and heavy mod A for trunk elevation. Pt initially light headed with positional changes  Transfers Overall transfer level: Needs assistance Equipment used: Rolling walker (2 wheeled) Transfers: Sit to/from [facility] Sit to Stand: Mod assist;+2 physical assistance;+2 safety/equipment Stand pivot transfers: Mod assist;+2 physical assistance;+2 safety/equipment       General transfer comment: assist for boost, cues for hand placement, cues for sequencing, assist for balance as well as assist to stay upright    Balance Overall balance assessment: Needs assistance Sitting-balance support: Bilateral upper extremity supported;Feet supported Sitting balance-Leahy Scale: Fair     Standing balance support: Bilateral upper extremity supported Standing balance-Leahy Scale: Poor Standing balance comment: dependent on BUE support in standing                           ADL either performed or assessed with clinical judgement   ADL Overall ADL's : Needs assistance/impaired Eating/Feeding: Modified independent;Sitting   Grooming: Set up;Sitting   Upper Body Bathing: Minimal assistance;Sitting   Lower Body Bathing: Maximal assistance;Sitting/lateral leans   Upper Body Dressing : Set up;Sitting   Lower Body Dressing: Maximal assistance;+2 for physical assistance;+2 for safety/equipment;Sit to/from stand Lower Body Dressing Details (indicate cue type and reason): unable to access LB for dressing at this time Toilet Transfer: Moderate assistance;+2 for physical  assistance;+2 for safety/equipment;Stand-pivot;BSC;RW Toilet Transfer Details (indicate cue type and reason): assist for boost, cues for hand placement, cues for sequencing, assist for balance as well as assist to stay upright Toileting- Clothing Manipulation and  Hygiene: Maximal assistance       Functional mobility during ADLs: Moderate assistance;+2 for physical assistance;+2 for safety/equipment;Rolling walker;Cueing for sequencing General ADL Comments: Pt with decreased access to LB for ADL limited by pain and decreased ROM     Vision Baseline Vision/History: Wears glasses Wears Glasses: At all times Patient Visual Report: No change from baseline Vision Assessment?: No apparent visual deficits     Perception     Praxis      Pertinent Vitals/Pain Pain Assessment: 0-10 Pain Score: 8  Pain Location: R hip (went up from 2 in supine position with movement and WB) Pain Descriptors / Indicators: Discomfort;Grimacing;Sore Pain Intervention(s): Monitored during session;Repositioned;Ice applied;Premedicated before session     Hand Dominance     Extremity/Trunk Assessment Upper Extremity Assessment Upper Extremity Assessment: Generalized weakness   Lower Extremity Assessment Lower Extremity Assessment: RLE deficits/detail RLE Deficits / Details: post-op deficits as anticipated with IM nail RLE Coordination: decreased gross motor       Communication Communication Communication: No difficulties   Cognition Arousal/Alertness: Awake/alert Behavior During Therapy: WFL for tasks assessed/performed Overall Cognitive Status: Within Functional Limits for tasks assessed                                     General Comments  Pt's O2 stayed above 92 throughout session with movement. However, O2 replaced after session with Pt in chair    Exercises     Shoulder Instructions      Home Living Family/patient expects to be discharged to:: Private residence Living Arrangements: Alone Available Help at Discharge: Family;Available PRN/intermittently(looking into 24 hour caregivers w/long term insurance) Type of Home: House Home Access: Stairs to enter [employer] of Steps: 4 Entrance Stairs-Rails: Right;Left;Can  reach both Home Layout: One level     Bathroom Shower/Tub: Occupational psychologist: Standard Bathroom Accessibility: Yes How Accessible: Accessible via walker Home Equipment: [employer] - 2 wheels;Cane - single point;Bedside commode;Toilet riser          Prior Functioning/Environment Level of Independence: Independent        Comments: uses a SPC for community, does ADL/IADL but does have a cleaner that comes, enjoys theatre        OT Problem List: Decreased strength;Decreased range of motion;Decreased activity tolerance;Impaired balance (sitting and/or standing);Decreased knowledge of use of DME or AE;Decreased knowledge of precautions;Obesity;Pain      OT Treatment/Interventions: Self-care/ADL training;DME and/or AE instruction;Therapeutic activities;Patient/family education;Balance training    OT Goals(Current goals can be found in the care plan section) Acute Rehab OT Goals Patient Stated Goal: To go to CIR OT Goal Formulation: With patient Time For Goal Achievement: 05/25/18 Potential to Achieve Goals: Good ADL Goals Pt Will Perform Grooming: with supervision;standing Pt Will Perform Lower Body Bathing: with min assist;sit to/from stand Pt Will Perform Lower Body Dressing: with min assist;with adaptive equipment;sit to/from stand Pt Will Transfer to Toilet: with min assist;ambulating Pt Will Perform Toileting - Clothing Manipulation and hygiene: with min assist;sit to/from stand Additional ADL Goal #1: Pt will perform bed mobility prior to engaging in ADL at supervision level  OT Frequency: Min 2X/week   Barriers to D/C: Decreased caregiver support(Pt has long term health  insurance and is looking into 24 hr)  PT has support of neice, and is looking into 24 hour care options that would allow her to go home       Co-evaluation PT/OT/SLP Co-Evaluation/Treatment: Yes Reason for Co-Treatment: For patient/therapist safety;To address functional/ADL transfers PT  goals addressed during session: Mobility/safety with mobility;Balance;Proper use of DME OT goals addressed during session: ADL's and self-care;Proper use of Adaptive equipment and DME      AM-PAC OT "6 Clicks" Daily Activity     Outcome Measure Help from another person eating meals?: None Help from another person taking care of personal grooming?: A Little(seated) Help from another person toileting, which includes using toliet, bedpan, or urinal?: A Lot Help from another person bathing (including washing, rinsing, drying)?: A Lot Help from another person to put on and taking off regular upper body clothing?: A Little Help from another person to put on and taking off regular lower body clothing?: A Lot 6 Click Score: 16   End of Session Equipment Utilized During Treatment: Gait belt;Rolling walker Nurse Communication: Mobility status  Activity Tolerance: Patient tolerated treatment well Patient left: in chair;with call bell/phone within reach(no chair alarm box in room)  OT Visit Diagnosis: Unsteadiness on feet (R26.81);Other abnormalities of gait and mobility (R26.89);History of falling (Z91.81);Muscle weakness (generalized) (M62.81);Pain Pain - Right/Left: Right Pain - part of body: Hip                Time: 2671-2458 OT Time Calculation (min): 32 min Charges:  OT General Charges $OT Visit: 1 Visit OT Evaluation $OT Eval Moderate Complexity: Wade OTR/L Acute Rehabilitation Services Pager: [phone] Office: [place] 05/11/2018, 9:50 AM

## 2018-05-12 ENCOUNTER — Encounter (HOSPITAL_COMMUNITY): Payer: Self-pay | Admitting: Orthopedic Surgery

## 2018-05-12 LAB — BASIC METABOLIC PANEL
Anion gap: 9 (ref 5–15)
BUN: 19 mg/dL (ref 8–23)
CO2: 25 mmol/L (ref 22–32)
Calcium: 8.6 mg/dL — ABNORMAL LOW (ref 8.9–10.3)
Chloride: 101 mmol/L (ref 98–111)
Creatinine, Ser: 1.12 mg/dL — ABNORMAL HIGH (ref 0.44–1.00)
GFR calc Af Amer: 52 mL/min — ABNORMAL LOW (ref >60)
GFR calc non Af Amer: 45 mL/min — ABNORMAL LOW (ref >60)
Glucose, Bld: 132 mg/dL — ABNORMAL HIGH (ref 70–99)
Potassium: 4.3 mmol/L (ref 3.5–5.1)
Sodium: 135 mmol/L (ref 135–145)

## 2018-05-12 LAB — CBC
HCT: 30.3 % — ABNORMAL LOW (ref 36.0–46.0)
Hemoglobin: 9.7 g/dL — ABNORMAL LOW (ref 12.0–15.0)
MCH: 28.2 pg (ref 26.0–34.0)
MCHC: 32 g/dL (ref 30.0–36.0)
MCV: 88.1 fL (ref 80.0–100.0)
Platelets: 142 10*3/uL — ABNORMAL LOW (ref 150–400)
RBC: 3.44 MIL/uL — ABNORMAL LOW (ref 3.87–5.11)
RDW: 13.2 % (ref 11.5–15.5)
WBC: 7.9 10*3/uL (ref 4.0–10.5)
nRBC: 0 % (ref 0.0–0.2)

## 2018-05-12 MED ORDER — POLYETHYLENE GLYCOL 3350 17 G PO PACK
17.0000 g | PACK | Freq: Two times a day (BID) | ORAL | Status: DC
  Administered 2018-05-12 – 2018-05-14 (×5): 17 g via ORAL
  Filled 2018-05-12 (×5): qty 1

## 2018-05-12 MED ORDER — LATANOPROST 0.005 % OP SOLN
1.0000 [drp] | Freq: Every day | OPHTHALMIC | Status: DC
  Administered 2018-05-12 – 2018-05-13 (×2): 1 [drp] via OPHTHALMIC

## 2018-05-12 MED ORDER — TAFLUPROST (PF) 0.0015 % OP SOLN: 1.0000 [drp] | Freq: Every day | OPHTHALMIC | Status: DC

## 2018-05-12 MED ORDER — ADULT MULTIVITAMIN W/MINERALS CH
1.0000 | ORAL_TABLET | Freq: Every day | ORAL | Status: DC
  Administered 2018-05-12 – 2018-05-14 (×3): 1 via ORAL
  Filled 2018-05-12 (×3): qty 1

## 2018-05-12 NOTE — Progress Notes (Signed)
Physical Therapy Treatment Patient Details Name: Jenna Chambers MRN: [id number] DOB: 1931-04-05 Today's Date: 05/12/2018    History of Present Illness Jenna Chambers is a 85 y.o. female with medical history significant of UC; OSA not on CPAP; HTN; DM; and remote colon CA s/p resection presenting with hip pain s/p mechanical fall.  She saw a bug and tried to step on it and lost her balance and fell backwards. Sustained R hip intertrochanteric fracture; now s/p IMNail, WBAT    PT Comments    Pt just transferred from recliner to bed and is too fatigued to attempt further mobility. She agreed to RLE exercises at bed level.   Follow Up Recommendations  Supervision/Assistance - 24 hour;Other (comment);CIR(Post-acute Rehabilitation)     Equipment Recommendations  None recommended by PT    Recommendations for Other Services       Precautions / Restrictions Precautions Precautions: Fall Restrictions Weight Bearing Restrictions: Yes RLE Weight Bearing: Weight bearing as tolerated          Cognition Arousal/Alertness: Awake/alert Behavior During Therapy: WFL for tasks assessed/performed Overall Cognitive Status: Within Functional Limits for tasks assessed                                        Exercises General Exercises - Lower Extremity Ankle Circles/Pumps: AROM;Both;15 reps;Supine Quad Sets: AROM;5 reps;Right Short Arc Quad: AROM;Right;10 reps;Supine Heel Slides: AAROM;Right;10 reps;Supine Hip ABduction/ADduction: AAROM;Right;10 reps;Supine    General Comments        Pertinent Vitals/Pain Pain Assessment: 0-10 Pain Score: 5  Pain Location: R hip  Pain Descriptors / Indicators: Discomfort;Grimacing;Sore Pain Intervention(s): Limited activity within patient's tolerance;Monitored during session;Premedicated before session;Ice applied    Home Living                      Prior Function            PT Goals (current goals can now be found in the  care plan section) Acute Rehab PT Goals Patient Stated Goal: To go to CIR PT Goal Formulation: With patient Time For Goal Achievement: 05/25/18 Potential to Achieve Goals: Good Progress towards PT goals: Progressing toward goals    Frequency    Min 3X/week      PT Plan Current plan remains appropriate    Co-evaluation              AM-PAC PT "6 Clicks" Mobility   Outcome Measure  Help needed turning from your back to your side while in a flat bed without using bedrails?: A Lot Help needed moving from lying on your back to sitting on the side of a flat bed without using bedrails?: A Lot Help needed moving to and from a bed to a chair (including a wheelchair)?: A Lot Help needed standing up from a chair using your arms (e.g., wheelchair or bedside chair)?: A Lot Help needed to walk in hospital room?: Total Help needed climbing 3-5 steps with a railing? : Total 6 Click Score: 10    End of Session Equipment Utilized During Treatment: Gait belt Activity Tolerance: Patient limited by pain;Patient tolerated treatment well Patient left: in bed;with call bell/phone within reach Nurse Communication: Mobility status PT Visit Diagnosis: Unsteadiness on feet (R26.81);Muscle weakness (generalized) (M62.81);Pain;Difficulty in walking, not elsewhere classified (R26.2) Pain - Right/Left: Right Pain - part of body: Hip     Time: 7654-6503 PT  Time Calculation (min) (ACUTE ONLY): 8 min  Charges:  $Therapeutic Exercise: 8-22 mins                     Blondell Reveal Kistler PT 05/12/2018  Acute Rehabilitation Services Pager [phone] Office [phone]

## 2018-05-12 NOTE — Progress Notes (Signed)
Occupational Therapy Treatment Patient Details Name: Jenna Chambers MRN: [id number] DOB: 1931/12/18 Today's Date: 05/12/2018    History of present illness Jenna Chambers is a 85 y.o. female with medical history significant of UC; OSA not on CPAP; HTN; DM; and remote colon CA s/p resection presenting with hip pain s/p mechanical fall.  She saw a bug and tried to step on it and lost her balance and fell backwards. Sustained R hip intertrochanteric fracture; now s/p IMNail, WBAT   OT comments  Pt progressing toward goals.  She requires increased time and with guarded movements due to pain.  During functional transfers, she is fearful to WB through right LE.  Requiring +2 min-mod assist with functional transfers today. +2 mod assist to transition from sitting EOB to supine. Continue to recommend CIR as d/c plan. Will continue to follow acutely.   Follow Up Recommendations  Supervision/Assistance - 24 hour;Follow surgeon's recommendation for DC plan and follow-up therapies;CIR    Equipment Recommendations  Tub/shower seat;Other (comment)(defer to next venue)    Recommendations for Other Services      Precautions / Restrictions Precautions Precautions: Fall Restrictions Weight Bearing Restrictions: Yes RLE Weight Bearing: Weight bearing as tolerated       Mobility Bed Mobility Overal bed mobility: Needs Assistance Bed Mobility: Sit to Supine       Sit to supine: +2 for physical assistance;Mod assist   General bed mobility comments: Assist for trunk and LEs. Pt unable to problem solve how to get back in bed and is very guarded due to pain.  Transfers Overall transfer level: Needs assistance Equipment used: Rolling walker (2 wheeled) Transfers: Sit to/from [facility] Sit to Stand: +2 physical assistance;Mod assist Stand pivot transfers: +2 physical assistance;Min assist       General transfer comment: Pt requires assist for power up. Independent with correct hand  placement for sit<>stand transfer. Cues and assist for upright posture in standing (flexed posture).      Balance Overall balance assessment: Needs assistance Sitting-balance support: Feet supported;No upper extremity supported Sitting balance-Leahy Scale: Good     Standing balance support: Bilateral upper extremity supported Standing balance-Leahy Scale: Poor Standing balance comment: dependent on BUE support in standing                           ADL either performed or assessed with clinical judgement   ADL Overall ADL's : Needs assistance/impaired                         Toilet Transfer: +2 for physical assistance;Moderate assistance;RW Toilet Transfer Details (indicate cue type and reason): simulated with transfer from recliner to chair           General ADL Comments: Pt declining ADLs, stating she has already bathed and brushed teeth. Pt up in chair upon therapist arrival and would like to return to bed, states she has been up approximately 2 hours.      Vision   Vision Assessment?: No apparent visual deficits   Perception     Praxis      Cognition Arousal/Alertness: Awake/alert Behavior During Therapy: WFL for tasks assessed/performed Overall Cognitive Status: Within Functional Limits for tasks assessed  Exercises     Shoulder Instructions       General Comments      Pertinent Vitals/ Pain       Pain Assessment: 0-10 Pain Score: 6  Pain Location: R hip  Pain Descriptors / Indicators: Discomfort;Grimacing;Sore Pain Intervention(s): Limited activity within patient's tolerance;Monitored during session;Repositioned  Home Living                                          Prior Functioning/Environment              Frequency  Min 2X/week        Progress Toward Goals  OT Goals(current goals can now be found in the care plan section)  Progress towards  OT goals: Progressing toward goals  Acute Rehab OT Goals Patient Stated Goal: To go to CIR OT Goal Formulation: With patient Time For Goal Achievement: 05/25/18 Potential to Achieve Goals: Good ADL Goals Pt Will Perform Grooming: with supervision;standing Pt Will Perform Lower Body Bathing: with min assist;sit to/from stand Pt Will Perform Lower Body Dressing: with min assist;with adaptive equipment;sit to/from stand Pt Will Transfer to Toilet: with min assist;ambulating Pt Will Perform Toileting - Clothing Manipulation and hygiene: with min assist;sit to/from stand Additional ADL Goal #1: Pt will perform bed mobility prior to engaging in ADL at supervision level  Plan Discharge plan remains appropriate    Co-evaluation                 AM-PAC OT "6 Clicks" Daily Activity     Outcome Measure   Help from another person eating meals?: None Help from another person taking care of personal grooming?: A Little(seated) Help from another person toileting, which includes using toliet, bedpan, or urinal?: A Lot Help from another person bathing (including washing, rinsing, drying)?: A Lot Help from another person to put on and taking off regular upper body clothing?: A Little Help from another person to put on and taking off regular lower body clothing?: A Lot 6 Click Score: 16    End of Session Equipment Utilized During Treatment: Rolling walker  OT Visit Diagnosis: Unsteadiness on feet (R26.81);Other abnormalities of gait and mobility (R26.89);History of falling (Z91.81);Muscle weakness (generalized) (M62.81);Pain Pain - Right/Left: Right Pain - part of body: Hip   Activity Tolerance Patient limited by pain   Patient Left in bed;with call bell/phone within reach;with bed alarm set   Nurse Communication Mobility status;Patient requests pain meds        Time: 1315-1330 OT Time Calculation (min): 15 min  Charges: OT General Charges $OT Visit: 1 Visit OT  Treatments $Therapeutic Activity: 8-22 mins    Darrol Jump OTR/L [place] [phone] 05/12/2018, 1:36 PM

## 2018-05-12 NOTE — Progress Notes (Addendum)
Initial Nutrition Assessment  RD working remotely.  DOCUMENTATION CODES:   Morbid obesity  INTERVENTION:   -MVI with minerals daily  NUTRITION DIAGNOSIS:   Increased nutrient needs related to post-op healing as evidenced by estimated needs.  GOAL:   Patient will meet greater than or equal to 90% of their needs  MONITOR:   PO intake, Supplement acceptance, Labs, Weight trends, Skin, I & O's  REASON FOR ASSESSMENT:   Consult Hip fracture protocol  ASSESSMENT:   Jenna Chambers is a 85 y.o. female with medical history significant of UC; OSA not on CPAP; HTN; DM; and remote colon CA s/p resection presenting with hip pain s/p mechanical fall.  She saw a bug and tried to step on it and lost her balance and fell backwards.  She hurt her right hip and knew there was problem as soon as she fell.  She was unable get up.    Pt admitted with rt hip fracture s/p mechanical fall.   4/25- s/p Procedure(s): INTRAMEDULLARY (IM) NAIL INTERTROCHANTRIC (Right)  Reviewed I/O's: -1 L x 24 hours and +260 ml since admission  Spoke with pt on phone, who was pleasant and in good spirits today. She reports good appetite and consuming most of food provided to her since hospitalization- she reports consuming 100% of oatmeal, muffin, and hot tea for breakfast. Pt reports PTA she has a fair appetite- she shares she does not eat as much as she used to as she is not as active. She consumes 3 meals per day (Breakfast: oatmeal OR cereal, yogurt, hot tea; Lunch: hummus and pita chips, fruit (cup of applesauce or peaches); Dinner: chicken with starch and vegetable OR sandwich).   Pt reports UBW is around 227#. She denies any recent weight loss and reports her wt has been fluctuating around 1-2 pounds over the past year.   Pt reports good progress with therapy; just transferred to chair with assistance of mobility tech per her report. RD discussed with pt importance of good meal completion to support healing. Pt  with no additional nutrition-related questions or concerns at this time, expressed appreciation for RD checking in on her.   Medications reviewed and include folic acid.   Lab Results  Component Value Date   HGBA1C 6.3 (H) 05/10/2018   PTA DM medications are none.   Labs reviewed: CBGS: [id number] (inpatient orders for glycemic control are none).   NUTRITION - FOCUSED PHYSICAL EXAM:    Most Recent Value  Orbital Region  Unable to assess  Upper Arm Region  Unable to assess  Thoracic and Lumbar Region  Unable to assess  Buccal Region  Unable to assess  Temple Region  Unable to assess  Clavicle Bone Region  Unable to assess  Clavicle and Acromion Bone Region  Unable to assess  Scapular Bone Region  Unable to assess  Dorsal Hand  Unable to assess  Patellar Region  Unable to assess  Anterior Thigh Region  Unable to assess  Posterior Calf Region  Unable to assess  Edema (RD Assessment)  Unable to assess  Hair  Unable to assess  Eyes  Unable to assess  Mouth  Unable to assess  Skin  Unable to assess  Nails  Unable to assess       Diet Order:   Diet Order            Diet Carb Modified Fluid consistency: Thin; Room service appropriate? Yes  Diet effective now  EDUCATION NEEDS:   Education needs have been addressed  Skin:  Skin Assessment: Skin Integrity Issues: Skin Integrity Issues:: Incisions Incisions: rt thigh  Last BM:  05/09/18  Height:   Ht Readings from Last 1 Encounters:  05/10/18 5\' 3"  (1.6 m)    Weight:   Wt Readings from Last 1 Encounters:  05/10/18 103 kg    Ideal Body Weight:  52.3 kg  BMI:  Body mass index is 40.21 kg/m.  Estimated Nutritional Needs:   Kcal:  1600-1800  Protein:  80-95 grams  Fluid:  1.6- 1.8 L    Kellsey Sansone A. Jimmye Norman, RD, LDN, [place] Registered Dietitian II Certified Diabetes Care and Education Specialist Pager: [phone] After hours Pager: [phone]

## 2018-05-12 NOTE — Progress Notes (Signed)
PROGRESS NOTE    Jenna Chambers  ZOX:[id number] DOB: 05/11/1931 DOA: 05/10/2018 PCP: Shirline Frees, MD    Brief Narrative:   Jenna Chambers is a 85 y.o. female with medical history significant of UC; OSA not on CPAP; HTN; DM; and remote colon CA s/p resection presenting with hip pain s/p mechanical fall. She was found to have intertrochanteric fracture on the right. Orthopedics consulted and she underwent  Intramedullary nail placement on 4/25.   Assessment & Plan:   Principal Problem:   Hip fracture ([place]) Active Problems:   Diabetes ([place])   Essential hypertension, benign   Glaucoma   Ulcerative colitis ([place])    Right hip fracture:  Mechanical fall s/p Intramedullary nail placement on 4/25.  Pain control and physical therapy recommending CIR.  Weight bearing on RLE as recommended by orthopedics.    Diabetes mellitus:  CBG (last 3)  Recent Labs    05/11/18 0820 05/11/18 1239 05/11/18 1728  GLUCAP 145* 191* 117*   Resume SSI.   A1c  Is 6.3.    Hypothyroidism: Resume synthroid.   Hypertension Well controlled.    Ulcerative Colitis:  No issues at this time. Continue with sulfasalazine.    Mild renal insufficiency Continue to monitor.   Acute anemia of blood loss from surgery and possible hemodilution.  Baseline hemoglobin around 9?  Last hemoglobin in epic is from 2010 around 9.  Continue to monitor and transfuse to keep it greater than 7.    Glaucoma:  Resume home medications.     DVT prophylaxis: scd's Code Status: full code.  Family Communication: discussed with the patient the plan. None at bedside.  Disposition Plan: pending CIR evaluation.    Consultants:   Orthopedics.   Cir.   Procedures: Intramedullary nail placement on 4/25.  Antimicrobials: none.   Subjective: Pain controlled when she is non ambulating.  No new complaints.   Objective: Vitals:   05/11/18 0622 05/11/18 1130 05/11/18 1423 05/12/18 0442  BP: (!) 143/66 (!)  132/96 (!) 143/70 (!) 154/52  Pulse: 67 (!) 57 62 64  Resp:  18 18   Temp: 99.4 F (37.4 C) 98 F (36.7 C) 97.6 F (36.4 C) 99.5 F (37.5 C)  TempSrc: Oral Oral Oral Oral  SpO2: 97% 100% 99% 100%  Weight:      Height:        Intake/Output Summary (Last 24 hours) at 05/12/2018 1206 Last data filed at 05/12/2018 0500 Gross per 24 hour  Intake -  Output 1000 ml  Net -1000 ml   Filed Weights   05/10/18 1254  Weight: 103 kg    Examination:  General exam: Appears calm and comfortable  Respiratory system: Clear to auscultation. Respiratory effort normal. Cardiovascular system: S1 & S2 heard, RRR. Marland Kitchen No pedal edema. Gastrointestinal system: Abdomen is nondistended, soft and nontender. No organomegaly or masses felt. Normal bowel sounds heard. Central nervous system: Alert and oriented. No focal neurological deficits. Extremities: Symmetric 5 x 5 power. Skin: No rashes, lesions or ulcers Psychiatry: Judgement and insight appear normal. Mood & affect appropriate.     Data Reviewed: I have personally reviewed following labs and imaging studies  CBC: Recent Labs  Lab 05/10/18 1255 05/12/18 0224  WBC 5.5 7.9  NEUTROABS 4.3  --   HGB 13.4 9.7*  HCT 42.2 30.3*  MCV 87.7 88.1  PLT 159 811*   Basic Metabolic Panel: Recent Labs  Lab 05/10/18 1255 05/12/18 0224  NA 137 135  K 3.9  4.3  CL 103 101  CO2 22 25  GLUCOSE 168* 132*  BUN 13 19  CREATININE 0.95 1.12*  CALCIUM 9.1 8.6*   GFR: Estimated Creatinine Clearance: 42.1 mL/min (A) (by C-G formula based on SCr of 1.12 mg/dL (H)). Liver Function Tests: Recent Labs  Lab 05/10/18 1255  AST 16  ALT 11  ALKPHOS 93  BILITOT 0.7  PROT 7.1  ALBUMIN 3.8   No results for input(s): LIPASE, AMYLASE in the last 168 hours. No results for input(s): AMMONIA in the last 168 hours. Coagulation Profile: No results for input(s): INR, PROTIME in the last 168 hours. Cardiac Enzymes: No results for input(s): CKTOTAL, CKMB,  CKMBINDEX, TROPONINI in the last 168 hours. BNP (last 3 results) No results for input(s): PROBNP in the last 8760 hours. HbA1C: Recent Labs    05/10/18 1255  HGBA1C 6.3*   CBG: Recent Labs  Lab 05/10/18 1750 05/10/18 2021 05/11/18 0820 05/11/18 1239 05/11/18 1728  GLUCAP 168* 180* 145* 191* 117*   Lipid Profile: No results for input(s): CHOL, HDL, LDLCALC, TRIG, CHOLHDL, LDLDIRECT in the last 72 hours. Thyroid Function Tests: No results for input(s): TSH, T4TOTAL, FREET4, T3FREE, THYROIDAB in the last 72 hours. Anemia Panel: No results for input(s): VITAMINB12, FOLATE, FERRITIN, TIBC, IRON, RETICCTPCT in the last 72 hours. Sepsis Labs: No results for input(s): PROCALCITON, LATICACIDVEN in the last 168 hours.  Recent Results (from the past 240 hour(s))  Novel Coronavirus, NAA (hospital order; send-out to ref lab)     Status: None   Collection Time: 05/10/18  3:08 PM  Result Value Ref Range Status   SARS-CoV-2, NAA NOT DETECTED NOT DETECTED Final    Comment: (NOTE) This test was developed and its performance characteristics determined by [employer]. This test has not been FDA cleared or approved. This test has been authorized by FDA under an Emergency Use Authorization (EUA). This test has been validated in accordance with the FDA's Guidance Document (Policy for [place] in Laboratories Certified to Perform High Complexity Testing under CLIA prior to Emergency Use Authorization for Coronavirus [id number] during the [facility] Emergency) issued on February 29th, 2020. FDA independent review of this validation is pending. This test is only authorized for the duration of time the declaration that circumstances exist justifying the authorization of the emergency use of in vitro diagnostic tests for detection of SARS-CoV- 2 virus and/or diagnosis of COVID-19 infection under section 564(b)(1) of the Act, 21 U.S.C. 989QJJ-9(E)(1), unless the  authorization is terminated or revoked sooner. Performed At: [facility] [address] [place], [place] [id number] Rush Farmer MD EH:[id number]    [place]  Final    Comment: Performed at [facility], [place] [address], [place],  [address]         Radiology Studies: Dg Pelvis Portable  Result Date: 05/10/2018 CLINICAL DATA:  Fall today.  Hip pain. EXAM: PORTABLE PELVIS 1-2 VIEWS COMPARISON:  Pelvic CT 09/28/2017. FINDINGS: The bones appear adequately mineralized. There is an acute intertrochanteric right femur fracture which is mildly comminuted and moderately angulated. The femoral head is located. No evidence pelvic fracture. Mild lower lumbar spondylosis noted. IMPRESSION: Comminuted and angulated intertrochanteric right femur fracture. Electronically Signed   By: Richardean Sale M.D.   On: 05/10/2018 13:56   Dg Chest Portable 1 View  Result Date: 05/10/2018 CLINICAL DATA:  Fall today.  Right hip fracture. EXAM: PORTABLE CHEST 1 VIEW COMPARISON:  Radiographs 09/16/2008. FINDINGS: 1302 hours. Mild right hemidiaphragm  elevation with resulting minimal right basilar atelectasis. The lungs are otherwise clear. There is no pleural effusion or pneumothorax. The heart size and mediastinal contours are stable. No acute osseous findings are seen. IMPRESSION: No evidence of acute chest injury or active cardiopulmonary process. Minimal right basilar atelectasis related to right hemidiaphragm elevation. Electronically Signed   By: Richardean Sale M.D.   On: 05/10/2018 13:59   Dg Knee Right Port  Result Date: 05/10/2018 CLINICAL DATA:  Fall EXAM: PORTABLE RIGHT KNEE - 1-2 VIEW COMPARISON:  None. FINDINGS: Partially visualized intramedullary rod within the mid femur. No acute displaced fracture or malalignment. Moderate patellofemoral degenerative change. Advanced degenerative change medial compartment of the knee. Mild degenerative change of the  lateral compartment. Small knee effusion. IMPRESSION: 1. No acute osseous abnormality. 2. Tricompartment arthritis of the knee with small knee effusion Electronically Signed   By: Donavan Foil M.D.   On: 05/10/2018 18:35   Dg C-arm 1-60 Min  Result Date: 05/10/2018 CLINICAL DATA:  Right hip fracture EXAM: DG C-ARM 61-120 MIN; RIGHT FEMUR 2 VIEWS COMPARISON:  05/10/2018 FINDINGS: Five low resolution intraoperative spot views of the right hip. Total fluoroscopy time was 1 minutes 9 seconds. The images demonstrate intramedullary rod and screw fixation of right intertrochanteric fracture. IMPRESSION: Intraoperative fluoroscopic assistance provided during surgical fixation of right hip fracture Electronically Signed   By: Donavan Foil M.D.   On: 05/10/2018 18:32   Dg Femur, Min 2 Views Right  Result Date: 05/10/2018 CLINICAL DATA:  Right hip fracture EXAM: DG C-ARM 61-120 MIN; RIGHT FEMUR 2 VIEWS COMPARISON:  05/10/2018 FINDINGS: Five low resolution intraoperative spot views of the right hip. Total fluoroscopy time was 1 minutes 9 seconds. The images demonstrate intramedullary rod and screw fixation of right intertrochanteric fracture. IMPRESSION: Intraoperative fluoroscopic assistance provided during surgical fixation of right hip fracture Electronically Signed   By: Donavan Foil M.D.   On: 05/10/2018 18:32   Dg Femur [place] 2 Views Right  Result Date: 05/10/2018 CLINICAL DATA:  Fall today.  Right hip pain. EXAM: RIGHT FEMUR PORTABLE 2 VIEW COMPARISON:  None. FINDINGS: The bones appear adequately mineralized. There is an acute comminuted intertrochanteric right femur fracture which demonstrates moderate apex lateral angulation. The right femoral head is located. There are mild underlying right hip degenerative changes. There are moderate degenerative changes at the right knee. No evidence of acute injury in the distal femur. IMPRESSION: Comminuted and angulated intertrochanteric right femur fracture.  Electronically Signed   By: Richardean Sale M.D.   On: 05/10/2018 13:58        Scheduled Meds: . amLODipine  10 mg Oral Daily  . aspirin EC  81 mg Oral Daily  . brimonidine  1 drop Both Eyes BID  . brinzolamide  1 drop Both Eyes BID  . docusate sodium  100 mg Oral BID  . folic acid  1 mg Oral Daily  . latanoprost  1 drop Both Eyes QHS  . levothyroxine  150 mcg Oral QAC breakfast  . losartan  100 mg Oral Daily  . multivitamin with minerals  1 tablet Oral Daily  . sulfaSALAzine  1,000 mg Oral BID   Continuous Infusions: . methocarbamol (ROBAXIN) IV       LOS: 2 days    Time spent: 30 minutes.     Hosie Poisson, MD [facility] Pager [phone] If 7PM-7AM, please contact night-coverage [email or web address] Password [facility] 05/12/2018, 12:06 PM

## 2018-05-13 DIAGNOSIS — E66813 Obesity, class 3: Secondary | ICD-10-CM

## 2018-05-13 DIAGNOSIS — D62 Acute posthemorrhagic anemia: Secondary | ICD-10-CM

## 2018-05-13 DIAGNOSIS — H409 Unspecified glaucoma: Secondary | ICD-10-CM

## 2018-05-13 DIAGNOSIS — S72144A Nondisplaced intertrochanteric fracture of right femur, initial encounter for closed fracture: Secondary | ICD-10-CM

## 2018-05-13 LAB — BASIC METABOLIC PANEL
Anion gap: 12 (ref 5–15)
BUN: 16 mg/dL (ref 8–23)
CO2: 25 mmol/L (ref 22–32)
Calcium: 9.3 mg/dL (ref 8.9–10.3)
Chloride: 97 mmol/L — ABNORMAL LOW (ref 98–111)
Creatinine, Ser: 0.96 mg/dL (ref 0.44–1.00)
GFR calc Af Amer: >60 mL/min (ref >60)
GFR calc non Af Amer: 54 mL/min — ABNORMAL LOW (ref >60)
Glucose, Bld: 198 mg/dL — ABNORMAL HIGH (ref 70–99)
Potassium: 4 mmol/L (ref 3.5–5.1)
Sodium: 134 mmol/L — ABNORMAL LOW (ref 135–145)

## 2018-05-13 LAB — CBC
HCT: 32.9 % — ABNORMAL LOW (ref 36.0–46.0)
Hemoglobin: 10.7 g/dL — ABNORMAL LOW (ref 12.0–15.0)
MCH: 28.4 pg (ref 26.0–34.0)
MCHC: 32.5 g/dL (ref 30.0–36.0)
MCV: 87.3 fL (ref 80.0–100.0)
Platelets: 165 10*3/uL (ref 150–400)
RBC: 3.77 MIL/uL — ABNORMAL LOW (ref 3.87–5.11)
RDW: 13 % (ref 11.5–15.5)
WBC: 9.9 10*3/uL (ref 4.0–10.5)
nRBC: 0 % (ref 0.0–0.2)

## 2018-05-13 MED ORDER — ACETAMINOPHEN 325 MG PO TABS
650.0000 mg | ORAL_TABLET | Freq: Four times a day (QID) | ORAL | Status: DC | PRN
  Administered 2018-05-13 – 2018-05-14 (×2): 650 mg via ORAL
  Filled 2018-05-13 (×2): qty 2

## 2018-05-13 NOTE — TOC Progression Note (Addendum)
Transition of Care [facility]) - Progression Note    Patient Details  Name: GABRELLA STROH MRN: [id number] Date of Birth: 05-22-1931  Transition of Care [facility]) CM/SW [place], [place] Phone Number: 05/13/2018, 1:24 PM  Clinical Narrative:    Spoke with pt niece [name] her preferences are as follows 1. CIR 2. Home with Home Health  3. SNF  Pt has a 3 in one, shower bench, walker; she lives in a one level home and is able to stay in a different area of the home that is more accessible. At this time pt niece [name] is unsure about a wheelchair.   We discussed that I am unable to provide a list of private aide/sitter services but I did provide the following information to her email as requested: [email or web address].  Hi [name],  This is Michiel Cowboy with the department of Social Work at [employer], whom you spoke with this afternoon about rehab placement and home health.  1. I have attached a packet of home health agencies, along with their star ratings according to the Centers for Medicare and Medicaid. We encourage you to pick a top two in case your first choice is does not have needed staffing for the referral.  2. At this time [facility] also has the following offers in bold below for short term rehab (also referred to as a Skilled Nursing Facility/SNF) to choose from- we encourage you to utilize this website to review Medicare star ratings of these facilities: http://church.[email or web address]/  Unfortunately many rehab facilities as we mentioned are not taking referrals during this pandemic. This list may change (certain rehab facilities that offered may have filled their beds) and because of this we encourage you to pick at least a top two preferences for rehab in case your first choice is not available.   I will be on until 4:30pm, my voicemail is secure and you can also email back any questions. Thank you! [place]  [place], [place] [address]  [phone]   [facility] Accepted  [place] [address] [place] [address]  [phone]   [facility] Preferred SNF Accepted  [address] [place] [address]  [phone]   [facility] Preferred SNF Accepted  [address], [place] [address]  [phone]   [facility] Preferred SNF Accepted  [address], [place] [address]  [phone]   [facility] Preferred SNF Accepted  [address], [place] [address] [phone]        Expected Discharge Plan: [place] Barriers to Discharge: Continued Medical Work up  Expected Discharge Plan and Services Expected Discharge Plan: [place] In-house Referral: Clinical Social Work   Post Acute Care Choice: Durable Medical Equipment, Home Health Living arrangements for the past 2 months: Single Family Home                   Social Determinants of Health (SDOH) Interventions    Readmission Risk Interventions No flowsheet data found.

## 2018-05-13 NOTE — Progress Notes (Signed)
   Subjective:  Patient reports pain as mild to moderate.  Doing well with just complaints of continued knee pain and hip pain.  Sitting up in the chair.  Has decided on SNF over CIR.  Denies numbness or tingling, denies CP, SOB.  Objective:   VITALS:   Vitals:   05/12/18 0442 05/12/18 1441 05/12/18 2001 05/13/18 0340  BP: (!) 154/52 (!) 138/46 (!) 131/46 (!) 156/53  Pulse: 64 69 73 68  Resp:  18 15 14   Temp: 99.5 F (37.5 C) 98.8 F (37.1 C) 99.6 F (37.6 C) 99.3 F (37.4 C)  TempSrc: Oral Oral Oral Oral  SpO2: 100% 99% 93% 96%  Weight:      Height:        Neurologically intact Neurovascular intact Sensation intact distally Intact pulses distally Dorsiflexion/Plantar flexion intact Incision: c/d/i, proximal bandage peeling off.  distal one is intact No cellulitis present Compartment soft   Lab Results  Component Value Date   WBC 7.9 05/12/2018   HGB 9.7 (L) 05/12/2018   HCT 30.3 (L) 05/12/2018   MCV 88.1 05/12/2018   PLT 142 (L) 05/12/2018   BMET    Component Value Date/Time   NA 135 05/12/2018 0224   K 4.3 05/12/2018 0224   CL 101 05/12/2018 0224   CO2 25 05/12/2018 0224   GLUCOSE 132 (H) 05/12/2018 0224   BUN 19 05/12/2018 0224   CREATININE 1.12 (H) 05/12/2018 0224   CALCIUM 8.6 (L) 05/12/2018 0224   GFRNONAA 45 (L) 05/12/2018 0224   GFRAA 52 (L) 05/12/2018 0224     Assessment/Plan: 3 Days Post-Op   Principal Problem:   Hip fracture ([facility]) Active Problems:   Diabetes ([facility])   Essential hypertension, benign   Glaucoma   Ulcerative colitis ([facility])   Acute blood loss anemia   Obesity, Class III, BMI 40-49.9 (morbid obesity) ([facility])   Up with therapy - WBAT RLE - knee xxrays again reviewed with pt, and are negative except for the pre-existing OA  - daily ASA for DVT ppx as before surgery  - will need follow up with me in 2 weeks  - ok for daily dry dressing to the proximal incisions, but would maintain the distal of the two.   [name] 05/13/2018, 12:17 PM   Geralynn Rile, MD [phone]

## 2018-05-13 NOTE — Progress Notes (Signed)
Physical Therapy Treatment Patient Details Name: Jenna Chambers MRN: [id number] DOB: 1931-06-23 Today's Date: 05/13/2018    History of Present Illness Jenna Chambers is a 85 y.o. female with medical history significant of UC; OSA not on CPAP; HTN; DM; and remote colon CA s/p resection presenting with hip pain s/p mechanical fall.  She saw a bug and tried to step on it and lost her balance and fell backwards. Sustained R hip intertrochanteric fracture; now s/p IMNail, WBAT    PT Comments    Pt presents with LE weakness R>L, anxiety with mobility, and R hip pain. Pt able to take steps from recliner to bed, presenting with RLE mild buckling corrected with PT quad facilitation. Pt performed LE exercises well on RLE. Pt was mostly limited by anxiety s/p R hip surgery in PT's opinion, so pt spent time working on pre-gait activities to increase pt confidence in RLE. PT to continue to progress mobility as able.    Follow Up Recommendations  Supervision/Assistance - 24 hour;Other (comment);CIR(Post-acute Rehabilitation)     Equipment Recommendations  None recommended by PT    Recommendations for Other Services       Precautions / Restrictions Precautions Precautions: Fall Restrictions Weight Bearing Restrictions: Yes RLE Weight Bearing: Weight bearing as tolerated    Mobility  Bed Mobility Overal bed mobility: Needs Assistance Bed Mobility: Sit to Supine       Sit to supine: HOB elevated;Mod assist   General bed mobility comments: Pt up in chair upon PT arrival to room. Mod assist for LE lifting and translation into bed for sit to supine. Verbal cuing for lowering onto R elbow and assisting in lifting LEs into bed.   Transfers Overall transfer level: Needs assistance Equipment used: Rolling walker (2 wheeled) Transfers: Sit to/from Stand Sit to Stand: Mod assist         General transfer comment: Mod assist for power up, steadying upon standing, stabilizing pt's RLE as pt with  tendency for mild buckling. PT worked on Jenna Chambers prior to initiating ambulation with pt, and with stepping forward and back with RLE. Pt breathing quickly and reported pain, improved with increased time standing.   Ambulation/Gait Ambulation/Gait assistance: Min assist Gait Distance (Feet): 3 Feet Assistive device: Rolling walker (2 wheeled) Gait Pattern/deviations: Step-to pattern;Decreased step length - right;Antalgic;Trunk flexed     General Gait Details: Min assist for steadying, RLE guarding as pt with tendency to mildly buckle on RLE. PT instructed pt to step R foot forward, push through UEs, then step L foot forward. Pt requesting going back to bed after 3 ft ambulation due to pain and fatigue.    Stairs             Wheelchair Mobility    Modified Rankin (Stroke Patients Only)       Balance Overall balance assessment: Needs assistance Sitting-balance support: Feet supported;No upper extremity supported Sitting balance-Leahy Scale: Good     Standing balance support: Bilateral upper extremity supported Standing balance-Leahy Scale: Poor Standing balance comment: dependent on BUE support in standing. Pt instructed in pre-gait activities of weight shifting L and R, stepping forward and back with RLE. Pt stood ~1 minute prior to initiating ambulation.                             Cognition Arousal/Alertness: Awake/alert Behavior During Therapy: WFL for tasks assessed/performed;Anxious Overall Cognitive Status: Within Functional Limits for tasks  assessed                                 General Comments: Pt anxious about mobility, tachypnea with exertion.       Exercises General Exercises - Lower Extremity Ankle Circles/Pumps: AROM;Both;15 reps;Supine Quad Sets: AROM;Right;10 reps;Supine Heel Slides: AAROM;Right;10 reps;Supine Hip ABduction/ADduction: AAROM;Right;10 reps;Supine Straight Leg Raises: AAROM;Right;5  reps;Supine    General Comments        Pertinent Vitals/Pain Pain Assessment: 0-10 Pain Score: 5  Pain Location: R hip  Pain Descriptors / Indicators: Discomfort;Grimacing;Sore Pain Intervention(s): Monitored during session;Repositioned;Limited activity within patient's tolerance;Premedicated before session;Ice applied    Home Living                      Prior Function            PT Goals (current goals can now be found in the care plan section) Acute Rehab PT Goals Patient Stated Goal: To go to CIR PT Goal Formulation: With patient Time For Goal Achievement: 05/25/18 Potential to Achieve Goals: Good Progress towards PT goals: Progressing toward goals    Frequency    Min 3X/week      PT Plan Current plan remains appropriate    Co-evaluation              AM-PAC PT "6 Clicks" Mobility   Outcome Measure  Help needed turning from your back to your side while in a flat bed without using bedrails?: A Lot Help needed moving from lying on your back to sitting on the side of a flat bed without using bedrails?: A Lot Help needed moving to and from a bed to a chair (including a wheelchair)?: A Lot Help needed standing up from a chair using your arms (e.g., wheelchair or bedside chair)?: A Lot Help needed to walk in hospital room?: A Little Help needed climbing 3-5 steps with a railing? : Total 6 Click Score: 12    End of Session Equipment Utilized During Treatment: Gait belt Activity Tolerance: Patient limited by pain;Patient limited by fatigue Patient left: in bed;with call bell/phone within reach Nurse Communication: Mobility status PT Visit Diagnosis: Unsteadiness on feet (R26.81);Muscle weakness (generalized) (M62.81);Pain;Difficulty in walking, not elsewhere classified (R26.2) Pain - Right/Left: Right Pain - part of body: Hip     Time: 6415-8309 PT Time Calculation (min) (ACUTE ONLY): 24 min  Charges:  $Gait Training: 8-22 mins $Therapeutic  Exercise: 8-22 mins                    Julien Girt, PT Acute Rehabilitation Services Pager [phone]  Office [phone]    Jenna Chambers D Jenna Chambers 05/13/2018, 2:48 PM

## 2018-05-13 NOTE — Progress Notes (Signed)
Inpatient Rehabilitation Admissions Coordinator  Inpatient Rehab Consult received. I met with patient at the bedside for rehabilitation assessment. We discussed goals and expectations of an inpatient rehab admission.  I then contacted her niece, Linda, by phone per her request to also discuss her rehab venue options. It is doubtful that [employer] will approve an inpt rehab admission for this diagnosis. Pt and niece prefer inpt rehab and if not approved or bed available, they would like to plan d/c home with max HH and would like a list of agencies to arrange private sitter care at home. I will begin insurance approval with [employer] and contact SW, Isabel to assist with providing family home care agency choices. They refuse SNF at this time due to the Covid issues in SNF's.   , RN, MSN Rehab Admissions Coordinator [phone] 05/13/2018 10:26 AM    

## 2018-05-13 NOTE — TOC Progression Note (Signed)
Transition of Care [facility]) - Progression Note    Patient Details  Name: Jenna Chambers MRN: [id number] Date of Birth: 1931/02/17  Transition of Care [facility]) CM/SW [place], [place] Phone Number: 05/13/2018, 11:04 AM  Clinical Narrative:    Aware that pt and pt niece are hopful for CIR however if that is not approved by insurance they are currently refusing SNF and requesting pt dc home with home health. I will provide pt with post acute home health list to look over while waiting to hear from CIR   Expected Discharge Plan: [place] Barriers to Discharge: Continued Medical Work up  Expected Discharge Plan and Services Expected Discharge Plan: [place] In-house Referral: Clinical Social Work   Post Acute Care Choice: Durable Medical Equipment, [place] arrangements for the past 2 months: Single Family Home          Social Determinants of Health (SDOH) Interventions    Readmission Risk Interventions No flowsheet data found.

## 2018-05-13 NOTE — Progress Notes (Signed)
PROGRESS NOTE  Jenna Chambers KKX:[id number] DOB: 1931-04-26 DOA: 05/10/2018 PCP: Shirline Frees, MD  Brief History   87yow presented with right hip pain s/p mechanical fall. Admitted for right hip fx.  A & P  Right hip fracture s/p mechanical fall --s/p operative fixation 4/25 --per DR Stann Mainland:  WBAT  Return 2 weeks staple removal Daily ASA --CIR being considered --Patient now reconsidered SNF if CIR declined by insurance  ABLA  --asymptomatic --repeat CBC today --pt would accept transfusion if recommended  DM type 2, diet-controlled --CBG stable  Glaucoma OA --continue brimonidine, brinzolamide, latanoprost  Hypothyroidism --continue lebothyroxine  Essential HTN --continue amlodipline, losartan  Ulcerative colitis  --continue sulfasalazine  Morbid obesity --Body mass index is 40.21 kg/m.  --per dietician   F/u CBC, Hgb; if stable, then stable for d/c to CIR  DVT prophylaxis: ASA per orthopedics Code Status: Full Family Communication: none; discussed plan with pt; pt has capacity Disposition Plan: CIR?    Murray Hodgkins, MD  [facility] Direct contact: see [email or web address] (further directions at bottom of note if needed) 7PM-7AM contact night coverage as at bottom of note 05/13/2018, 12:04 PM  LOS: 3 days   Consultants  . Orthopedics   Procedures   PROCEDURE: Treatment of intertrochanteric, pertrochanteric, subtrochanteric fracture with intramedullary implant. CPT [id number]   Antibiotics  .   Interval History/Subjective  Has pain in RLE especially with movement; some nausea after pain medication early this AM. Breathing fine. Poor appetite. No vomiting. Slow transfer bed to chair per RN.  Objective   Vitals:  Vitals:   05/12/18 2001 05/13/18 0340  BP: (!) 131/46 (!) 156/53  Pulse: 73 68  Resp: 15 14  Temp: 99.6 F (37.6 C) 99.3 F (37.4 C)  SpO2: 93% 96%    Exam:  Constitutional:  . Appears calm and comfortable sitting in chair Eyes:   .  irises appear normal . Normal lids  ENMT:  . grossly normal hearing  . Lips appear normal Respiratory:  . CTA bilaterally, no w/r/r.  . Respiratory effort normal.  Cardiovascular:  . RRR, no m/r/g . No LE extremity edema   Musculoskeletal:  . RLE, LLE   . tone grossly normal . Decreased strength RLE secondary to pain; able to dorsiflex ankle Skin:  . Right thigh bandaged; appears c/w surgery Neurologic:  . Gross sensation right foot intact Psychiatric:  . Mental status o Mood, affect appropriate o Speech fluent and clear  I have personally reviewed the following:   Today's Data  . None ordered . Have ordered BMP, CBC  Lab Data  . Reviewed admit CBC, BMP  . Hgb A1c 6.3  Micro Data  .   Imaging  . Independent review CXR on admit no acute disease  Cardiology Data  .   Other Data  .   Scheduled Meds: . amLODipine  10 mg Oral Daily  . aspirin EC  81 mg Oral Daily  . brimonidine  1 drop Both Eyes BID  . brinzolamide  1 drop Both Eyes BID  . docusate sodium  100 mg Oral BID  . folic acid  1 mg Oral Daily  . latanoprost  1 drop Both Eyes QHS  . levothyroxine  150 mcg Oral QAC breakfast  . losartan  100 mg Oral Daily  . multivitamin with minerals  1 tablet Oral Daily  . polyethylene glycol  17 g Oral BID  . sulfaSALAzine  1,000 mg Oral BID   Continuous Infusions: . methocarbamol (ROBAXIN) IV  Principal Problem:   Hip fracture ([place]) Active Problems:   Diabetes ([place])   Essential hypertension, benign   Glaucoma   Ulcerative colitis ([place])   Acute blood loss anemia   Obesity, Class III, BMI 40-49.9 (morbid obesity) ([place])   LOS: 3 days   How to contact the [facility] Attending or Consulting provider [place] or covering provider during after hours [place], for this patient?  1. Check the care team in [facility] and look for a) attending/consulting TRH provider listed and b) the [facility] team listed 2. Log into [email or web address] and use [place]'s universal password to  access. If you do not have the password, please contact the hospital operator. 3. Locate the [facility] provider you are looking for under [facility] and page to a number that you can be directly reached. 4. If you still have difficulty reaching the provider, please page the [facility] (Director on Call) for the Hospitalists listed on amion for assistance.

## 2018-05-13 NOTE — Plan of Care (Signed)
  Problem: Clinical Measurements: Goal: Ability to maintain clinical measurements within normal limits will improve Outcome: Progressing   

## 2018-05-13 NOTE — Discharge Instructions (Signed)
Orthopedic discharge instructions: -You may maintain your postoperative bandages until your follow-up appointment in 2 weeks.  If they become saturated place a daily dry dressings to cover the incisions.  You may shower with this bandage in place.  Please do not submerge underwater. -You can put full weight on your right leg as tolerated. -For the prevention of blood clots she will resume your normal once per day aspirin as you are taking prior to this operation. -Apply ice to the right hip for 20 to 30 minutes at a time throughout the day for every hour that you are awake. -Return to see Dr. Stann Mainland in 2 weeks for routine wound check.

## 2018-05-13 NOTE — TOC Progression Note (Signed)
Transition of Care [facility]) - Progression Note    Patient Details  Name: Jenna Chambers MRN: [id number] Date of Birth: Jan 26, 1931  Transition of Care [facility]) CM/SW [place], [place] Phone Number: 05/13/2018, 3:21 PM  Clinical Narrative:    Continue to await insurance determination for CIR. Pt niece called CSW phone and requested f/u referral be sent to [place]. CSW has spoke with Loree Fee, admissions at [facility] and she will review referral.    Expected Discharge Plan: [place] Barriers to Discharge: Continued Medical Work up  Expected Discharge Plan and Services Expected Discharge Plan: [place] In-house Referral: Clinical Social Work   Post Acute Care Choice: Durable Medical Equipment, Home Health Living arrangements for the past 2 months: Single Family Home                                       Social Determinants of Health (SDOH) Interventions    Readmission Risk Interventions No flowsheet data found.

## 2018-05-14 ENCOUNTER — Other Ambulatory Visit: Payer: Self-pay

## 2018-05-14 ENCOUNTER — Inpatient Hospital Stay (HOSPITAL_COMMUNITY)
Admission: RE | Admit: 2018-05-14 | Discharge: 2018-05-30 | DRG: 560 | Disposition: A | Discharge status: Home Health | Payer: Medicare Other | Source: Intra-hospital | Attending: Physical Medicine & Rehabilitation | Admitting: Physical Medicine & Rehabilitation

## 2018-05-14 DIAGNOSIS — R52 Pain, unspecified: Secondary | ICD-10-CM

## 2018-05-14 DIAGNOSIS — I10 Essential (primary) hypertension: Secondary | ICD-10-CM

## 2018-05-14 DIAGNOSIS — K5190 Ulcerative colitis, unspecified, without complications: Secondary | ICD-10-CM | POA: Diagnosis not present

## 2018-05-14 DIAGNOSIS — R0989 Other specified symptoms and signs involving the circulatory and respiratory systems: Secondary | ICD-10-CM

## 2018-05-14 DIAGNOSIS — D62 Acute posthemorrhagic anemia: Secondary | ICD-10-CM | POA: Diagnosis present

## 2018-05-14 DIAGNOSIS — K529 Noninfective gastroenteritis and colitis, unspecified: Secondary | ICD-10-CM | POA: Diagnosis present

## 2018-05-14 DIAGNOSIS — W010XXD Fall on same level from slipping, tripping and stumbling without subsequent striking against object, subsequent encounter: Secondary | ICD-10-CM

## 2018-05-14 DIAGNOSIS — Z91040 Latex allergy status: Secondary | ICD-10-CM | POA: Diagnosis not present

## 2018-05-14 DIAGNOSIS — H409 Unspecified glaucoma: Secondary | ICD-10-CM | POA: Diagnosis not present

## 2018-05-14 DIAGNOSIS — E039 Hypothyroidism, unspecified: Secondary | ICD-10-CM | POA: Diagnosis present

## 2018-05-14 DIAGNOSIS — Z8262 Family history of osteoporosis: Secondary | ICD-10-CM

## 2018-05-14 DIAGNOSIS — E1169 Type 2 diabetes mellitus with other specified complication: Secondary | ICD-10-CM | POA: Diagnosis not present

## 2018-05-14 DIAGNOSIS — E119 Type 2 diabetes mellitus without complications: Secondary | ICD-10-CM | POA: Diagnosis not present

## 2018-05-14 DIAGNOSIS — Z880 Allergy status to penicillin: Secondary | ICD-10-CM

## 2018-05-14 DIAGNOSIS — G8918 Other acute postprocedural pain: Secondary | ICD-10-CM

## 2018-05-14 DIAGNOSIS — Z800 Family history of malignant neoplasm of digestive organs: Secondary | ICD-10-CM | POA: Diagnosis not present

## 2018-05-14 DIAGNOSIS — S72001A Fracture of unspecified part of neck of right femur, initial encounter for closed fracture: Secondary | ICD-10-CM | POA: Diagnosis not present

## 2018-05-14 DIAGNOSIS — K5901 Slow transit constipation: Secondary | ICD-10-CM | POA: Diagnosis present

## 2018-05-14 DIAGNOSIS — Z85038 Personal history of other malignant neoplasm of large intestine: Secondary | ICD-10-CM

## 2018-05-14 DIAGNOSIS — Z8249 Family history of ischemic heart disease and other diseases of the circulatory system: Secondary | ICD-10-CM

## 2018-05-14 DIAGNOSIS — M1711 Unilateral primary osteoarthritis, right knee: Secondary | ICD-10-CM | POA: Diagnosis not present

## 2018-05-14 DIAGNOSIS — S72141D Displaced intertrochanteric fracture of right femur, subsequent encounter for closed fracture with routine healing: Principal | ICD-10-CM

## 2018-05-14 DIAGNOSIS — Z96652 Presence of left artificial knee joint: Secondary | ICD-10-CM | POA: Diagnosis present

## 2018-05-14 DIAGNOSIS — M7989 Other specified soft tissue disorders: Secondary | ICD-10-CM | POA: Diagnosis not present

## 2018-05-14 DIAGNOSIS — M8580 Other specified disorders of bone density and structure, unspecified site: Secondary | ICD-10-CM | POA: Diagnosis not present

## 2018-05-14 DIAGNOSIS — K51919 Ulcerative colitis, unspecified with unspecified complications: Secondary | ICD-10-CM

## 2018-05-14 LAB — GLUCOSE, CAPILLARY: Glucose-Capillary: 123 mg/dL — ABNORMAL HIGH (ref 70–99)

## 2018-05-14 MED ORDER — POLYETHYLENE GLYCOL 3350 17 G PO PACK: 17.0000 g | PACK | Freq: Two times a day (BID) | ORAL | Status: AC

## 2018-05-14 MED ORDER — LEVOTHYROXINE SODIUM 75 MCG PO TABS: 150.0000 ug | ORAL_TABLET | Freq: Every day | ORAL | Status: DC

## 2018-05-14 MED ORDER — ONDANSETRON HCL 4 MG/2ML IJ SOLN: 4.0000 mg | Freq: Four times a day (QID) | INTRAMUSCULAR | Status: DC | PRN

## 2018-05-14 MED ORDER — BISACODYL 5 MG PO TBEC: 5.0000 mg | DELAYED_RELEASE_TABLET | Freq: Every day | ORAL | Status: DC | PRN

## 2018-05-14 MED ORDER — ACETAMINOPHEN 325 MG PO TABS
650.0000 mg | ORAL_TABLET | Freq: Four times a day (QID) | ORAL | Status: DC | PRN
  Administered 2018-05-14 – 2018-05-16 (×2): 650 mg via ORAL
  Filled 2018-05-14 (×2): qty 2

## 2018-05-14 MED ORDER — ADULT MULTIVITAMIN W/MINERALS CH
1.0000 | ORAL_TABLET | Freq: Every day | ORAL | Status: DC
  Administered 2018-05-15 – 2018-05-30 (×16): 1 via ORAL
  Filled 2018-05-14 (×16): qty 1

## 2018-05-14 MED ORDER — METHOCARBAMOL 500 MG PO TABS
500.0000 mg | ORAL_TABLET | Freq: Four times a day (QID) | ORAL | Status: DC | PRN
  Administered 2018-05-15 (×3): 500 mg via ORAL
  Filled 2018-05-14 (×4): qty 1

## 2018-05-14 MED ORDER — SORBITOL 70 % SOLN: 30.0000 mL | Freq: Every day | Status: DC | PRN

## 2018-05-14 MED ORDER — SULFASALAZINE 500 MG PO TABS
1000.0000 mg | ORAL_TABLET | Freq: Two times a day (BID) | ORAL | Status: DC
  Administered 2018-05-14 – 2018-05-22 (×16): 1000 mg via ORAL
  Filled 2018-05-14 (×18): qty 2

## 2018-05-14 MED ORDER — DOCUSATE SODIUM 100 MG PO CAPS
100.0000 mg | ORAL_CAPSULE | Freq: Two times a day (BID) | ORAL | Status: DC
  Administered 2018-05-14 – 2018-05-30 (×32): 100 mg via ORAL
  Filled 2018-05-14 (×32): qty 1

## 2018-05-14 MED ORDER — FOLIC ACID 1 MG PO TABS
1.0000 mg | ORAL_TABLET | Freq: Every day | ORAL | Status: DC
  Administered 2018-05-15 – 2018-05-30 (×16): 1 mg via ORAL
  Filled 2018-05-14 (×16): qty 1

## 2018-05-14 MED ORDER — POLYETHYLENE GLYCOL 3350 17 G PO PACK
17.0000 g | PACK | Freq: Two times a day (BID) | ORAL | Status: DC
  Administered 2018-05-14 – 2018-05-28 (×14): 17 g via ORAL
  Filled 2018-05-14 (×28): qty 1

## 2018-05-14 MED ORDER — METHOCARBAMOL 1000 MG/10ML IJ SOLN
500.0000 mg | Freq: Four times a day (QID) | INTRAVENOUS | Status: DC | PRN
  Filled 2018-05-14: qty 5

## 2018-05-14 MED ORDER — BRINZOLAMIDE 1 % OP SUSP
1.0000 [drp] | Freq: Two times a day (BID) | OPHTHALMIC | Status: DC
  Administered 2018-05-14 – 2018-05-30 (×32): 1 [drp] via OPHTHALMIC
  Filled 2018-05-14: qty 10

## 2018-05-14 MED ORDER — ASPIRIN EC 81 MG PO TBEC
81.0000 mg | DELAYED_RELEASE_TABLET | Freq: Every day | ORAL | Status: DC
  Administered 2018-05-15 – 2018-05-30 (×16): 81 mg via ORAL
  Filled 2018-05-14 (×16): qty 1

## 2018-05-14 MED ORDER — LATANOPROST 0.005 % OP SOLN
1.0000 [drp] | Freq: Every day | OPHTHALMIC | Status: DC
  Administered 2018-05-14 – 2018-05-29 (×16): 1 [drp] via OPHTHALMIC
  Filled 2018-05-14: qty 2.5

## 2018-05-14 MED ORDER — HYDROCODONE-ACETAMINOPHEN 5-325 MG PO TABS
1.0000 | ORAL_TABLET | Freq: Four times a day (QID) | ORAL | Status: DC | PRN
  Administered 2018-05-15: 2 via ORAL
  Administered 2018-05-15 – 2018-05-19 (×8): 1 via ORAL
  Administered 2018-05-19: 2 via ORAL
  Filled 2018-05-14 (×2): qty 1
  Filled 2018-05-14 (×2): qty 2
  Filled 2018-05-14 (×7): qty 1

## 2018-05-14 MED ORDER — LOSARTAN POTASSIUM 50 MG PO TABS
100.0000 mg | ORAL_TABLET | Freq: Every day | ORAL | Status: DC
  Administered 2018-05-15: 100 mg via ORAL
  Filled 2018-05-14 (×2): qty 2

## 2018-05-14 MED ORDER — AMLODIPINE BESYLATE 10 MG PO TABS
10.0000 mg | ORAL_TABLET | Freq: Every day | ORAL | Status: DC
  Administered 2018-05-15 – 2018-05-30 (×16): 10 mg via ORAL
  Filled 2018-05-14 (×16): qty 1

## 2018-05-14 MED ORDER — ONDANSETRON HCL 4 MG PO TABS: 4.0000 mg | ORAL_TABLET | Freq: Four times a day (QID) | ORAL | Status: DC | PRN

## 2018-05-14 MED ORDER — HYDROCODONE-ACETAMINOPHEN 5-325 MG PO TABS: 1.0000 | ORAL_TABLET | Freq: Four times a day (QID) | ORAL | Status: DC | PRN

## 2018-05-14 MED ORDER — BRIMONIDINE TARTRATE 0.15 % OP SOLN
1.0000 [drp] | Freq: Two times a day (BID) | OPHTHALMIC | Status: DC
  Administered 2018-05-14 – 2018-05-30 (×32): 1 [drp] via OPHTHALMIC
  Filled 2018-05-14: qty 5

## 2018-05-14 NOTE — Discharge Summary (Addendum)
Physician Discharge Summary  Jenna Chambers XIP:[id number] DOB: 1931-07-14 DOA: 05/10/2018  PCP: Jenna Frees, MD  Admit date: 05/10/2018 Discharge date: 05/14/2018  Recommendations for Outpatient Follow-up:   Right hip fracture s/p mechanical fall --s/p operative fixation 4/25 --per DR Stann Mainland:  WBAT  Return 2 weeks staple removal Daily ASA   Follow-up Information    [name], MD In 2 weeks.   Specialty:  Orthopedic Surgery Why:  For wound re-check, For suture removal Contact information: [address] [place] [address] [phone]            Discharge Diagnoses: Principal diagnosis is #1 1. Right hip fracture s/p mechanical fall 2. ABLA  3. DM type 2, diet-controlled 4. Glaucoma OA 5. Hypothyroidism 6. Essential HTN 7. Ulcerative colitis  8. Morbid obesity  Discharge Condition: improved Disposition: CIR  Diet recommendation: diabetic diet  Filed Weights   05/10/18 1254  Weight: 103 kg    History of present illness:  87yow presented with right hip pain s/p mechanical fall. Admitted for right hip fx.  Hospital Course:  Patient underwent operative fixation and had uncomplicated post-operative course. Evaluated by therapy with rec for CIR. Accepted by CIR 4/29. Individual issues as below.  Right hip fracture s/p mechanical fall --s/p operative fixation 4/25 --per DR Stann Mainland:  WBAT  Return 2 weeks staple removal Daily ASA --CIR per therapy  ABLA  --asymptomatic --Hgb trending back up  DM type 2, diet-controlled --CBG remains stable  Glaucoma OA --continue brimonidine, brinzolamide, latanoprost  Hypothyroidism --continue lebothyroxine  Essential HTN --continue amlodipline, losartan  Ulcerative colitis  --continue sulfasalazine  Morbid obesity --Body mass index is 40.21 kg/m.  --per dietician  Consultants   Orthopedics   Procedures   PROCEDURE: Treatment of intertrochanteric,  pertrochanteric, subtrochanteric fracture with intramedullary implant. CPT [phone]   Today's assessment: S: feels ok; right leg pain with movement. Poor appetite. O: Vitals:  Vitals:   05/13/18 1955 05/14/18 0341  BP: (!) 138/48 140/61  Pulse: 65 68  Resp: 15 17  Temp: 98.2 F (36.8 C)   SpO2: 100% 95%    Constitutional:  . Appears calm and comfortable Respiratory:  . CTA bilaterally, no w/r/r.  . Respiratory effort normal.  Cardiovascular:  . RRR, no m/r/g . No LE extremity edema   Musculoskeletal:  . RLE, LLE   tone normal Right leg weak from pain but able to move foot and knee to command Neurologic:  . Sensation right foot grossly intact Psychiatric:  . Mental status o Mood, affect appropriate  BMP noted, K+4.0, creatinine .96  Hgb stable 107. Plts increased, 165  Discharge Instructions   Allergies as of 05/14/2018      Reactions   Latex Rash   Penicillins Rash      Medication List    TAKE these medications   acetaminophen 500 MG tablet Commonly known as:  TYLENOL Take 1,000 mg by mouth every 6 (six) hours as needed.   Alphagan P 0.1 % Soln Generic drug:  brimonidine Place 1 drop into both eyes 2 (two) times daily.   amLODipine 10 MG tablet Commonly known as:  NORVASC Take 10 mg by mouth daily.   aspirin 81 MG tablet Take 81 mg by mouth daily.   Azopt 1 % ophthalmic suspension Generic drug:  brinzolamide Place 1 drop into both eyes 2 (two) times a day.   diclofenac sodium 1 % Gel Commonly known as:  VOLTAREN Apply topically as needed.   folic acid  1 MG tablet Commonly known as:  FOLVITE Take 1 mg by mouth daily.   HYDROcodone-acetaminophen 5-325 MG tablet Commonly known as:  NORCO/VICODIN Take 1-2 tablets by mouth every 6 (six) hours as needed for moderate pain.   levothyroxine 150 MCG tablet Commonly known as:  SYNTHROID Take 150 mcg by mouth daily before breakfast.   losartan 100 MG tablet Commonly known as:  COZAAR Take 100 mg  by mouth daily.   meloxicam 15 MG tablet Commonly known as:  MOBIC Take 15 mg by mouth as needed for pain.   polyethylene glycol 17 g packet Commonly known as:  MIRALAX / GLYCOLAX Take 17 g by mouth 2 (two) times daily.   sulfaSALAzine 500 MG tablet Commonly known as:  AZULFIDINE Take 1,000 mg by mouth 2 (two) times daily.   Vitamin D 50 MCG (2000 UT) Caps Take by mouth.   Zioptan 0.0015 % Soln Generic drug:  Tafluprost (PF) Place 1 drop into both eyes daily.   Zioptan 0.0015 % Soln Generic drug:  Tafluprost (PF) Place 1 drop into both eyes at bedtime.      Allergies  Allergen Reactions  . Latex Rash  . Penicillins Rash    The results of significant diagnostics from this hospitalization (including imaging, microbiology, ancillary and laboratory) are listed below for reference.    Significant Diagnostic Studies: Dg Pelvis Portable  Result Date: 05/10/2018 CLINICAL DATA:  Fall today.  Hip pain. EXAM: PORTABLE PELVIS 1-2 VIEWS COMPARISON:  Pelvic CT 09/28/2017. FINDINGS: The bones appear adequately mineralized. There is an acute intertrochanteric right femur fracture which is mildly comminuted and moderately angulated. The femoral head is located. No evidence pelvic fracture. Mild lower lumbar spondylosis noted. IMPRESSION: Comminuted and angulated intertrochanteric right femur fracture. Electronically Signed   By: Richardean Sale M.D.   On: 05/10/2018 13:56   Dg Chest Portable 1 View  Result Date: 05/10/2018 CLINICAL DATA:  Fall today.  Right hip fracture. EXAM: PORTABLE CHEST 1 VIEW COMPARISON:  Radiographs 09/16/2008. FINDINGS: 1302 hours. Mild right hemidiaphragm elevation with resulting minimal right basilar atelectasis. The lungs are otherwise clear. There is no pleural effusion or pneumothorax. The heart size and mediastinal contours are stable. No acute osseous findings are seen. IMPRESSION: No evidence of acute chest injury or active cardiopulmonary process. Minimal  right basilar atelectasis related to right hemidiaphragm elevation. Electronically Signed   By: Richardean Sale M.D.   On: 05/10/2018 13:59   Dg Knee Right Port  Result Date: 05/10/2018 CLINICAL DATA:  Fall EXAM: PORTABLE RIGHT KNEE - 1-2 VIEW COMPARISON:  None. FINDINGS: Partially visualized intramedullary rod within the mid femur. No acute displaced fracture or malalignment. Moderate patellofemoral degenerative change. Advanced degenerative change medial compartment of the knee. Mild degenerative change of the lateral compartment. Small knee effusion. IMPRESSION: 1. No acute osseous abnormality. 2. Tricompartment arthritis of the knee with small knee effusion Electronically Signed   By: Donavan Foil M.D.   On: 05/10/2018 18:35   Dg C-arm 1-60 Min  Result Date: 05/10/2018 CLINICAL DATA:  Right hip fracture EXAM: DG C-ARM 61-120 MIN; RIGHT FEMUR 2 VIEWS COMPARISON:  05/10/2018 FINDINGS: Five low resolution intraoperative spot views of the right hip. Total fluoroscopy time was 1 minutes 9 seconds. The images demonstrate intramedullary rod and screw fixation of right intertrochanteric fracture. IMPRESSION: Intraoperative fluoroscopic assistance provided during surgical fixation of right hip fracture Electronically Signed   By: Donavan Foil M.D.   On: 05/10/2018 18:32   Dg Femur, Min 2  Views Right  Result Date: 05/10/2018 CLINICAL DATA:  Right hip fracture EXAM: DG C-ARM 61-120 MIN; RIGHT FEMUR 2 VIEWS COMPARISON:  05/10/2018 FINDINGS: Five low resolution intraoperative spot views of the right hip. Total fluoroscopy time was 1 minutes 9 seconds. The images demonstrate intramedullary rod and screw fixation of right intertrochanteric fracture. IMPRESSION: Intraoperative fluoroscopic assistance provided during surgical fixation of right hip fracture Electronically Signed   By: Donavan Foil M.D.   On: 05/10/2018 18:32   Dg Femur [place], [place] 2 Views Right  Result Date: 05/10/2018 CLINICAL DATA:  Fall today.   Right hip pain. EXAM: RIGHT FEMUR PORTABLE 2 VIEW COMPARISON:  None. FINDINGS: The bones appear adequately mineralized. There is an acute comminuted intertrochanteric right femur fracture which demonstrates moderate apex lateral angulation. The right femoral head is located. There are mild underlying right hip degenerative changes. There are moderate degenerative changes at the right knee. No evidence of acute injury in the distal femur. IMPRESSION: Comminuted and angulated intertrochanteric right femur fracture. Electronically Signed   By: Richardean Sale M.D.   On: 05/10/2018 13:58    Microbiology: Recent Results (from the past 240 hour(s))  Novel Coronavirus, NAA (hospital order; send-out to ref lab)     Status: None   Collection Time: 05/10/18  3:08 PM  Result Value Ref Range Status   SARS-CoV-2, NAA NOT DETECTED NOT DETECTED Final    Comment: (NOTE) This test was developed and its performance characteristics determined by [employer]. This test has not been FDA cleared or approved. This test has been authorized by FDA under an Emergency Use Authorization (EUA). This test has been validated in accordance with the FDA's Guidance Document (Policy for [place] in Laboratories Certified to Perform High Complexity Testing under CLIA prior to Emergency Use Authorization for Coronavirus QASTMHD-6222 during the [facility] Emergency) issued on February 29th, 2020. FDA independent review of this validation is pending. This test is only authorized for the duration of time the declaration that circumstances exist justifying the authorization of the emergency use of in vitro diagnostic tests for detection of SARS-CoV- 2 virus and/or diagnosis of COVID-19 infection under section 564(b)(1) of the Act, 21 U.S.C. 979GXQ-1(J)(9), unless the authorization is terminated or revoked sooner. Performed At: [facility] [address] [place], [place] [id number] Rush Farmer  MD YJ:[id number]    [place]  Final    Comment: Performed at [facility], [place] [address] [place], [place] [address]     Labs: Basic Metabolic Panel: Recent Labs  Lab 05/10/18 1255 05/12/18 0224 05/13/18 1240  NA 137 135 134*  K 3.9 4.3 4.0  CL 103 101 97*  CO2 22 25 25   GLUCOSE 168* 132* 198*  BUN 13 19 16   CREATININE 0.95 1.12* 0.96  CALCIUM 9.1 8.6* 9.3   Liver Function Tests: Recent Labs  Lab 05/10/18 1255  AST 16  ALT 11  ALKPHOS 93  BILITOT 0.7  PROT 7.1  ALBUMIN 3.8   CBC: Recent Labs  Lab 05/10/18 1255 05/12/18 0224 05/13/18 1240  WBC 5.5 7.9 9.9  NEUTROABS 4.3  --   --   HGB 13.4 9.7* 10.7*  HCT 42.2 30.3* 32.9*  MCV 87.7 88.1 87.3  PLT 159 142* 165    CBG: Recent Labs  Lab 05/10/18 1750 05/10/18 2021 05/11/18 0820 05/11/18 1239 05/11/18 1728  GLUCAP 168* 180* 145* 191* 117*    Principal Problem:   Hip fracture (HCC) Active Problems:   Diabetes (  [place])   Essential hypertension, benign   Glaucoma   Ulcerative colitis ([place])   Acute blood loss anemia   Obesity, Class III, BMI 40-49.9 (morbid obesity) ([place])   Time coordinating discharge: 25 minutes  Signed:  Murray Hodgkins, MD  [facility]  05/14/2018, 10:46 AM

## 2018-05-14 NOTE — Progress Notes (Signed)
Inpatient Rehabilitation Admissions Coordinator  I have insurance approval and bed available to admit pt to inpt rehab today. I  met with patient at bedside and spoke with her niece, [name], by phone. They are in agreement to admit today. I have notified Dr. Sarajane Jews as well as SW, [place]. I will make the arrangements to admit today.  Danne Baxter, RN, MSN Rehab Admissions Coordinator [phone] 05/14/2018 10:16 AM

## 2018-05-14 NOTE — Progress Notes (Signed)
[name], MD  Physician  Physical Medicine and Rehabilitation  PMR Pre-admission  Signed  Date of Service:  05/14/2018 10:50 AM       Related encounter: ED to Hosp-Admission (Discharged) from 05/10/2018 in [facility]      Signed         Show:Clear all _0 Manual_1 Template_2 Copied  Added by: _3 Cristina Gong, RN_4 [name], MD  _5 Hover for details PMR Admission Coordinator Pre-Admission Assessment  Patient: Jenna Chambers is an 85 y.o., female MRN: [id number] DOB: [birth year] Height: _6  (160 cm) Weight: 103 kg  Insurance Information HMO: yes    PPO:      PCP:      IPA:      80/20:      OTHER: Medicare advantage plan PRIMARY: Blue Medicare      Policy#: [id number]      Subscriber: pt CM Name: Manuela Schwartz      Phone#: [phone]     Fax#: [phone] Pre-Cert#: [id number]      Employer:  Benefits:  Phone #: [phone]     Name: 4/28 Eff. Date: 01/15/2018     Deduct: none      Out of Pocket Max: $3500      Life Max: none CIR: $250 co pay per day days 1 until 6      SNF: no co pay days 1 until 20; $40 co pay per day days 21 until 60; no co pay days 61 until 100 Outpatient: 90%     Co-Pay: visits per medical neccesity Home Health: 100%      Co-Pay: visits per medical neccesity DME: 80%     Co-Pay: 20% Providers: in network  SECONDARY: none      Medicaid Application Date:       Case Manager:  Disability Application Date:       Case Worker:   The "Data Collection Information Summary" for patients in Inpatient Rehabilitation Facilities with attached "Privacy Act [place] Records" was provided and verbally reviewed with: Patient and Family  Emergency Contact Information         Contact Information    Name Relation Home Work Mobile   Metcalf,Linda Niece [phone]  [phone]      Current Medical History  Patient Admitting Diagnosis: right intertrochanteric fracture  History of Present  Illness:Jenna Chambers is an 85 year old right-handed female with history of colon cancer with resection, diet controlled diabetes mellitus,,hypertension, ulcerated colitis. Presented on 05/10/2018 after reported fall when she tried to step on a bug and fell backwards. No loss of consciousness. Sustained comminuted and angulated intertrochanteric right femur fracture.underwent IM nailing 05/10/2018 per Dr. Victorino December. Weightbearing as tolerated. Hospital course pain management. Acute blood loss anemia 9.7-10.7 and monitored. Maintained on aspirin for DVT prophylaxis.  Patient's medical record from [facility] has been reviewed by the rehabilitation admission coordinator and physician.  Past Medical History      Past Medical History:  Diagnosis Date  . Colon cancer (Jenna Chambers)    resecetion  . Diabetes ([place])    Type 2  . Glaucoma    --both eyes per patient getting worse  . Gout   . Hypertension   . Osteopenia   . Sleep apnea    no CPAP  . Ulcerative colitis ([place])     Family History   family history includes Cancer (age of onset: 85) in her brother; Heart disease in her father; Hypertension in her brother, father, and sister;  Osteoarthritis in her mother; Osteoporosis in her mother.  Prior Rehab/Hospitalizations Has the patient had prior rehab or hospitalizations prior to admission? Yes   SNF at [facility] 10 years ago after knee surgery  Has the patient had major surgery during 100 days prior to admission? Yes             Current Medications  Current Facility-Administered Medications:  .  acetaminophen (TYLENOL) tablet 650 mg, 650 mg, Oral, Q6H PRN, Samuella Cota, MD, 650 mg at 05/13/18 2208 .  amLODipine (NORVASC) tablet 10 mg, 10 mg, Oral, Daily, [name], MD, 10 mg at 05/13/18 0932 .  aspirin EC tablet 81 mg, 81 mg, Oral, Daily, [name], MD, 81 mg at 05/13/18 0931 .  bisacodyl (DULCOLAX) EC tablet 5 mg, 5 mg, Oral, Daily PRN,  [name], MD .  brimonidine (ALPHAGAN) 0.15 % ophthalmic solution 1 drop, 1 drop, Both Eyes, BID, Stann Mainland Elly Modena, MD, 1 drop at 05/13/18 2209 .  brinzolamide (AZOPT) 1 % ophthalmic suspension 1 drop, 1 drop, Both Eyes, BID, Stann Mainland Elly Modena, MD, 1 drop at 05/13/18 2039 .  docusate sodium (COLACE) capsule 100 mg, 100 mg, Oral, BID, [name], MD, 100 mg at 05/13/18 2208 .  folic acid (FOLVITE) tablet 1 mg, 1 mg, Oral, Daily, [name], MD, 1 mg at 05/13/18 0931 .  HYDROcodone-acetaminophen (NORCO/VICODIN) 5-325 MG per tablet 1-2 tablet, 1-2 tablet, Oral, Q6H PRN, [name], MD, 1 tablet at 05/13/18 1219 .  latanoprost (XALATAN) 0.005 % ophthalmic solution 1 drop, 1 drop, Both Eyes, QHS, Green, Terri L, RPH, 1 drop at 05/13/18 2209 .  levothyroxine (SYNTHROID) tablet 150 mcg, 150 mcg, Oral, QAC breakfast, [name], MD, 150 mcg at 05/14/18 [phone] .  losartan (COZAAR) tablet 100 mg, 100 mg, Oral, Daily, [name], MD, 100 mg at 05/13/18 0933 .  methocarbamol (ROBAXIN) tablet 500 mg, 500 mg, Oral, Q6H PRN, 500 mg at 05/12/18 1332 **OR** methocarbamol (ROBAXIN) 500 mg in dextrose 5 % 50 mL IVPB, 500 mg, Intravenous, Q6H PRN, [name], MD .  morphine 2 MG/ML injection 0.5 mg, 0.5 mg, Intravenous, Q2H PRN, [name], MD, 0.5 mg at 05/11/18 0227 .  multivitamin with minerals tablet 1 tablet, 1 tablet, Oral, Daily, Hosie Poisson, MD, 1 tablet at 05/13/18 0931 .  ondansetron (ZOFRAN) tablet 4 mg, 4 mg, Oral, Q6H PRN **OR** ondansetron (ZOFRAN) injection 4 mg, 4 mg, Intravenous, Q6H PRN, [name], MD .  polyethylene glycol (MIRALAX / GLYCOLAX) packet 17 g, 17 g, Oral, BID, Hosie Poisson, MD, 17 g at 05/13/18 2207 .  sulfaSALAzine (AZULFIDINE) tablet 1,000 mg, 1,000 mg, Oral, BID, [name], MD, 1,000 mg at 05/13/18 2208  Patients Current Diet:     Diet Order                    Diet Carb Modified Fluid consistency: Thin; Room service appropriate? Yes  Diet effective now               Precautions / Restrictions Precautions Precautions: Fall Restrictions Weight Bearing Restrictions: Yes RLE Weight Bearing: Weight bearing as tolerated   Has the patient had 2 or more falls or a fall with injury in the past year? Yes  Prior Activity Level Limited Community (1-2x/wk): Mod I with cane in community; only uses cane outside and RW if knees bothering her; drove  Prior Functional Level Self Care: Did  the patient need help bathing, dressing, using the toilet or eating? Independent  Indoor Mobility: Did the patient need assistance with walking from room to room (with or without device)? Independent  Stairs: Did the patient need assistance with internal or external stairs (with or without device)? Independent  Functional Cognition: Did the patient need help planning regular tasks such as shopping or remembering to take medications? Independent  Home Assistive Devices / Equipment Home Assistive Devices/Equipment: None Home Equipment: Environmental consultant - 2 wheels, Cane - single point, Bedside commode, Toilet riser  Prior Device Use: Indicate devices/aids used by the patient prior to current illness, exacerbation or injury? cane in community; RW if knees bothering her  Current Functional Level Cognition  Overall Cognitive Status: Within Functional Limits for tasks assessed Orientation Level: Oriented X4 General Comments: Pt anxious about mobility, tachypnea with exertion.     Extremity Assessment (includes Sensation/Coordination)  Upper Extremity Assessment: Defer to OT evaluation, Overall WFL for tasks assessed  Lower Extremity Assessment: RLE deficits/detail RLE Deficits / Details: post-op deficits as anticipated with IM nail; grossly decr AROM and strength, limited by pain RLE: Unable to fully assess due to pain RLE Coordination: decreased gross  motor    ADLs  Overall ADL's : Needs assistance/impaired Eating/Feeding: Modified independent, Sitting Grooming: Set up, Sitting Upper Body Bathing: Minimal assistance, Sitting Lower Body Bathing: Maximal assistance, Sitting/lateral leans Upper Body Dressing : Set up, Sitting Lower Body Dressing: Maximal assistance, +2 for physical assistance, +2 for safety/equipment, Sit to/from stand Lower Body Dressing Details (indicate cue type and reason): unable to access LB for dressing at this time Toilet Transfer: +2 for physical assistance, Moderate assistance, RW Toilet Transfer Details (indicate cue type and reason): simulated with transfer from recliner to chair Toileting- Clothing Manipulation and Hygiene: Maximal assistance Functional mobility during ADLs: Moderate assistance, +2 for physical assistance, +2 for safety/equipment, Rolling walker, Cueing for sequencing General ADL Comments: Pt declining ADLs, stating she has already bathed and brushed teeth. Pt up in chair upon therapist arrival and would like to return to bed, states she has been up approximately 2 hours.     Mobility  Overal bed mobility: Needs Assistance Bed Mobility: Sit to Supine Supine to sit: Mod assist, +2 for physical assistance, +2 for safety/equipment, HOB elevated Sit to supine: HOB elevated, Mod assist General bed mobility comments: Pt up in chair upon PT arrival to room. Mod assist for LE lifting and translation into bed for sit to supine. Verbal cuing for lowering onto R elbow and assisting in lifting LEs into bed.     Transfers  Overall transfer level: Needs assistance Equipment used: Rolling walker (2 wheeled) Transfers: Sit to/from Stand Sit to Stand: Mod assist Stand pivot transfers: +2 physical assistance, Min assist General transfer comment: Mod assist for power up, steadying upon standing, stabilizing pt's RLE as pt with tendency for mild buckling. PT worked on [place] prior to  initiating ambulation with pt, and with stepping forward and back with RLE. Pt breathing quickly and reported pain, improved with increased time standing.     Ambulation / Gait / Stairs / Wheelchair Mobility  Ambulation/Gait Ambulation/Gait assistance: Herbalist (Feet): 3 Feet Assistive device: Rolling walker (2 wheeled) Gait Pattern/deviations: Step-to pattern, Decreased step length - right, Antalgic, Trunk flexed General Gait Details: Min assist for steadying, RLE guarding as pt with tendency to mildly buckle on RLE. PT instructed pt to step R foot forward, push through UEs, then step L foot  forward. Pt requesting going back to bed after 3 ft ambulation due to pain and fatigue.     Posture / Balance Balance Overall balance assessment: Needs assistance Sitting-balance support: Feet supported, No upper extremity supported Sitting balance-Leahy Scale: Good Standing balance support: Bilateral upper extremity supported Standing balance-Leahy Scale: Poor Standing balance comment: dependent on BUE support in standing. Pt instructed in pre-gait activities of weight shifting L and R, stepping forward and back with RLE. Pt stood ~1 minute prior to initiating ambulation.     Special needs/care consideration BiPAP/CPAP  N/a CPM  N/a Continuous Drip IV  N/a Dialysis n/a Life Vest  N/a Oxygen  N/a Special Bed  N/a Trach Size  N/a Wound Vac n/a Skin surgical incision Bowel mgmt:  LBM 4/24 continent Bladder mgmt: incontinent; external catheter Diabetic mgmt:  yes Behavioral consideration  N/a Chemo/radiation  N/a   Previous Home Environment  Living Arrangements: Alone  Lives With: Alone Available Help at Discharge: Family, Available 24 hours/day(niece, [name], will hire asisst as recommended/needed) Type of Home: House Home Layout: One level Home Access: Stairs to enter Entrance Stairs-Rails: Right, Left, Can reach both Entrance Stairs-Number of Steps: 4 Bathroom  Shower/Tub: Multimedia programmer: Standard Bathroom Accessibility: Yes How Accessible: Accessible via walker [place]: No  Discharge Living Setting Plans for Discharge Living Setting: Patient's home, Alone Type of Home at Discharge: House Discharge Home Layout: One level Discharge Home Access: Stairs to enter Entrance Stairs-Rails: Right, Left, Can reach both Entrance Stairs-Number of Steps: 4 Discharge Bathroom Shower/Tub: Walk-in shower Discharge Bathroom Toilet: Standard Discharge Bathroom Accessibility: Yes How Accessible: Accessible via walker Does the patient have any problems obtaining your medications?: No  Social/Family/Support Systems Patient Roles: (widowed for 20 years; niece very helpful. 2 stepchildren) Contact Information: Benson [place], niece Anticipated Caregiver: niece and hired assist  Anticipated Caregiver's Contact Information: [phone] Ability/Limitations of Caregiver: niece can't herself do 24/7 sueprvision but can arrnage hired assist Caregiver Availability: Other (Comment) Discharge Plan Discussed with Primary Caregiver: Yes Is Caregiver In Agreement with Plan?: Yes Does Caregiver/Family have Issues with Lodging/Transportation while Pt is in Rehab?: No  Goals/Additional Needs Patient/Family Goal for Rehab: Mod I to supervision PT, supervision to min assist OT Expected length of stay: ELOS 10 to 14 days Pt/Family Agrees to Admission and willing to participate: Yes Program Orientation Provided & Reviewed with Pt/Caregiver Including Roles  & Responsibilities: Yes  Decrease burden of Care through IP rehab admission: n/a  Possible need for SNF placement upon discharge:  Not anticipated  Patient Condition: I have reviewed medical records from [facility], spoken with CSW, and patient and family member. I met with patient at the bedside for inpatient rehabilitation assessment.  Patient will benefit from ongoing PT and OT,  can actively participate in 3 hours of therapy a day 5 days of the week, and can make measurable gains during the admission.  Patient will also benefit from the coordinated team approach during an Inpatient Acute Rehabilitation admission.  The patient will receive intensive therapy as well as Rehabilitation physician, nursing, social worker, and care management interventions.  Due to bladder management, bowel management, safety, skin/wound care, disease management, medication administration, pain management and patient education the patient requires 24 hour a day rehabilitation nursing.  The patient is currently mod assist with mobility and basic ADLs.  Discharge setting and therapy post discharge at home with home health is anticipated.  Patient has agreed to participate in the Acute Inpatient Rehabilitation Program and  will admit today.  Preadmission Screen Completed By:  Cleatrice Burke RN MSN 05/14/2018 10:50 AM ______________________________________________________________________   Discussed status with Dr. Posey Pronto  on  05/14/2018 at  53 and received approval for admission today.  Admission Coordinator:  Cleatrice Burke, RN MSN time 1100 Date  05/14/2018   Assessment/Plan: Diagnosis: right intertrochanteric fracture  1. Does the need for close, 24 hr/day Medical supervision in concert with the patient's rehab needs make it unreasonable for this patient to be served in a less intensive setting? Yes  2. Co-Morbidities requiring supervision/potential complications: colon cancer with resection, diet controlled diabetes mellitus, hypertension, ulcerated colitis 3. Due to bowel management, safety, skin/wound care, disease management, pain management and patient education, does the patient require 24 hr/day rehab nursing? Yes 4. Does the patient require coordinated care of a physician, rehab nurse, PT (1-2 hrs/day, 5 days/week) and OT (1-2 hrs/day, 5 days/week) to address physical and  functional deficits in the context of the above medical diagnosis(es)? Yes Addressing deficits in the following areas: balance, endurance, locomotion, strength, transferring, bathing, dressing, toileting and psychosocial support 5. Can the patient actively participate in an intensive therapy program of at least 3 hrs of therapy 5 days a week? Yes 6. The potential for patient to make measurable gains while on inpatient rehab is excellent 7. Anticipated functional outcomes upon discharge from inpatients are: supervision PT, supervision OT, n/a SLP 8. Estimated rehab length of stay to reach the above functional goals is: 10-14 days. 9. Anticipated D/C setting: Home 10. Anticipated post D/C treatments: HH therapy and Home excercise program 11. Overall Rehab/Functional Prognosis: good  MD Signature: Delice Lesch, MD, ABPMR        Revision History

## 2018-05-14 NOTE — PMR Pre-admission (Signed)
PMR Admission Coordinator Pre-Admission Assessment  Patient: Jenna Chambers is an 85 y.o., female MRN: [id number] DOB: 11-09-1931 Height: 5' 3"  (160 cm) Weight: 103 kg  Insurance Information HMO: yes    PPO:      PCP:      IPA:      80/20:      OTHER: Medicare advantage plan PRIMARY: Blue Medicare      Policy#: [id number]      Subscriber: pt CM Name: Manuela Schwartz      Phone#: [phone]     Fax#: [phone] Pre-Cert#: [id number]      Employer:  Benefits:  Phone #: [phone]     Name: 4/28 Eff. Date: 01/15/2018     Deduct: none      Out of Pocket Max: $3500      Life Max: none CIR: $250 co pay per day days 1 until 6      SNF: no co pay days 1 until 20; $40 co pay per day days 21 until 60; no co pay days 61 until 100 Outpatient: 90%     Co-Pay: visits per medical neccesity Home Health: 100%      Co-Pay: visits per medical neccesity DME: 80%     Co-Pay: 20% Providers: in network  SECONDARY: none      Medicaid Application Date:       Case Manager:  Disability Application Date:       Case Worker:   The "Data Collection Information Summary" for patients in Inpatient Rehabilitation Facilities with attached "Privacy Act [place] Records" was provided and verbally reviewed with: Patient and Family  Emergency Contact Information Contact Information    Name Relation Home Work Mobile   Metcalf,Linda Niece [phone]  [phone]      Current Medical History  Patient Admitting Diagnosis: right intertrochanteric fracture  History of Present Illness: Jenna Chambers is an 85 year old right-handed female with history of colon cancer with resection, diet controlled diabetes mellitus,,hypertension, ulcerated colitis. Presented on 05/10/2018 after reported fall when she tried to step on a bug and fell backwards. No loss of consciousness. Sustained comminuted and angulated intertrochanteric right femur fracture.underwent IM nailing 05/10/2018 per Dr. Victorino December. Weightbearing as tolerated.  Hospital course pain management. Acute blood loss anemia 9.7-10.7 and monitored. Maintained on aspirin for DVT prophylaxis.  Patient[facility] has been reviewed by the rehabilitation admission coordinator and physician.  Past Medical History  Past Medical History:  Diagnosis Date  . Colon cancer ([place])    resecetion  . Diabetes ([place])    Type 2  . Glaucoma    --both eyes per patient getting worse  . Gout   . Hypertension   . Osteopenia   . Sleep apnea    no CPAP  . Ulcerative colitis ([place])     Family History   family history includes Cancer (age of onset: 42) in her brother; Heart disease in her father; Hypertension in her brother, father, and sister; Osteoarthritis in her mother; Osteoporosis in her mother.  Prior Rehab/Hospitalizations Has the patient had prior rehab or hospitalizations prior to admission? Yes   SNF at [facility] 10 years ago after knee surgery  Has the patient had major surgery during 100 days prior to admission? Yes   Current Medications  Current Facility-Administered Medications:  .  acetaminophen (TYLENOL) tablet 650 mg, 650 mg, Oral, Q6H PRN, Samuella Cota, MD, 650 mg at 05/13/18 2208 .  amLODipine (NORVASC) tablet 10 mg, 10 mg, Oral,  Daily, [name], MD, 10 mg at 05/13/18 0932 .  aspirin EC tablet 81 mg, 81 mg, Oral, Daily, [name], MD, 81 mg at 05/13/18 0931 .  bisacodyl (DULCOLAX) EC tablet 5 mg, 5 mg, Oral, Daily PRN, [name], MD .  brimonidine (ALPHAGAN) 0.15 % ophthalmic solution 1 drop, 1 drop, Both Eyes, BID, Stann Mainland Elly Modena, MD, 1 drop at 05/13/18 2209 .  brinzolamide (AZOPT) 1 % ophthalmic suspension 1 drop, 1 drop, Both Eyes, BID, Stann Mainland Elly Modena, MD, 1 drop at 05/13/18 2039 .  docusate sodium (COLACE) capsule 100 mg, 100 mg, Oral, BID, [name], MD, 100 mg at 05/13/18 2208 .  folic acid (FOLVITE) tablet 1 mg, 1 mg, Oral, Daily, [name], MD, 1 mg at 05/13/18 0931 .  HYDROcodone-acetaminophen (NORCO/VICODIN) 5-325 MG per tablet 1-2 tablet, 1-2 tablet, Oral, Q6H PRN, [name], MD, 1 tablet at 05/13/18 1219 .  latanoprost (XALATAN) 0.005 % ophthalmic solution 1 drop, 1 drop, Both Eyes, QHS, Green, Terri L, RPH, 1 drop at 05/13/18 2209 .  levothyroxine (SYNTHROID) tablet 150 mcg, 150 mcg, Oral, QAC breakfast, [name], MD, 150 mcg at 05/14/18 [phone] .  losartan (COZAAR) tablet 100 mg, 100 mg, Oral, Daily, [name], MD, 100 mg at 05/13/18 0933 .  methocarbamol (ROBAXIN) tablet 500 mg, 500 mg, Oral, Q6H PRN, 500 mg at 05/12/18 1332 **OR** methocarbamol (ROBAXIN) 500 mg in dextrose 5 % 50 mL IVPB, 500 mg, Intravenous, Q6H PRN, [name], MD .  morphine 2 MG/ML injection 0.5 mg, 0.5 mg, Intravenous, Q2H PRN, [name], MD, 0.5 mg at 05/11/18 0227 .  multivitamin with minerals tablet 1 tablet, 1 tablet, Oral, Daily, Hosie Poisson, MD, 1 tablet at 05/13/18 0931 .  ondansetron (ZOFRAN) tablet 4 mg, 4 mg, Oral, Q6H PRN **OR** ondansetron (ZOFRAN) injection 4 mg, 4 mg, Intravenous, Q6H PRN, [name], MD .  polyethylene glycol (MIRALAX / GLYCOLAX) packet 17 g, 17 g, Oral, BID, Hosie Poisson, MD, 17 g at 05/13/18 2207 .  sulfaSALAzine (AZULFIDINE) tablet 1,000 mg, 1,000 mg, Oral, BID, [name], MD, 1,000 mg at 05/13/18 2208  Patients Current Diet:  Diet Order            Diet Carb Modified Fluid consistency: Thin; Room service appropriate? Yes  Diet effective now              Precautions / Restrictions Precautions Precautions: Fall Restrictions Weight Bearing Restrictions: Yes RLE Weight Bearing: Weight bearing as tolerated   Has the patient had 2 or more falls or a fall with injury in the past year? Yes  Prior Activity Level Limited Community (1-2x/wk): Mod I with cane in community; only uses cane outside and RW if knees bothering her;  drove  Prior Functional Level Self Care: Did the patient need help bathing, dressing, using the toilet or eating? Independent  Indoor Mobility: Did the patient need assistance with walking from room to room (with or without device)? Independent  Stairs: Did the patient need assistance with internal or external stairs (with or without device)? Independent  Functional Cognition: Did the patient need help planning regular tasks such as shopping or remembering to take medications? Independent  Home Assistive Devices / Equipment Home Assistive Devices/Equipment: None Home Equipment: Environmental consultant - 2 wheels, Cane - single point, Bedside commode, Toilet riser  Prior Device Use: Indicate devices/aids used by the patient prior to current illness, exacerbation or injury?  cane in community; RW if knees bothering her  Current Functional Level Cognition  Overall Cognitive Status: Within Functional Limits for tasks assessed Orientation Level: Oriented X4 General Comments: Pt anxious about mobility, tachypnea with exertion.     Extremity Assessment (includes Sensation/Coordination)  Upper Extremity Assessment: Defer to OT evaluation, Overall WFL for tasks assessed  Lower Extremity Assessment: RLE deficits/detail RLE Deficits / Details: post-op deficits as anticipated with IM nail; grossly decr AROM and strength, limited by pain RLE: Unable to fully assess due to pain RLE Coordination: decreased gross motor    ADLs  Overall ADL's : Needs assistance/impaired Eating/Feeding: Modified independent, Sitting Grooming: Set up, Sitting Upper Body Bathing: Minimal assistance, Sitting Lower Body Bathing: Maximal assistance, Sitting/lateral leans Upper Body Dressing : Set up, Sitting Lower Body Dressing: Maximal assistance, +2 for physical assistance, +2 for safety/equipment, Sit to/from stand Lower Body Dressing Details (indicate cue type and reason): unable to access LB for dressing at this time Toilet  Transfer: +2 for physical assistance, Moderate assistance, RW Toilet Transfer Details (indicate cue type and reason): simulated with transfer from recliner to chair Toileting- Clothing Manipulation and Hygiene: Maximal assistance Functional mobility during ADLs: Moderate assistance, +2 for physical assistance, +2 for safety/equipment, Rolling walker, Cueing for sequencing General ADL Comments: Pt declining ADLs, stating she has already bathed and brushed teeth. Pt up in chair upon therapist arrival and would like to return to bed, states she has been up approximately 2 hours.     Mobility  Overal bed mobility: Needs Assistance Bed Mobility: Sit to Supine Supine to sit: Mod assist, +2 for physical assistance, +2 for safety/equipment, HOB elevated Sit to supine: HOB elevated, Mod assist General bed mobility comments: Pt up in chair upon PT arrival to room. Mod assist for LE lifting and translation into bed for sit to supine. Verbal cuing for lowering onto R elbow and assisting in lifting LEs into bed.     Transfers  Overall transfer level: Needs assistance Equipment used: Rolling walker (2 wheeled) Transfers: Sit to/from Stand Sit to Stand: Mod assist Stand pivot transfers: +2 physical assistance, Min assist General transfer comment: Mod assist for power up, steadying upon standing, stabilizing pt's RLE as pt with tendency for mild buckling. PT worked on [place] prior to initiating ambulation with pt, and with stepping forward and back with RLE. Pt breathing quickly and reported pain, improved with increased time standing.     Ambulation / Gait / Stairs / Wheelchair Mobility  Ambulation/Gait Ambulation/Gait assistance: Herbalist (Feet): 3 Feet Assistive device: Rolling walker (2 wheeled) Gait Pattern/deviations: Step-to pattern, Decreased step length - right, Antalgic, Trunk flexed General Gait Details: Min assist for steadying, RLE guarding as pt with tendency  to mildly buckle on RLE. PT instructed pt to step R foot forward, push through UEs, then step L foot forward. Pt requesting going back to bed after 3 ft ambulation due to pain and fatigue.     Posture / Balance Balance Overall balance assessment: Needs assistance Sitting-balance support: Feet supported, No upper extremity supported Sitting balance-Leahy Scale: Good Standing balance support: Bilateral upper extremity supported Standing balance-Leahy Scale: Poor Standing balance comment: dependent on BUE support in standing. Pt instructed in pre-gait activities of weight shifting L and R, stepping forward and back with RLE. Pt stood ~1 minute prior to initiating ambulation.     Special needs/care consideration BiPAP/CPAP  N/a CPM  N/a Continuous Drip IV  N/a Dialysis n/a Life Vest  N/a Oxygen  N/a Special Bed  N/a Trach Size  N/a Wound Vac n/a Skin surgical incision Bowel mgmt:  LBM 4/24 continent Bladder mgmt: incontinent; external catheter Diabetic mgmt:  yes Behavioral consideration  N/a Chemo/radiation  N/a   Previous Home Environment  Living Arrangements: Alone  Lives With: Alone Available Help at Discharge: Family, Available 24 hours/day(niece, [name], will hire asisst as recommended/needed) Type of Home: House Home Layout: One level Home Access: Stairs to enter Entrance Stairs-Rails: Right, Left, Can reach both Entrance Stairs-Number of Steps: 4 Bathroom Shower/Tub: Multimedia programmer: Standard Bathroom Accessibility: Yes How Accessible: Accessible via walker [place]: No  Discharge Living Setting Plans for Discharge Living Setting: Patient's home, Alone Type of Home at Discharge: House Discharge Home Layout: One level Discharge Home Access: Stairs to enter Entrance Stairs-Rails: Right, Left, Can reach both Entrance Stairs-Number of Steps: 4 Discharge Bathroom Shower/Tub: Walk-in shower Discharge Bathroom Toilet: Standard Discharge Bathroom  Accessibility: Yes How Accessible: Accessible via walker Does the patient have any problems obtaining your medications?: No  Social/Family/Support Systems Patient Roles: (widowed for 20 years; niece very helpful. 2 stepchildren) Contact Information: Benson [place], niece Anticipated Caregiver: niece and hired assist  Anticipated Caregiver's Contact Information: [phone] Ability/Limitations of Caregiver: niece can't herself do 24/7 sueprvision but can arrnage hired assist Caregiver Availability: Other (Comment) Discharge Plan Discussed with Primary Caregiver: Yes Is Caregiver In Agreement with Plan?: Yes Does Caregiver/Family have Issues with Lodging/Transportation while Pt is in Rehab?: No  Goals/Additional Needs Patient/Family Goal for Rehab: Mod I to supervision PT, supervision to min assist OT Expected length of stay: ELOS 10 to 14 days Pt/Family Agrees to Admission and willing to participate: Yes Program Orientation Provided & Reviewed with Pt/Caregiver Including Roles  & Responsibilities: Yes  Decrease burden of Care through IP rehab admission: n/a  Possible need for SNF placement upon discharge:  Not anticipated  Patient Condition: I have reviewed medical records from [facility], spoken with CSW, and patient and family member. I met with patient at the bedside for inpatient rehabilitation assessment.  Patient will benefit from ongoing PT and OT, can actively participate in 3 hours of therapy a day 5 days of the week, and can make measurable gains during the admission.  Patient will also benefit from the coordinated team approach during an Inpatient Acute Rehabilitation admission.  The patient will receive intensive therapy as well as Rehabilitation physician, nursing, social worker, and care management interventions.  Due to bladder management, bowel management, safety, skin/wound care, disease management, medication administration, pain management and patient education the  patient requires 24 hour a day rehabilitation nursing.  The patient is currently mod assist with mobility and basic ADLs.  Discharge setting and therapy post discharge at home with home health is anticipated.  Patient has agreed to participate in the Acute Inpatient Rehabilitation Program and will admit today.  Preadmission Screen Completed By:  Cleatrice Burke RN MSN 05/14/2018 10:50 AM ______________________________________________________________________   Discussed status with Dr. Posey Pronto  on  05/14/2018 at  81 and received approval for admission today.  Admission Coordinator:  Cleatrice Burke, RN MSN time 1100 Date  05/14/2018   Assessment/Plan: Diagnosis: right intertrochanteric fracture  1. Does the need for close, 24 hr/day Medical supervision in concert with the patient's rehab needs make it unreasonable for this patient to be served in a less intensive setting? Yes  2. Co-Morbidities requiring supervision/potential complications: colon cancer with resection, diet controlled diabetes mellitus, hypertension, ulcerated colitis  3. Due to bowel management, safety, skin/wound care, disease management, pain management and patient education, does the patient require 24 hr/day rehab nursing? Yes 4. Does the patient require coordinated care of a physician, rehab nurse, PT (1-2 hrs/day, 5 days/week) and OT (1-2 hrs/day, 5 days/week) to address physical and functional deficits in the context of the above medical diagnosis(es)? Yes Addressing deficits in the following areas: balance, endurance, locomotion, strength, transferring, bathing, dressing, toileting and psychosocial support 5. Can the patient actively participate in an intensive therapy program of at least 3 hrs of therapy 5 days a week? Yes 6. The potential for patient to make measurable gains while on inpatient rehab is excellent 7. Anticipated functional outcomes upon discharge from inpatients are: supervision PT, supervision  OT, n/a SLP 8. Estimated rehab length of stay to reach the above functional goals is: 10-14 days. 9. Anticipated D/C setting: Home 10. Anticipated post D/C treatments: HH therapy and Home excercise program 11. Overall Rehab/Functional Prognosis: good  MD Signature: Delice Lesch, MD, ABPMR

## 2018-05-14 NOTE — H&P (Signed)
Physical Medicine and Rehabilitation Admission H&P    Chief Complaint  Patient presents with   Fall   Hip Pain    Shortened and rotated  chief complaint: Hip pain  HPI: Jenna Chambers is an 85 year old right-handed female with history of colon cancer with resection, diet controlled diabetes mellitus,,hypertension, ulcerated colitis.  Per chart review and patient, she lives alone.  One level home 4 steps to entry. Independent using a straight point cane for community ambulation. Independent with ADLs. Presented on 05/10/2018 after a fall.  She reportedly fell backward trying to step on a bug.  No loss of consciousness. Sustained comminuted and angulated intertrochanteric right femur fracture.she underwent IM nailing on 05/10/2018 per Dr. Victorino December. Weightbearing as tolerated.  Hospital course complicated by pain management and acute blood loss anemia, hemoglobin 9.7-10.7 and monitored. Maintained on aspirin for DVT prophylaxis.Therapy evaluations completed and patient was admitted for a comprehensive rehabilitation program.  Review of Systems  Constitutional: Negative for chills and fever.  HENT: Negative for hearing loss.   Eyes: Negative for blurred vision and double vision.  Respiratory: Negative for cough and shortness of breath.   Cardiovascular: Positive for leg swelling. Negative for chest pain and palpitations.  Gastrointestinal: Positive for constipation and nausea. Negative for heartburn and vomiting.  Genitourinary: Negative for dysuria and flank pain.  Musculoskeletal: Positive for joint pain and myalgias.  Skin: Negative for rash.  Neurological: Positive for focal weakness. Negative for sensory change and seizures.  All other systems reviewed and are negative.  Past Medical History:  Diagnosis Date   Colon cancer ([place])    resecetion   Diabetes ([place])    Type 2   Glaucoma    --both eyes per patient getting worse   Gout    Hypertension    Osteopenia    Sleep  apnea    no CPAP   Ulcerative colitis [facility])    Past Surgical History:  Procedure Laterality Date   BILATERAL SALPINGOOPHORECTOMY  1993   serous cystadenoma   CATARACT EXTRACTION  2006, 2008   [place]   incarcerated hernia   INTRAMEDULLARY (IM) NAIL INTERTROCHANTERIC Right 05/10/2018   Procedure: INTRAMEDULLARY (IM) NAIL INTERTROCHANTRIC;  Surgeon: [name], MD;  Location: [place];  Service: Orthopedics;  Laterality: Right;   PARTIAL COLECTOMY  1991   TOTAL KNEE ARTHROPLASTY Left 2010   Family History  Problem Relation Age of Onset   Heart disease Father        dec age 32   Hypertension Father    Osteoarthritis Mother    Osteoporosis Mother    Hypertension Sister    Hypertension Brother    Cancer Brother 75       colon ca   Social History:  reports that she has never smoked. She has never used smokeless tobacco. She reports that she does not drink alcohol or use drugs. Allergies:  Allergies  Allergen Reactions   Latex Rash   Penicillins Rash   Medications Prior to Admission  Medication Sig Dispense Refill   acetaminophen (TYLENOL) 500 MG tablet Take 1,000 mg by mouth every 6 (six) hours as needed.     amLODipine (NORVASC) 10 MG tablet Take 10 mg by mouth daily.     aspirin 81 MG tablet Take 81 mg by mouth daily.     brimonidine (ALPHAGAN P) 0.1 % SOLN Place 1 drop into both eyes 2 (two) times daily.     brinzolamide (  AZOPT) 1 % ophthalmic suspension Place 1 drop into both eyes 2 (two) times a day.     Cholecalciferol (VITAMIN D) 2000 UNITS CAPS Take by mouth.     diclofenac sodium (VOLTAREN) 1 % GEL Apply topically as needed.     folic acid (FOLVITE) 1 MG tablet Take 1 mg by mouth daily.     levothyroxine (SYNTHROID, LEVOTHROID) 150 MCG tablet Take 150 mcg by mouth daily before breakfast.     losartan (COZAAR) 100 MG tablet Take 100 mg by mouth daily.      meloxicam (MOBIC) 15 MG tablet Take 15 mg by  mouth as needed for pain.     sulfaSALAzine (AZULFIDINE) 500 MG tablet Take 1,000 mg by mouth 2 (two) times daily.     Tafluprost, PF, (ZIOPTAN) 0.0015 % SOLN Place 1 drop into both eyes at bedtime.     ZIOPTAN 0.0015 % SOLN Place 1 drop into both eyes daily.  12    Drug Regimen Review Drug regimen was reviewed and remains appropriate with no significant issues identified  Home: Home Living Family/patient expects to be discharged to:: Private residence Living Arrangements: Alone Available Help at Discharge: Family, Available PRN/intermittently(looking into 24 hour caregivers w/long term insurance) Type of Home: House Home Access: Stairs to enter Technical brewer of Steps: 4 Entrance Stairs-Rails: Right, Left, Can reach both Home Layout: One level Bathroom Shower/Tub: Multimedia programmer: Standard Bathroom Accessibility: Yes Home Equipment: Environmental consultant - 2 wheels, Cane - single point, Bedside commode, Toilet riser   Functional History: Prior Function Level of Independence: Independent Comments: uses a SPC for community, does ADL/IADL but does have a cleaner that comes, enjoys theatre  Functional Status:  Mobility: Bed Mobility Overal bed mobility: Needs Assistance Bed Mobility: Sit to Supine Supine to sit: Mod assist, +2 for physical assistance, +2 for safety/equipment, HOB elevated Sit to supine: HOB elevated, Mod assist General bed mobility comments: Pt up in chair upon PT arrival to room. Mod assist for LE lifting and translation into bed for sit to supine. Verbal cuing for lowering onto R elbow and assisting in lifting LEs into bed.  Transfers Overall transfer level: Needs assistance Equipment used: Rolling walker (2 wheeled) Transfers: Sit to/from Stand Sit to Stand: Mod assist Stand pivot transfers: +2 physical assistance, Min assist General transfer comment: Mod assist for power up, steadying upon standing, stabilizing pt's RLE as pt with tendency for  mild buckling. PT worked on [place] prior to initiating ambulation with pt, and with stepping forward and back with RLE. Pt breathing quickly and reported pain, improved with increased time standing.  Ambulation/Gait Ambulation/Gait assistance: Min assist Gait Distance (Feet): 3 Feet Assistive device: Rolling walker (2 wheeled) Gait Pattern/deviations: Step-to pattern, Decreased step length - right, Antalgic, Trunk flexed General Gait Details: Min assist for steadying, RLE guarding as pt with tendency to mildly buckle on RLE. PT instructed pt to step R foot forward, push through UEs, then step L foot forward. Pt requesting going back to bed after 3 ft ambulation due to pain and fatigue.     ADL: ADL Overall ADL's : Needs assistance/impaired Eating/Feeding: Modified independent, Sitting Grooming: Set up, Sitting Upper Body Bathing: Minimal assistance, Sitting Lower Body Bathing: Maximal assistance, Sitting/lateral leans Upper Body Dressing : Set up, Sitting Lower Body Dressing: Maximal assistance, +2 for physical assistance, +2 for safety/equipment, Sit to/from stand Lower Body Dressing Details (indicate cue type and reason): unable to access LB for dressing at this time  Toilet Transfer: +2 for physical assistance, Moderate assistance, RW Toilet Transfer Details (indicate cue type and reason): simulated with transfer from recliner to chair Toileting- Clothing Manipulation and Hygiene: Maximal assistance Functional mobility during ADLs: Moderate assistance, +2 for physical assistance, +2 for safety/equipment, Rolling walker, Cueing for sequencing General ADL Comments: Pt declining ADLs, stating she has already bathed and brushed teeth. Pt up in chair upon therapist arrival and would like to return to bed, states she has been up approximately 2 hours.   Cognition: Cognition Overall Cognitive Status: Within Functional Limits for tasks assessed Orientation Level: Oriented  X4 Cognition Arousal/Alertness: Awake/alert Behavior During Therapy: WFL for tasks assessed/performed, Anxious Overall Cognitive Status: Within Functional Limits for tasks assessed General Comments: Pt anxious about mobility, tachypnea with exertion.   Physical Exam: Blood pressure 140/61, pulse 68, temperature 98.2 F (36.8 C), temperature source Oral, resp. rate 17, height 5\' 3"  (1.6 m), weight 103 kg, last menstrual period 01/15/1982, SpO2 95 %. Physical Exam  Vitals reviewed. Constitutional: She appears well-developed.  Morbid obesity  HENT:  Head: Normocephalic and atraumatic.  Eyes: EOM are normal. Right eye exhibits no discharge. Left eye exhibits no discharge.  Respiratory: Effort normal. No respiratory distress.  GI: She exhibits no distension.  Musculoskeletal:     Comments: Lower extremity edema, right greater than left  Neurological: She is alert.  Patient is alert in no acute distress.  Follows full commands.  Cooperative with exam. Motor: Bilateral upper extremities: 5/5 proximal distal Left lower extremity: Hip flexion, knee extension 4 medicine/5, ankle dorsiflexion 4+/5 Right lower extremity: Hip flexion 2/5, ankle dorsiflexion 4/5  Skin:  Hip incision clean and dry. Appropriately tender  Psychiatric: She has a normal mood and affect. Her behavior is normal.    Results for orders placed or performed during the hospital encounter of 05/10/18 (from the past 48 hour(s))  CBC     Status: Abnormal   Collection Time: 05/13/18 12:40 PM  Result Value Ref Range   WBC 9.9 4.0 - 10.5 K/uL   RBC 3.77 (L) 3.87 - 5.11 MIL/uL   Hemoglobin 10.7 (L) 12.0 - 15.0 g/dL   HCT 32.9 (L) 36.0 - 46.0 %   MCV 87.3 80.0 - 100.0 fL   MCH 28.4 26.0 - 34.0 pg   MCHC 32.5 30.0 - 36.0 g/dL   RDW 13.0 11.5 - 15.5 %   Platelets 165 150 - 400 K/uL   nRBC 0.0 0.0 - 0.2 %    Comment: Performed at [facility], [place] [address] [place], [place] [address]  Basic metabolic panel      Status: Abnormal   Collection Time: 05/13/18 12:40 PM  Result Value Ref Range   Sodium 134 (L) 135 - 145 mmol/L   Potassium 4.0 3.5 - 5.1 mmol/L   Chloride 97 (L) 98 - 111 mmol/L   CO2 25 22 - 32 mmol/L   Glucose, Bld 198 (H) 70 - 99 mg/dL   BUN 16 8 - 23 mg/dL   Creatinine, Ser 0.96 0.44 - 1.00 mg/dL   Calcium 9.3 8.9 - 10.3 mg/dL   GFR calc non Af Amer 54 (L) >60 mL/min   GFR calc Af Amer >60 >60 mL/min   Anion gap 12 5 - 15    Comment: Performed at [facility] [address], [place], [place] [address]   No results found.     Medical Problem List and Plan: 1.  Decreased functional mobility secondary to comminuted angulated  intertrochanteric right femur fracture. Status post IM nailing 05/10/2018. Weightbearing as tolerated.  Admit to CIR 2.  Antithrombotics: -DVT/anticoagulation:  SCDs.  Vascular Doppler ordered  -antiplatelet therapy: Aspirin 81 mg daily 3. Pain Management: Hydrocodone and Robaxin as needed.  Monitor with increased mobility, patient concerned about pain with therapies. 4. Mood:  Provide emotional support  -antipsychotic agents: N/A 5. Neuropsych: This patient is capable of making decisions on her own behalf. 6. Skin/Wound Care:  Routine skin checks 7. Fluids/Electrolytes/Nutrition:  Routine in and out's.  Follow-up BMP ordered for tomorrow morning. 8. Acute blood loss anemia. Follow-up CBC ordered for tomorrow morning. 9. Hypertension. Norvasc 10 mg daily, Cozaar 100 mg daily.  Monitor with increased mobility. 10. Hypothyroidism. Synthroid 11.History of ulcerative colitis as well as colon cancer with resection. Continue Azulfidine 1000 mg twice a day 12. Diet-controlled diabetes mellitus. Blood sugar checks discontinued. Continue diabetic diet.  Monitor with increased mobility. 13. Constipation. Laxative assistance  14.  Morbid obesity: Encouraged weight loss  Cathlyn Parsons, PA-C 05/14/2018

## 2018-05-14 NOTE — H&P (Signed)
Physical Medicine and Rehabilitation Admission H&P    Chief Complaint  Patient presents with  . Fall  . Hip Pain    Shortened and rotated  chief complaint: Hip pain  HPI: Jenna Chambers is an 85 year old right-handed female with history of colon cancer with resection, diet controlled diabetes mellitus,,hypertension, ulcerated colitis.  Per chart review and patient, she lives alone.  One level home 4 steps to entry. Independent using a straight point cane for community ambulation. Independent with ADLs. Presented on 05/10/2018 after a fall.  She reportedly fell backward trying to step on a bug.  No loss of consciousness. Sustained comminuted and angulated intertrochanteric right femur fracture.she underwent IM nailing on 05/10/2018 per Dr. Victorino December. Weightbearing as tolerated.  Hospital course complicated by pain management and acute blood loss anemia, hemoglobin 9.7-10.7 and monitored. Maintained on aspirin for DVT prophylaxis.Therapy evaluations completed and patient was admitted for a comprehensive rehabilitation program.  Review of Systems  Constitutional: Negative for chills and fever.  HENT: Negative for hearing loss.   Eyes: Negative for blurred vision and double vision.  Respiratory: Negative for cough and shortness of breath.   Cardiovascular: Positive for leg swelling. Negative for chest pain and palpitations.  Gastrointestinal: Positive for constipation and nausea. Negative for heartburn and vomiting.  Genitourinary: Negative for dysuria and flank pain.  Musculoskeletal: Positive for joint pain and myalgias.  Skin: Negative for rash.  Neurological: Positive for focal weakness. Negative for sensory change and seizures.  All other systems reviewed and are negative.  Past Medical History:  Diagnosis Date  . Colon cancer ([place])    resecetion  . Diabetes ([place])    Type 2  . Glaucoma    --both eyes per patient getting worse  . Gout   . Hypertension   . Osteopenia   . Sleep  apnea    no CPAP  . Ulcerative colitis [facility])    Past Surgical History:  Procedure Laterality Date  . BILATERAL SALPINGOOPHORECTOMY  1993   serous cystadenoma  . CATARACT EXTRACTION  2006, 2008   gluacoma  . EXPLORATORY LAPAROTOMY  1996   incarcerated hernia  . INTRAMEDULLARY (IM) NAIL INTERTROCHANTERIC Right 05/10/2018   Procedure: INTRAMEDULLARY (IM) NAIL INTERTROCHANTRIC;  Surgeon: [name], MD;  Location: [facility];  Service: Orthopedics;  Laterality: Right;  . PARTIAL COLECTOMY  1991  . TOTAL KNEE ARTHROPLASTY Left 2010   Family History  Problem Relation Age of Onset  . Heart disease Father        dec age 27  . Hypertension Father   . Osteoarthritis Mother   . Osteoporosis Mother   . Hypertension Sister   . Hypertension Brother   . Cancer Brother 40       colon ca   Social History:  reports that she has never smoked. She has never used smokeless tobacco. She reports that she does not drink alcohol or use drugs. Allergies:  Allergies  Allergen Reactions  . Latex Rash  . Penicillins Rash   Medications Prior to Admission  Medication Sig Dispense Refill  . acetaminophen (TYLENOL) 500 MG tablet Take 1,000 mg by mouth every 6 (six) hours as needed.    Marland Kitchen amLODipine (NORVASC) 10 MG tablet Take 10 mg by mouth daily.    Marland Kitchen aspirin 81 MG tablet Take 81 mg by mouth daily.    . brimonidine (ALPHAGAN P) 0.1 % SOLN Place 1 drop into both eyes 2 (two) times daily.    . brinzolamide (  AZOPT) 1 % ophthalmic suspension Place 1 drop into both eyes 2 (two) times a day.    . Cholecalciferol (VITAMIN D) 2000 UNITS CAPS Take by mouth.    . diclofenac sodium (VOLTAREN) 1 % GEL Apply topically as needed.    . folic acid (FOLVITE) 1 MG tablet Take 1 mg by mouth daily.    Marland Kitchen levothyroxine (SYNTHROID, LEVOTHROID) 150 MCG tablet Take 150 mcg by mouth daily before breakfast.    . losartan (COZAAR) 100 MG tablet Take 100 mg by mouth daily.     . meloxicam (MOBIC) 15 MG tablet Take 15 mg by  mouth as needed for pain.    Marland Kitchen sulfaSALAzine (AZULFIDINE) 500 MG tablet Take 1,000 mg by mouth 2 (two) times daily.    . Tafluprost, PF, (ZIOPTAN) 0.0015 % SOLN Place 1 drop into both eyes at bedtime.    Marland Kitchen ZIOPTAN 0.0015 % SOLN Place 1 drop into both eyes daily.  12    Drug Regimen Review Drug regimen was reviewed and remains appropriate with no significant issues identified  Home: Home Living Family/patient expects to be discharged to:: Private residence Living Arrangements: Alone Available Help at Discharge: Family, Available PRN/intermittently(looking into 24 hour caregivers w/long term insurance) Type of Home: House Home Access: Stairs to enter Technical brewer of Steps: 4 Entrance Stairs-Rails: Right, Left, Can reach both Home Layout: One level Bathroom Shower/Tub: Multimedia programmer: Standard Bathroom Accessibility: Yes Home Equipment: Environmental consultant - 2 wheels, Cane - single point, Bedside commode, Toilet riser   Functional History: Prior Function Level of Independence: Independent Comments: uses a SPC for community, does ADL/IADL but does have a cleaner that comes, enjoys theatre  Functional Status:  Mobility: Bed Mobility Overal bed mobility: Needs Assistance Bed Mobility: Sit to Supine Supine to sit: Mod assist, +2 for physical assistance, +2 for safety/equipment, HOB elevated Sit to supine: HOB elevated, Mod assist General bed mobility comments: Pt up in chair upon PT arrival to room. Mod assist for LE lifting and translation into bed for sit to supine. Verbal cuing for lowering onto R elbow and assisting in lifting LEs into bed.  Transfers Overall transfer level: Needs assistance Equipment used: Rolling walker (2 wheeled) Transfers: Sit to/from Stand Sit to Stand: Mod assist Stand pivot transfers: +2 physical assistance, Min assist General transfer comment: Mod assist for power up, steadying upon standing, stabilizing pt's RLE as pt with tendency for  mild buckling. PT worked on [place] prior to initiating ambulation with pt, and with stepping forward and back with RLE. Pt breathing quickly and reported pain, improved with increased time standing.  Ambulation/Gait Ambulation/Gait assistance: Min assist Gait Distance (Feet): 3 Feet Assistive device: Rolling walker (2 wheeled) Gait Pattern/deviations: Step-to pattern, Decreased step length - right, Antalgic, Trunk flexed General Gait Details: Min assist for steadying, RLE guarding as pt with tendency to mildly buckle on RLE. PT instructed pt to step R foot forward, push through UEs, then step L foot forward. Pt requesting going back to bed after 3 ft ambulation due to pain and fatigue.     ADL: ADL Overall ADL's : Needs assistance/impaired Eating/Feeding: Modified independent, Sitting Grooming: Set up, Sitting Upper Body Bathing: Minimal assistance, Sitting Lower Body Bathing: Maximal assistance, Sitting/lateral leans Upper Body Dressing : Set up, Sitting Lower Body Dressing: Maximal assistance, +2 for physical assistance, +2 for safety/equipment, Sit to/from stand Lower Body Dressing Details (indicate cue type and reason): unable to access LB for dressing at this time  Toilet Transfer: +2 for physical assistance, Moderate assistance, RW Toilet Transfer Details (indicate cue type and reason): simulated with transfer from recliner to chair Toileting- Clothing Manipulation and Hygiene: Maximal assistance Functional mobility during ADLs: Moderate assistance, +2 for physical assistance, +2 for safety/equipment, Rolling walker, Cueing for sequencing General ADL Comments: Pt declining ADLs, stating she has already bathed and brushed teeth. Pt up in chair upon therapist arrival and would like to return to bed, states she has been up approximately 2 hours.   Cognition: Cognition Overall Cognitive Status: Within Functional Limits for tasks assessed Orientation Level: Oriented X4  Cognition Arousal/Alertness: Awake/alert Behavior During Therapy: WFL for tasks assessed/performed, Anxious Overall Cognitive Status: Within Functional Limits for tasks assessed General Comments: Pt anxious about mobility, tachypnea with exertion.   Physical Exam: Blood pressure 140/61, pulse 68, temperature 98.2 F (36.8 C), temperature source Oral, resp. rate 17, height 5\' 3"  (1.6 m), weight 103 kg, last menstrual period 01/15/1982, SpO2 95 %. Physical Exam  Vitals reviewed. Constitutional: She appears well-developed.  Morbid obesity  HENT:  Head: Normocephalic and atraumatic.  Eyes: EOM are normal. Right eye exhibits no discharge. Left eye exhibits no discharge.  Respiratory: Effort normal. No respiratory distress.  GI: She exhibits no distension.  Musculoskeletal:     Comments: Lower extremity edema, right greater than left  Neurological: She is alert.  Patient is alert in no acute distress.  Follows full commands.  Cooperative with exam. Motor: Bilateral upper extremities: 5/5 proximal distal Left lower extremity: Hip flexion, knee extension 4 medicine/5, ankle dorsiflexion 4+/5 Right lower extremity: Hip flexion 2/5, ankle dorsiflexion 4/5  Skin:  Hip incision clean and dry. Appropriately tender  Psychiatric: She has a normal mood and affect. Her behavior is normal.    Results for orders placed or performed during the hospital encounter of 05/10/18 (from the past 48 hour(s))  CBC     Status: Abnormal   Collection Time: 05/13/18 12:40 PM  Result Value Ref Range   WBC 9.9 4.0 - 10.5 K/uL   RBC 3.77 (L) 3.87 - 5.11 MIL/uL   Hemoglobin 10.7 (L) 12.0 - 15.0 g/dL   HCT 32.9 (L) 36.0 - 46.0 %   MCV 87.3 80.0 - 100.0 fL   MCH 28.4 26.0 - 34.0 pg   MCHC 32.5 30.0 - 36.0 g/dL   RDW 13.0 11.5 - 15.5 %   Platelets 165 150 - 400 K/uL   nRBC 0.0 0.0 - 0.2 %    Comment: Performed at [facility], [place] [address], [place], [place] [address]  Basic metabolic panel      Status: Abnormal   Collection Time: 05/13/18 12:40 PM  Result Value Ref Range   Sodium 134 (L) 135 - 145 mmol/L   Potassium 4.0 3.5 - 5.1 mmol/L   Chloride 97 (L) 98 - 111 mmol/L   CO2 25 22 - 32 mmol/L   Glucose, Bld 198 (H) 70 - 99 mg/dL   BUN 16 8 - 23 mg/dL   Creatinine, Ser 0.96 0.44 - 1.00 mg/dL   Calcium 9.3 8.9 - 10.3 mg/dL   GFR calc non Af Amer 54 (L) >60 mL/min   GFR calc Af Amer >60 >60 mL/min   Anion gap 12 5 - 15    Comment: Performed at [facility] [address], [place], [place] [address]   No results found.     Medical Problem List and Plan: 1.  Decreased functional mobility secondary to comminuted angulated  intertrochanteric right femur fracture. Status post IM nailing 05/10/2018. Weightbearing as tolerated.  Admit to CIR 2.  Antithrombotics: -DVT/anticoagulation:  SCDs.  Vascular Doppler ordered  -antiplatelet therapy: Aspirin 81 mg daily 3. Pain Management: Hydrocodone and Robaxin as needed.  Monitor with increased mobility, patient concerned about pain with therapies. 4. Mood:  Provide emotional support  -antipsychotic agents: N/A 5. Neuropsych: This patient is capable of making decisions on her own behalf. 6. Skin/Wound Care:  Routine skin checks 7. Fluids/Electrolytes/Nutrition:  Routine in and out's.  Follow-up BMP ordered for tomorrow morning. 8. Acute blood loss anemia. Follow-up CBC ordered for tomorrow morning. 9. Hypertension. Norvasc 10 mg daily, Cozaar 100 mg daily.  Monitor with increased mobility. 10. Hypothyroidism. Synthroid 11.History of ulcerative colitis as well as colon cancer with resection. Continue Azulfidine 1000 mg twice a day 12. Diet-controlled diabetes mellitus. Blood sugar checks discontinued. Continue diabetic diet.  Monitor with increased mobility. 13. Constipation. Laxative assistance  14.  Morbid obesity: Encouraged weight loss  Post Admission Physician Evaluation: 1. Preadmission assessment reviewed and changes  made below. 2. Functional deficits secondary  to right hip fracture status post IM nailing. 3. Patient is admitted to receive collaborative, interdisciplinary care between the physiatrist, rehab nursing staff, and therapy team. 4. Patient's level of medical complexity and substantial therapy needs in context of that medical necessity cannot be provided at a lesser intensity of care such as a SNF. 5. Patient has experienced substantial functional loss from his/her baseline which was documented above under the "Functional History" and "Functional Status" headings.  Judging by the patient's diagnosis, physical exam, and functional history, the patient has potential for functional progress which will result in measurable gains while on inpatient rehab.  These gains will be of substantial and practical use upon discharge  in facilitating mobility and self-care at the household level. 6. Physiatrist will provide 24 hour management of medical needs as well as oversight of the therapy plan/treatment and provide guidance as appropriate regarding the interaction of the two. 7. 24 hour rehab nursing will assist with bowel management, safety, skin/wound care, disease management, pain management and patient education  and help integrate therapy concepts, techniques,education, etc. 8. PT will assess and treat for/with: Lower extremity strength, range of motion, stamina, balance, functional mobility, safety, adaptive techniques and equipment, coping skills, pain control, education. Goals are: Supervision/min a. 9. OT will assess and treat for/with: ADL's, functional mobility, safety, upper extremity strength, adaptive techniques and equipment, ego support, and community reintegration.   Goals are: Supervision/min a. Therapy may proceed with showering this patient. 10. Case Management and Social Worker will assess and treat for psychological issues and discharge planning. 11. Team conference will be held weekly to assess  progress toward goals and to determine barriers to discharge. 12. Patient will receive at least 3 hours of therapy per day at least 5 days per week. 13. ELOS: 11-15 days.       14. Prognosis:  good  I have personally performed a face to face diagnostic evaluation, including, but not limited to relevant history and physical exam findings, of this patient and developed relevant assessment and plan.  Additionally, I have reviewed and concur with the physician assistant's documentation above.  Delice Lesch, MD, ABPMR Lavon Paganini Angiulli, PA-C 05/14/2018

## 2018-05-14 NOTE — TOC Transition Note (Signed)
Transition of Care [facility]) - CM/SW Discharge Note   Patient Details  Name: Jenna Chambers MRN: [id number] Date of Birth: 1931/10/09  Transition of [facility]) CM/SW Contact:  Alexander Mt, [place] Phone Number: 05/14/2018, 11:29 AM   Clinical Narrative:    Pt accepted to CIR, transfer today per Pamala Hurry.  Signing off.    Final next level of care: IP Rehab Facility Barriers to Discharge: Barriers Resolved   Patient Goals and CMS Choice Patient states their goals for this hospitalization and ongoing recovery are:: "to get better and get back home, as soon as possible" CMS Medicare.gov Compare Post Acute Care list provided to:: Patient Represenative (must comment)(pt niece [name]) Choice offered to / list presented to : Patient  Discharge Placement      Discharge Plan and Services In-house Referral: Clinical Social Work   Post Acute Care Choice: Museum/gallery conservator, Home Health           Social Determinants of Health (SDOH) Interventions     Readmission Risk Interventions No flowsheet data found.

## 2018-05-14 NOTE — Progress Notes (Signed)
Occupational Therapy Treatment Patient Details Name: Jenna Chambers MRN: [id number] DOB: [birth year] Today's Date: 05/14/2018    History of present illness Pt is a 85 y.o. female admitted 05/10/18 after mechanical fall at home trying to step on a bug, sustaining R hip intertrochanteric fx. Now s/p RLE IM nail. PMH includes OSA, HTN, DM, remote colon CA.    OT comments  Pt making progress towards goals. Pt able to transfer to [facility] (wide) with mod A +2 for safety and cues for sequencing with RW for safety. Min Guard for peri care with lateral leans and mod A for boost (therapist performing perianal care for thoroughness) Pt then able to perform short in room mobility with mod A +2 safety with RW for assist and multimodal cues for sequencing. Pt continues to require CIR level therapy to maximize safety and independence in ADL and functional transfers.   Follow Up Recommendations  Supervision/Assistance - 24 hour;Follow surgeon's recommendation for DC plan and follow-up therapies;CIR    Equipment Recommendations  Tub/shower bench;Other (comment)(defer to next venue)    Recommendations for Other Services      Precautions / Restrictions Precautions Precautions: Fall Restrictions Weight Bearing Restrictions: Yes RLE Weight Bearing: Weight bearing as tolerated       Mobility Bed Mobility Overal bed mobility: Needs Assistance Bed Mobility: Supine to Sit     Supine to sit: Mod assist;HOB elevated     General bed mobility comments: ModA to assist RLE to EOB and scooting to EOB; heavy reliance on LLE and BUE assist  Transfers Overall transfer level: Needs assistance Equipment used: Rolling walker (2 wheeled) Transfers: Sit to/from [facility] Sit to Stand: Min assist;Mod assist Stand pivot transfers: +2 physical assistance;Min assist       General transfer comment: Pt able to stand from bed with initial minA to assist trunk elevation to RW; stood from [facility] with modA and  increasing fatigue. Poor balance when transitioning UE support to RW    Balance Overall balance assessment: Needs assistance Sitting-balance support: Feet supported;No upper extremity supported Sitting balance-Leahy Scale: Good     Standing balance support: Bilateral upper extremity supported Standing balance-Leahy Scale: Poor Standing balance comment: Heavy reliance on UE support while standing                           ADL either performed or assessed with clinical judgement   ADL Overall ADL's : Needs assistance/impaired     Grooming: Set up;Sitting                   Toilet Transfer: Moderate assistance;+2 for safety/equipment;Stand-pivot;BSC;RW;Requires wide/bariatric Toilet Transfer Details (indicate cue type and reason): wide BSC and cues for sequencing Toileting- Clothing Manipulation and Hygiene: Moderate assistance;Sit to/from stand Toileting - Clothing Manipulation Details (indicate cue type and reason): Pt able to perform with lateral leans and with A to stand, therapist performed perianal care in standing for thoroughness.      Functional mobility during ADLs: Moderate assistance;+2 for safety/equipment;Cueing for sequencing;Rolling walker       Vision   Vision Assessment?: No apparent visual deficits   Perception     Praxis      Cognition Arousal/Alertness: Awake/alert Behavior During Therapy: WFL for tasks assessed/performed;Anxious Overall Cognitive Status: Within Functional Limits for tasks assessed  General Comments: Anxious with mobility, tachypneic requiring cues for breathing throughout        Exercises     Shoulder Instructions       General Comments      Pertinent Vitals/ Pain       Pain Assessment: 0-10 Pain Score: 9  Pain Location: R hip  Pain Descriptors / Indicators: Discomfort;Grimacing;Sore Pain Intervention(s): Monitored during session;Repositioned;Patient  requesting pain meds-RN notified  Home Living                                          Prior Functioning/Environment              Frequency  Min 2X/week        Progress Toward Goals  OT Goals(current goals can now be found in the care plan section)  Progress towards OT goals: Progressing toward goals  Acute Rehab OT Goals Patient Stated Goal: To go to CIR today OT Goal Formulation: With patient Time For Goal Achievement: 05/25/18 Potential to Achieve Goals: Good  Plan Discharge plan remains appropriate    Co-evaluation    PT/OT/SLP Co-Evaluation/Treatment: Yes Reason for Co-Treatment: For patient/therapist safety;To address functional/ADL transfers PT goals addressed during session: Mobility/safety with mobility;Balance;Proper use of DME OT goals addressed during session: ADL's and self-care;Proper use of Adaptive equipment and DME      AM-PAC OT "6 Clicks" Daily Activity     Outcome Measure   Help from another person eating meals?: None Help from another person taking care of personal grooming?: A Little(seated) Help from another person toileting, which includes using toliet, bedpan, or urinal?: A Lot Help from another person bathing (including washing, rinsing, drying)?: A Lot Help from another person to put on and taking off regular upper body clothing?: A Little Help from another person to put on and taking off regular lower body clothing?: A Lot 6 Click Score: 16    End of Session Equipment Utilized During Treatment: Rolling walker;Gait belt  OT Visit Diagnosis: Unsteadiness on feet (R26.81);Other abnormalities of gait and mobility (R26.89);History of falling (Z91.81);Muscle weakness (generalized) (M62.81);Pain Pain - Right/Left: Right Pain - part of body: Hip   Activity Tolerance Patient tolerated treatment well   Patient Left in chair;with call bell/phone within reach;with chair alarm set   Nurse Communication Mobility  status;Patient requests pain meds        Time: 4010-2725 OT Time Calculation (min): 28 min  Charges: OT General Charges $OT Visit: 1 Visit OT Treatments $Self Care/Home Management : 8-22 mins  Hulda Humphrey OTR/L Acute Rehabilitation Services Pager: [phone] Office: [place] 05/14/2018, 3:26 PM

## 2018-05-14 NOTE — Progress Notes (Signed)
1550 Pt is A&O x4,  discharged to [facility] rehab [phone]. Report was given to [facility].

## 2018-05-14 NOTE — Progress Notes (Signed)
Physical Therapy Treatment Patient Details Name: Jenna Chambers MRN: [id number] DOB: May 11, 1931 Today's Date: 05/14/2018    History of Present Illness Pt is a 85 y.o. female admitted 05/10/18 after mechanical fall at home trying to step on a bug, sustaining R hip intertrochanteric fx. Now s/p RLE IM nail. PMH includes OSA, HTN, DM, remote colon CA.    PT Comments    Pt progressing with mobility. Pt currently requiring up to modA for mobility and assist for ADLs; ambulation distance limited by significant RLE pain with mobility. Despite this, pt remains pleasant and motivated to participate. Continue to recommend intensive CIR-level therapies to maximize functional mobility and return to independent PLOF.    Follow Up Recommendations  CIR;Supervision/Assistance - 24 hour     Equipment Recommendations  (TBD next venue)    Recommendations for Other Services       Precautions / Restrictions Precautions Precautions: Fall Restrictions Weight Bearing Restrictions: Yes RLE Weight Bearing: Weight bearing as tolerated    Mobility  Bed Mobility Overal bed mobility: Needs Assistance Bed Mobility: Supine to Sit     Supine to sit: Mod assist;HOB elevated     General bed mobility comments: ModA to assist RLE to EOB and scooting to EOB; heavy reliance on LLE and BUE assist  Transfers Overall transfer level: Needs assistance Equipment used: Rolling walker (2 wheeled) Transfers: Sit to/from Stand Sit to Stand: Min assist;Mod assist         General transfer comment: Pt able to stand from bed with initial minA to assist trunk elevation to RW; stood from [facility] with modA and increasing fatigue. Poor balance when transitioning UE support to RW  Ambulation/Gait Ambulation/Gait assistance: Min assist;+2 safety/equipment Gait Distance (Feet): 5 Feet Assistive device: Rolling walker (2 wheeled) Gait Pattern/deviations: Step-to pattern;Decreased weight shift to right;Antalgic;Trunk flexed Gait  velocity: Decreased   General Gait Details: Slow, antalgic gait with RW and minA for steadying assist. RLE buckling noted with increased fatigue. Cues for sequencing with steps. Pt tachypneic requiring cues for deep breathing. C/o RLE 9/10 pain   Stairs             Wheelchair Mobility    Modified Rankin (Stroke Patients Only)       Balance Overall balance assessment: Needs assistance Sitting-balance support: Feet supported;No upper extremity supported Sitting balance-Leahy Scale: Good     Standing balance support: Bilateral upper extremity supported Standing balance-Leahy Scale: Poor Standing balance comment: Heavy reliance on UE support while standing                            Cognition Arousal/Alertness: Awake/alert Behavior During Therapy: WFL for tasks assessed/performed;Anxious Overall Cognitive Status: Within Functional Limits for tasks assessed                                 General Comments: Anxious with mobility, tachypneic requiring cues for breathing throughout      Exercises      General Comments        Pertinent Vitals/Pain Pain Assessment: 0-10 Pain Score: 9 (5/10 rest, 9/10 mobility) Pain Location: R hip  Pain Descriptors / Indicators: Discomfort;Grimacing;Sore Pain Intervention(s): Limited activity within patient's tolerance;Patient requesting pain meds-RN notified    Home Living     Available Help at Discharge: Family;Available 24 hours/day(niece, [name], will hire asisst as recommended/needed)  Prior Function            PT Goals (current goals can now be found in the care plan section) Acute Rehab PT Goals Patient Stated Goal: To go to CIR today PT Goal Formulation: With patient Time For Goal Achievement: 05/25/18 Potential to Achieve Goals: Good Progress towards PT goals: Progressing toward goals    Frequency    Min 3X/week      PT Plan Current plan remains appropriate     Co-evaluation PT/OT/SLP Co-Evaluation/Treatment: Yes Reason for Co-Treatment: For patient/therapist safety;To address functional/ADL transfers PT goals addressed during session: Mobility/safety with mobility;Balance;Proper use of DME        AM-PAC PT "6 Clicks" Mobility   Outcome Measure  Help needed turning from your back to your side while in a flat bed without using bedrails?: A Little Help needed moving from lying on your back to sitting on the side of a flat bed without using bedrails?: A Lot Help needed moving to and from a bed to a chair (including a wheelchair)?: A Lot Help needed standing up from a chair using your arms (e.g., wheelchair or bedside chair)?: A Lot Help needed to walk in hospital room?: A Little Help needed climbing 3-5 steps with a railing? : Total 6 Click Score: 13    End of Session Equipment Utilized During Treatment: Gait belt Activity Tolerance: Patient tolerated treatment well;Patient limited by pain Patient left: in chair;with call bell/phone within reach;with chair alarm set Nurse Communication: Mobility status PT Visit Diagnosis: Unsteadiness on feet (R26.81);Muscle weakness (generalized) (M62.81);Pain;Difficulty in walking, not elsewhere classified (R26.2) Pain - Right/Left: Right Pain - part of body: Hip     Time: 8127-5170 PT Time Calculation (min) (ACUTE ONLY): 28 min  Charges:  $Gait Training: 8-22 mins                    Mabeline Caras, PT, DPT Acute Rehabilitation Services  Pager [phone] Office [place] 05/14/2018, 2:21 PM

## 2018-05-15 ENCOUNTER — Inpatient Hospital Stay (HOSPITAL_COMMUNITY): Payer: Medicare Other

## 2018-05-15 ENCOUNTER — Encounter (HOSPITAL_COMMUNITY): Payer: Self-pay

## 2018-05-15 ENCOUNTER — Inpatient Hospital Stay (HOSPITAL_COMMUNITY): Payer: Medicare Other | Admitting: Physical Therapy

## 2018-05-15 ENCOUNTER — Inpatient Hospital Stay (HOSPITAL_COMMUNITY): Payer: Medicare Other | Admitting: Occupational Therapy

## 2018-05-15 DIAGNOSIS — E1169 Type 2 diabetes mellitus with other specified complication: Secondary | ICD-10-CM

## 2018-05-15 DIAGNOSIS — I10 Essential (primary) hypertension: Secondary | ICD-10-CM

## 2018-05-15 DIAGNOSIS — S72001A Fracture of unspecified part of neck of right femur, initial encounter for closed fracture: Secondary | ICD-10-CM

## 2018-05-15 DIAGNOSIS — M7989 Other specified soft tissue disorders: Secondary | ICD-10-CM

## 2018-05-15 DIAGNOSIS — E669 Obesity, unspecified: Secondary | ICD-10-CM

## 2018-05-15 DIAGNOSIS — M1711 Unilateral primary osteoarthritis, right knee: Secondary | ICD-10-CM

## 2018-05-15 LAB — COMPREHENSIVE METABOLIC PANEL
ALT: 11 U/L (ref 0–44)
AST: 17 U/L (ref 15–41)
Albumin: 3.1 g/dL — ABNORMAL LOW (ref 3.5–5.0)
Alkaline Phosphatase: 66 U/L (ref 38–126)
Anion gap: 10 (ref 5–15)
BUN: 17 mg/dL (ref 8–23)
CO2: 24 mmol/L (ref 22–32)
Calcium: 8.8 mg/dL — ABNORMAL LOW (ref 8.9–10.3)
Chloride: 101 mmol/L (ref 98–111)
Creatinine, Ser: 0.97 mg/dL (ref 0.44–1.00)
GFR calc Af Amer: >60 mL/min (ref >60)
GFR calc non Af Amer: 53 mL/min — ABNORMAL LOW (ref >60)
Glucose, Bld: 129 mg/dL — ABNORMAL HIGH (ref 70–99)
Potassium: 3.9 mmol/L (ref 3.5–5.1)
Sodium: 135 mmol/L (ref 135–145)
Total Bilirubin: 1 mg/dL (ref 0.3–1.2)
Total Protein: 6.3 g/dL — ABNORMAL LOW (ref 6.5–8.1)

## 2018-05-15 LAB — GLUCOSE, CAPILLARY
Glucose-Capillary: 132 mg/dL — ABNORMAL HIGH (ref 70–99)
Glucose-Capillary: 131 mg/dL — ABNORMAL HIGH (ref 70–99)
Glucose-Capillary: 119 mg/dL — ABNORMAL HIGH (ref 70–99)
Glucose-Capillary: 133 mg/dL — ABNORMAL HIGH (ref 70–99)

## 2018-05-15 LAB — CBC WITH DIFFERENTIAL/PLATELET
Abs Immature Granulocytes: 0.04 10*3/uL (ref 0.00–0.07)
Basophils Absolute: 0.1 10*3/uL (ref 0.0–0.1)
Basophils Relative: 1 %
Eosinophils Absolute: 0.1 10*3/uL (ref 0.0–0.5)
Eosinophils Relative: 1 %
HCT: 32.4 % — ABNORMAL LOW (ref 36.0–46.0)
Hemoglobin: 10.4 g/dL — ABNORMAL LOW (ref 12.0–15.0)
Immature Granulocytes: 1 %
Lymphocytes Relative: 17 %
Lymphs Abs: 1.3 10*3/uL (ref 0.7–4.0)
MCH: 27.8 pg (ref 26.0–34.0)
MCHC: 32.1 g/dL (ref 30.0–36.0)
MCV: 86.6 fL (ref 80.0–100.0)
Monocytes Absolute: 0.6 10*3/uL (ref 0.1–1.0)
Monocytes Relative: 8 %
Neutro Abs: 5.5 10*3/uL (ref 1.7–7.7)
Neutrophils Relative %: 72 %
Platelets: 197 10*3/uL (ref 150–400)
RBC: 3.74 MIL/uL — ABNORMAL LOW (ref 3.87–5.11)
RDW: 13.1 % (ref 11.5–15.5)
WBC: 7.6 10*3/uL (ref 4.0–10.5)
nRBC: 0 % (ref 0.0–0.2)

## 2018-05-15 MED ORDER — DICLOFENAC SODIUM 1 % TD GEL
2.0000 g | Freq: Four times a day (QID) | TRANSDERMAL | Status: DC
  Administered 2018-05-15 – 2018-05-29 (×55): 2 g via TOPICAL
  Filled 2018-05-15 (×2): qty 100

## 2018-05-15 MED ORDER — LEVOTHYROXINE SODIUM 75 MCG PO TABS
150.0000 ug | ORAL_TABLET | Freq: Every day | ORAL | Status: DC
  Administered 2018-05-15 – 2018-05-29 (×15): 150 ug via ORAL
  Filled 2018-05-15 (×15): qty 2

## 2018-05-15 NOTE — Evaluation (Signed)
Occupational Therapy Assessment and Plan  Patient Details  Name: Jenna Chambers MRN: [id number] Date of Birth: 03-Jan-1932  OT Diagnosis: acute pain, muscle weakness (generalized), pain in joint and swelling of limb Rehab Potential: Rehab Potential (ACUTE ONLY): Excellent ELOS: 2 weeks   Today's Date: 05/15/2018 OT Individual Time: 8101-7510 OT Individual Time Calculation (min): 75 min     Problem List:  Patient Active Problem List   Diagnosis Date Noted  . Displaced fracture of right femoral neck ([place]) 05/14/2018  . Essential hypertension   . Hypothyroidism   . Acute blood loss anemia 05/13/2018  . Obesity, Class III, BMI 40-49.9 (morbid obesity) ([place]) 05/13/2018  . Hip fracture ([place]) 05/10/2018  . Colon cancer ([place]) 02/27/2013  . Essential hypertension, benign 02/27/2013  . Glaucoma 02/27/2013  . Ulcerative colitis ([place]) 02/27/2013  . Diabetes [facility])     Past Medical History:  Past Medical History:  Diagnosis Date  . Colon cancer ([place])    resecetion  . Diabetes ([place])    Type 2  . Glaucoma    --both eyes per patient getting worse  . Gout   . Hypertension   . Osteopenia   . Sleep apnea    no CPAP  . Ulcerative colitis [facility])    Past Surgical History:  Past Surgical History:  Procedure Laterality Date  . BILATERAL SALPINGOOPHORECTOMY  1993   serous cystadenoma  . CATARACT EXTRACTION  2006, 2008   gluacoma  . EXPLORATORY LAPAROTOMY  1996   incarcerated hernia  . INTRAMEDULLARY (IM) NAIL INTERTROCHANTERIC Right 05/10/2018   Procedure: INTRAMEDULLARY (IM) NAIL INTERTROCHANTRIC;  Surgeon: [name], MD;  Location: [facility];  Service: Orthopedics;  Laterality: Right;  . PARTIAL COLECTOMY  1991  . TOTAL KNEE ARTHROPLASTY Left 2010    Assessment & Plan Clinical Impression: Jenna Chambers is an 85 year old right-handed female with history of colon cancer with resection, diet controlled diabetes mellitus,,hypertension, ulcerated colitis.  Per chart review and  patient, she lives alone.  One level home 4 steps to entry. Independent using a straight point cane for community ambulation. Independent with ADLs. Presented on 05/10/2018 after a fall.  She reportedly fell backward trying to step on a bug.  No loss of consciousness. Sustained comminuted and angulated intertrochanteric right femur fracture.she underwent IM nailing on 05/10/2018 per Dr. Victorino December. Weightbearing as tolerated.  Hospital course complicated by pain management and acute blood loss anemia, hemoglobin 9.7-10.7 and monitored. Maintained on aspirin for DVT prophylaxis.Therapy evaluations completed and patient was admitted for a comprehensive rehabilitation program.  Patient transferred to CIR on 05/14/2018 .    Patient currently requires mod with basic self-care skills secondary to muscle weakness, decreased cardiorespiratoy endurance and decreased sitting balance, decreased standing balance, decreased postural control, decreased balance strategies and difficulty maintaining precautions.  Prior to hospitalization, patient could complete ADLs/IADLs with modified independent .  Patient will benefit from skilled intervention to decrease level of assist with basic self-care skills, increase independence with basic self-care skills and increase level of independence with iADL prior to discharge home independently.  Anticipate patient will require intermittent supervision and follow up home health.  OT - End of Session Activity Tolerance: Tolerates 10 - 20 min activity with multiple rests Endurance Deficit: Yes Endurance Deficit Description: Requires rest breaks throughout ADL bathing/dressing task OT Assessment Rehab Potential (ACUTE ONLY): Excellent OT Barriers to Discharge: Home environment access/layout OT Barriers to Discharge Comments: House will have limited access from w/c level if needed OT Patient demonstrates  impairments in the following area(s): Balance;Pain;Safety;Endurance OT Basic  ADL's Functional Problem(s): Grooming;Bathing;Dressing;Toileting OT Transfers Functional Problem(s): Toilet;Tub/Shower OT Additional Impairment(s): None OT Plan OT Intensity: Minimum of 1-2 x/day, 45 to 90 minutes OT Frequency: 5 out of 7 days OT Duration/Estimated Length of Stay: 2 weeks OT Treatment/Interventions: Balance/vestibular training;Discharge planning;Community reintegration;DME/adaptive equipment instruction;Functional mobility training;Neuromuscular re-education;Psychosocial support;Patient/family education;Self Care/advanced ADL retraining;Therapeutic Exercise;UE/LE Coordination activities;Wheelchair propulsion/positioning;Skin care/wound managment;Therapeutic Activities;UE/LE Strength taining/ROM OT Self Feeding Anticipated Outcome(s): Indep OT Basic Self-Care Anticipated Outcome(s): Supervision-mod I OT Toileting Anticipated Outcome(s): Mod I OT Bathroom Transfers Anticipated Outcome(s): Supervision-mod I OT Recommendation Recommendations for Other Services: Therapeutic Recreation consult Patient destination: Home Follow Up Recommendations: Home health OT Equipment Recommended: To be determined   Skilled Therapeutic Intervention Pt seen for OT eval and ADL bathing/dressing session. Pt in supine upon arrival, meal tray having just arrived and pt desiring to eat lunch. She transferred to EOB with mod A using hospital bed functions and assist for management of R LE off EOB. She sat EOB to eat lunch while PLOF and education completed. Pt denying pain at rest, reports being pre-medicated prior to tx session. Discussed timing of medications in prep for therapy sessions as pain limiting this AM during PT session.  She completed bathing/dressing routine seated EOB, no personal clothing present therefore donning hospital gown. Pt requires max A for LB bathing/dressing, will benefit from education with AE. She completed stand pivot transfer to BSC, mod A to stand from slightly elevated EOB  and min A to transfer to BSC with VCs for technique. Steadying assist for toileting task. She then completed stand pivot back to recliner in same manner as described above. Pt left seated in recliner at end of session, all needs in reach. Education provided throughout session regarding role of OT, POC, OT goals, continuum of care, pain management, and d/c planning.   OT Evaluation Precautions/Restrictions  Precautions Precautions: Fall Restrictions Weight Bearing Restrictions: Yes RLE Weight Bearing: Weight bearing as tolerated General Chart Reviewed: Yes Home Living/Prior Functioning Home Living Family/patient expects to be discharged to:: Private residence Living Arrangements: Alone Available Help at Discharge: Family, Available PRN/intermittently(Can hire assist PRN) Type of Home: House Home Access: Stairs to enter Entrance Stairs-Number of Steps: 4 Entrance Stairs-Rails: Right, Left, Can reach both Home Layout: One level Bathroom Shower/Tub: Walk-in shower Bathroom Toilet: Standard Bathroom Accessibility: Yes Additional Comments: Does not believe w/c will easily fit within home  Lives With: Alone IADL History Homemaking Responsibilities: Yes Current License: Yes Mode of Transportation: Car Occupation: Retired Prior Function Level of Independence: Independent with gait, Independent with transfers, Independent with homemaking with ambulation  Able to Take Stairs?: Yes Driving: Yes Vocation: Retired Comments: uses a SPC for community, does ADL/IADL but does have a cleaner that comes, enjoys theater Vision Baseline Vision/History: Wears glasses Wears Glasses: At all times Patient Visual Report: No change from baseline Vision Assessment?: No apparent visual deficits Perception  Perception: Within Functional Limits Praxis Praxis: Intact Cognition Overall Cognitive Status: Within Functional Limits for tasks assessed Arousal/Alertness: Awake/alert Orientation Level:  Person;Place;Situation Person: Oriented Place: Oriented Situation: Oriented Year: 2020 Month: April Day of Week: Incorrect Memory: Appears intact Immediate Memory Recall: Sock;Blue;Bed Memory Recall: Sock;Blue;Bed Memory Recall Sock: Without Cue Memory Recall Blue: Without Cue Memory Recall Bed: Without Cue Attention: Focused Focused Attention: Appears intact Awareness: Appears intact Problem Solving: Appears intact Safety/Judgment: Appears intact Sensation Sensation Light Touch: Appears Intact(Reports neuropathy in B outside foot) Proprioception: Appears Intact Coordination Gross Motor Movements are Fluid and   Coordinated: No Fine Motor Movements are Fluid and Coordinated: Yes Coordination and Movement Description: impaired by pain and generalized weakness Motor  Motor Motor: Within Functional Limits Motor - Skilled Clinical Observations: generalized weakness Trunk/Postural Assessment  Cervical Assessment Cervical Assessment: Exceptions to WFL(Forward head) Thoracic Assessment Thoracic Assessment: Exceptions to WFL(Kyphotic Rounded shoulders) Lumbar Assessment Lumbar Assessment: Exceptions to WFL(Posterior pelvic tilt) Postural Control Postural Control: Deficits on evaluation Righting Reactions: delayed  Balance Balance Balance Assessed: Yes Static Sitting Balance Static Sitting - Balance Support: No upper extremity supported;Feet supported Static Sitting - Level of Assistance: 6: Modified independent (Device/Increase time) Dynamic Sitting Balance Dynamic Sitting - Balance Support: No upper extremity supported;Feet supported;During functional activity Dynamic Sitting - Level of Assistance: 5: Stand by assistance;4: Min assist Sitting balance - Comments: Sitting EOB to complete bathing task Static Standing Balance Static Standing - Balance Support: During functional activity;Right upper extremity supported;Left upper extremity supported Static Standing - Level of  Assistance: 4: Min assist;3: Mod assist Static Standing - Comment/# of Minutes: Standing at [employer] Dynamic Standing Balance Dynamic Standing - Balance Support: During functional activity;Right upper extremity supported;Left upper extremity supported Dynamic Standing - Level of Assistance: 4: Min assist;3: Mod assist Dynamic Standing - Comments: Standing at RW to complete toileting task Extremity/Trunk Assessment RUE Assessment RUE Assessment: Within Functional Limits LUE Assessment LUE Assessment: Within Functional Limits     Refer to Care Plan for Long Term Goals  Recommendations for other services: Neuropsych and Surveyor, mining group, Stress management and Outing/community reintegration   Discharge Criteria: Patient will be discharged from OT if patient refuses treatment 3 consecutive times without medical reason, if treatment goals not met, if there is a change in medical status, if patient makes no progress towards goals or if patient is discharged from hospital.  The above assessment, treatment plan, treatment alternatives and goals were discussed and mutually agreed upon: by patient  Tate Zagal L 05/15/2018, 3:46 PM

## 2018-05-15 NOTE — Progress Notes (Signed)
Right lower extremity venous duplex completed. Note: order received was for bilateral examination, however patient refused examination of the left lower extremity due to the LLE being asymptomatic.  Refer to "CV Proc" under chart review to view preliminary results.  05/15/2018 4:14 PM Maudry Mayhew, MHA, RVT, RDCS, RDMS

## 2018-05-15 NOTE — Progress Notes (Signed)
[facility] PHYSICAL MEDICINE & REHABILITATION PROGRESS NOTE   Subjective/Complaints: Had a fair night. Had some difficulty sleeping due to pain in her right hip and right knee which is arthritic  ROS: Patient denies fever, rash, sore throat, blurred vision, nausea, vomiting, diarrhea, cough, shortness of breath or chest pain, joint or back pain, headache, or mood change.    Objective:   No results found. Recent Labs    05/13/18 1240 05/15/18 0636  WBC 9.9 7.6  HGB 10.7* 10.4*  HCT 32.9* 32.4*  PLT 165 197   Recent Labs    05/13/18 1240 05/15/18 0636  NA 134* 135  K 4.0 3.9  CL 97* 101  CO2 25 24  GLUCOSE 198* 129*  BUN 16 17  CREATININE 0.96 0.97  CALCIUM 9.3 8.8*    Intake/Output Summary (Last 24 hours) at 05/15/2018 0904 Last data filed at 05/14/2018 1811 Gross per 24 hour  Intake 180 ml  Output -  Net 180 ml     Physical Exam: Vital Signs Blood pressure (!) 155/53, pulse (!) 59, temperature 98.1 F (36.7 C), temperature source Oral, resp. rate 18, last menstrual period 01/15/1982, SpO2 96 %. Constitutional: No distress . Vital signs reviewed. HEENT: EOMI, oral membranes moist Neck: supple Cardiovascular: RRR without murmur. No JVD    Respiratory: CTA Bilaterally without wheezes or rales. Normal effort    GI: BS +, non-tender, non-distended  Musculoskeletal:     Comments: right hip with swelling, tender.  -right knee tender to palp. No gross effusion, did not range d/t hip Neurological: She is alert.  Good insight and awareness. Motor: Bilateral upper extremities: 5/5 proximal distal Left lower extremity: Hip flexion, knee extension 4 medicine/5, ankle dorsiflexion 4+/5 Right lower extremity: Hip flexion 2-/5, ankle dorsiflexion/APF 4/5  Skin:  Hip incision clean and dry with dressings in place  Psychiatric: pleasant and cooperative    Assessment/Plan: 1. Functional deficits secondary to right femoral neck fracture, endstage OA right knee which  require 3+ hours per day of interdisciplinary therapy in a comprehensive inpatient rehab setting.  Physiatrist is providing close team supervision and 24 hour management of active medical problems listed below.  Physiatrist and rehab team continue to assess barriers to discharge/monitor patient progress toward functional and medical goals  Care Tool:  Bathing              Bathing assist       Upper Body Dressing/Undressing Upper body dressing        Upper body assist      Lower Body Dressing/Undressing Lower body dressing            Lower body assist       Toileting Toileting    Toileting assist Assist for toileting: Minimal Assistance - Patient > 75%     Transfers Chair/bed transfer  Transfers assist     Chair/bed transfer assist level: Minimal Assistance - Patient > 75%     Locomotion Ambulation   Ambulation assist              Walk 10 feet activity   Assist           Walk 50 feet activity   Assist           Walk 150 feet activity   Assist           Walk 10 feet on uneven surface  activity   Assist           Wheelchair  Assist               Wheelchair 50 feet with 2 turns activity    Assist            Wheelchair 150 feet activity     Assist           Medical Problem List and Plan: 1.  Decreased functional mobility secondary to comminuted angulated intertrochanteric right femur fracture. Status post IM nailing 05/10/2018. Weightbearing as tolerated.             -Patient is beginning CIR therapies today including PT and OT  2.  Antithrombotics: -DVT/anticoagulation:  SCDs.  Vascular Doppler ordered             -antiplatelet therapy: Aspirin 81 mg daily 3. Pain Management: Hydrocodone and Robaxin as needed.   -add voltaren gel to right knee  -ice  -consider joint injection 4. Mood:  Provide emotional support             -antipsychotic agents: N/A 5. Neuropsych: This  patient is capable of making decisions on her own behalf. 6. Skin/Wound Care:  continue current dressings to right hip 7. Fluids/Electrolytes/Nutrition:  encourage po  -I personally reviewed the patient's labs today.   8. Acute blood loss anemia. hgb up to 10.4  -no bleeding 9. Hypertension. Norvasc 10 mg daily, Cozaar 100 mg daily--change cozaar to HS per home regimen 10. Hypothyroidism. Synthroid 11.History of ulcerative colitis as well as colon cancer with resection. Continue Azulfidine 1000 mg twice a day 12. Diet-controlled diabetes mellitus. Blood sugar checks discontinued. Continue diabetic diet. Fair control at present 13. Constipation. Laxative assistance  14.  Morbid obesity: Encouraged weight loss    LOS: 1 days A FACE TO FACE EVALUATION WAS PERFORMED  Meredith Staggers 05/15/2018, 9:04 AM

## 2018-05-15 NOTE — Progress Notes (Signed)
Physical Therapy Session Note  Patient Details  Name: Jenna Chambers MRN: [id number] Date of Birth: 09-Nov-1931  Today's Date: 05/15/2018 PT Individual Time: 1002-1100 PT Individual Time Calculation (min): 58 min   Short Term Goals: Week 1:     Skilled Therapeutic Interventions/Progress Updates:    Patient received in bed, very pleasant and willing to work with therapy but reporting high pain levels this morning, RN made aware and session performed to patient tolerance today. Reports high levels of fatigue this morning following evaluation performed earlier, and required ModA for bed mobility and ModA for stand-pivot transfer to [place]. She was transported [place] to the practice car via [facility] for energy conservation, and able to perform transfer into car with [place] but required MaxA for transfer back to [facility] due to low seat of car along with high pain levels. She was then transported back to her room totalA in [facility] and transferred back to bed with [place]. Focused the remainder of the session on functional LE exercises in bed, including quad sets, heel slides, SLRs, supine hip ABD, ankle pumps, SAQs, and isometric hip extensions. Extended time required for all activities this session due to high pain and fatigue levels. She was left in bed with all needs met and bed alarm active this morning.   Therapy Documentation Precautions:  Precautions Precautions: Fall Restrictions Weight Bearing Restrictions: Yes RLE Weight Bearing: Weight bearing as tolerated   Pain: Pain Assessment Pain Scale: 0-10 Pain Score: 7  Pain Type: Acute pain;Surgical pain Pain Location: Leg Pain Orientation: Right Pain Descriptors / Indicators: Aching;Heaviness;Discomfort;Sharp Pain Frequency: Intermittent Pain Onset: On-going Patients Stated Pain Goal: 0 Pain Intervention(s): RN made aware;Repositioned;Emotional support Multiple Pain Sites: No    Therapy/Group: Individual Therapy   Deniece Ree PT, DPT,  CBIS  Supplemental Physical Therapist [facility]    Pager [phone] Acute Rehab Office [phone]    05/15/2018, 12:37 PM

## 2018-05-15 NOTE — Plan of Care (Signed)
  Problem: RH SKIN INTEGRITY Goal: RH STG ABLE TO PERFORM INCISION/WOUND CARE W/ASSISTANCE Description STG Able To Perform Incision/Wound Care With [employer].  Outcome: Progressing

## 2018-05-15 NOTE — Evaluation (Signed)
Physical Therapy Assessment and Plan  Patient Details  Name: Jenna Chambers MRN: [id number] Date of Birth: December 26, 1931  PT Diagnosis: Abnormality of gait, Difficulty walking and Pain in joint Rehab Potential: Good ELOS: 12-14 days   Today's Date: 05/15/2018 PT Individual Time: 0800-0900 PT Individual Time Calculation (min): 60 min    Problem List:  Patient Active Problem List   Diagnosis Date Noted  . Displaced fracture of right femoral neck ([place]) 05/14/2018  . Essential hypertension   . Hypothyroidism   . Acute blood loss anemia 05/13/2018  . Obesity, Class III, BMI 40-49.9 (morbid obesity) ([place]) 05/13/2018  . Hip fracture ([place]) 05/10/2018  . Colon cancer ([place]) 02/27/2013  . Essential hypertension, benign 02/27/2013  . Glaucoma 02/27/2013  . Ulcerative colitis ([place]) 02/27/2013  . Diabetes [facility])     Past Medical History:  Past Medical History:  Diagnosis Date  . Colon cancer ([place])    resecetion  . Diabetes ([place])    Type 2  . Glaucoma    --both eyes per patient getting worse  . Gout   . Hypertension   . Osteopenia   . Sleep apnea    no CPAP  . Ulcerative colitis [facility])    Past Surgical History:  Past Surgical History:  Procedure Laterality Date  . BILATERAL SALPINGOOPHORECTOMY  1993   serous cystadenoma  . CATARACT EXTRACTION  2006, 2008   gluacoma  . EXPLORATORY LAPAROTOMY  1996   incarcerated hernia  . INTRAMEDULLARY (IM) NAIL INTERTROCHANTERIC Right 05/10/2018   Procedure: INTRAMEDULLARY (IM) NAIL INTERTROCHANTRIC;  Surgeon: Jenna Stairs, MD;  Location: [facility];  Service: Orthopedics;  Laterality: Right;  . PARTIAL COLECTOMY  1991  . TOTAL KNEE ARTHROPLASTY Left 2010    Assessment & Plan Clinical Impression:  Jenna Chambers is an 85 year old right-handed female with history of colon cancer with resection, diet controlled diabetes mellitus,,hypertension, ulcerated colitis.  Per chart review and patient, she lives alone.  One level home 4 steps to entry.  Independent using a straight point cane for community ambulation. Independent with ADLs. Presented on 05/10/2018 after a fall.  She reportedly fell backward trying to step on a bug.  No loss of consciousness. Sustained comminuted and angulated intertrochanteric right femur fracture.she underwent IM nailing on 05/10/2018 per Dr. Victorino December. Weightbearing as tolerated.  Hospital course complicated by pain management and acute blood loss anemia, hemoglobin 9.7-10.7 and monitored. Maintained on aspirin for DVT prophylaxis.Therapy evaluations completed and patient was admitted for a comprehensive rehabilitation program. Patient transferred to CIR on 05/14/2018 .   Patient currently requires mod with mobility secondary to muscle weakness and decreased standing balance, decreased postural control and decreased balance strategies.  Prior to hospitalization, patient was independent  with mobility and lived with Alone in a House home.  Home access is 4Stairs to enter.  Patient will benefit from skilled PT intervention to maximize safe functional mobility, minimize fall risk and decrease caregiver burden for planned discharge home with intermittent assist.  Anticipate patient will benefit from follow up [facility] at discharge.  PT - End of Session Activity Tolerance: Tolerates 30+ min activity with multiple rests Endurance Deficit: Yes Endurance Deficit Description: limited by pain PT Assessment Rehab Potential (ACUTE/IP ONLY): Good PT Barriers to Discharge: Decreased caregiver support;Medical stability;Home environment access/layout PT Plan PT Intensity: Minimum of 1-2 x/day ,45 to 90 minutes PT Frequency: 5 out of 7 days PT Duration Estimated Length of Stay: 12-14 days PT Treatment/Interventions: Ambulation/gait training;Balance/vestibular training;Community reintegration;Discharge planning;Disease management/prevention;DME/adaptive equipment  instruction;Functional mobility training;Pain management;Patient/family  education;Stair training;Therapeutic Activities;Therapeutic Exercise;UE/LE Strength taining/ROM;UE/LE Coordination activities;Wheelchair propulsion/positioning PT Recommendation Recommendations for Other Services: Therapeutic Recreation consult Therapeutic Recreation Interventions: Stress management Follow Up Recommendations: Home health PT Patient destination: Home Equipment Recommended: Wheelchair (measurements);Wheelchair cushion (measurements) Equipment Details: manual w/c, TBD  Skilled Therapeutic Intervention Evaluation completed (see details above and below) with education on PT POC and goals and individual treatment initiated with focus on functional transfer and gait assessment. Pt received supine in bed, agreeable to PT eval. See pain details below. Supine to sit with min A for RLE management. Sit to stand with mod A to RW. Stand pivot transfer bed to w/c with RW and mod A. Ambulation 2 x 5 ft with RW and mod A, demonstration and v/c for unweighting RLE with gait due to pain. Pt has fair tolerance for gait and reports feeling lightheaded in standing, symptoms resolve once seated. Pt left seated in w/c in room at sink with needs in reach.  PT Evaluation Precautions/Restrictions Precautions Precautions: Fall Restrictions Weight Bearing Restrictions: Yes RLE Weight Bearing: Weight bearing as tolerated Pain Pain Assessment Pain Scale: 0-10 Pain Score: 5  Pain Type: Acute pain;Surgical pain Pain Location: Leg Pain Orientation: Right Pain Descriptors / Indicators: Aching Pain Frequency: Intermittent Pain Onset: On-going Patients Stated Pain Goal: 0 Pain Intervention(s): Medication (See eMAR) Multiple Pain Sites: No Home Living/Prior Functioning Home Living Family/patient expects to be discharged to:: Private residence Living Arrangements: Alone Available Help at Discharge: Family;Available PRN/intermittently(can hire assist as recommended) Type of Home: House Home Access:  Chambers to enter [employer] of Steps: 4 Entrance Chambers-Rails: Right;Left;Can reach both Home Layout: One level Bathroom Shower/Tub: Multimedia programmer: Standard Bathroom Accessibility: Yes Additional Comments: Does not believe w/c will easily fit within home  Lives With: Alone Prior Function Level of Independence: Independent with gait;Independent with transfers  Able to Take Chambers?: Yes Driving: Yes Vocation: Retired Comments: uses a [employer] for community, does ADL/IADL but does have a cleaner that comes, enjoys theater Vision/Perception  Vision - History Baseline Vision: Wears glasses all the time Perception Perception: Within Functional Limits Praxis Praxis: Intact  Cognition Overall Cognitive Status: Within Functional Limits for tasks assessed Arousal/Alertness: Awake/alert Orientation Level: Oriented X4 Attention: Focused Focused Attention: Appears intact Memory: Appears intact Awareness: Appears intact Problem Solving: Appears intact Safety/Judgment: Appears intact Sensation Sensation Light Touch: Appears Intact(does report neuropathy in BLE) Proprioception: Appears Intact Coordination Gross Motor Movements are Fluid and Coordinated: No Fine Motor Movements are Fluid and Coordinated: Yes Coordination and Movement Description: impaired by pain and generalized weakness Heel Shin Test: unable to assess 2/2 pain Motor  Motor Motor: Within Functional Limits Motor - Skilled Clinical Observations: generalized weakness  Mobility Bed Mobility Bed Mobility: Rolling Right;Rolling Left;Supine to Sit;Sit to Supine Rolling Right: Minimal Assistance - Patient > 75% Rolling Left: Minimal Assistance - Patient > 75% Supine to Sit: Minimal Assistance - Patient > 75% Sit to Supine: Minimal Assistance - Patient > 75% Transfers Transfers: Sit to [employer] Transfers Sit to Stand: Moderate Assistance - Patient 50-74% Stand Pivot Transfers: Moderate  Assistance - Patient 50 - 74% Stand Pivot Transfer Details: Verbal cues for technique;Verbal cues for precautions/safety;Verbal cues for safe use of DME/AE;Manual facilitation for weight shifting Transfer (Assistive device): Rolling walker Locomotion  Gait Assistive device: Rolling walker Gait Gait Pattern: Impaired(antalgic, step-to, dec stance time on RLE) Gait velocity: Decreased Chambers / Additional Locomotion Chambers: No Wheelchair Mobility Wheelchair Mobility: No  Trunk/Postural Assessment  Cervical Assessment  Cervical Assessment: Exceptions to WFL(forward head) Thoracic Assessment Thoracic Assessment: Exceptions to WFL(rounded shoulders) Lumbar Assessment Lumbar Assessment: Exceptions to WFL(posterior pelvic tilt) Postural Control Postural Control: Deficits on evaluation Righting Reactions: delayed  Balance Balance Balance Assessed: Yes Static Sitting Balance Static Sitting - Balance Support: No upper extremity supported;Feet supported Static Sitting - Level of Assistance: 5: Stand by assistance Dynamic Sitting Balance Dynamic Sitting - Balance Support: No upper extremity supported;Feet supported;During functional activity Dynamic Sitting - Level of Assistance: 4: Min assist Sitting balance - Comments: Sitting EOB to complete bathing task Static Standing Balance Static Standing - Balance Support: Bilateral upper extremity supported;During functional activity Static Standing - Level of Assistance: 4: Min assist Static Standing - Comment/# of Minutes: Standing at [employer] Dynamic Standing Balance Dynamic Standing - Balance Support: Bilateral upper extremity supported;During functional activity Dynamic Standing - Level of Assistance: 3: Mod assist Dynamic Standing - Comments: Standing at RW to complete toileting task Extremity Assessment  RUE Assessment RUE Assessment: Within Functional Limits LUE Assessment LUE Assessment: Within Functional Limits RLE Assessment RLE  Assessment: Exceptions to [facility] Passive Range of Motion (PROM) Comments: impaired 2/2 pain General Strength Comments: impaired, see below RLE Strength Right Hip Flexion: 2-/5 Right Knee Flexion: 3/5 Right Knee Extension: 2+/5 Right Ankle Dorsiflexion: 4/5 LLE Assessment LLE Assessment: Exceptions to [facility] Passive Range of Motion (PROM) Comments: WFL General Strength Comments: impaired, see below LLE Strength Left Hip Flexion: 3/5 Left Knee Flexion: 4/5 Left Knee Extension: 4/5 Left Ankle Dorsiflexion: 4/5    Refer to Care Plan for Long Term Goals  Recommendations for other services: Therapeutic Recreation  Stress management  Discharge Criteria: Patient will be discharged from PT if patient refuses treatment 3 consecutive times without medical reason, if treatment goals not met, if there is a change in medical status, if patient makes no progress towards goals or if patient is discharged from hospital.  The above assessment, treatment plan, treatment alternatives and goals were discussed and mutually agreed upon: by patient   Excell Seltzer, PT, DPT 05/15/2018, 3:46 PM

## 2018-05-15 NOTE — Progress Notes (Signed)
Inpatient Rehabilitation  Patient information reviewed and entered into eRehab system by Lawrie Tunks M. Caidyn Henricksen, M.A., CCC/SLP, PPS Coordinator.  Information including medical coding, functional ability and quality indicators will be reviewed and updated through discharge.    

## 2018-05-16 ENCOUNTER — Inpatient Hospital Stay (HOSPITAL_COMMUNITY): Payer: Medicare Other | Admitting: Occupational Therapy

## 2018-05-16 ENCOUNTER — Inpatient Hospital Stay (HOSPITAL_COMMUNITY): Payer: Medicare Other | Admitting: Physical Therapy

## 2018-05-16 ENCOUNTER — Encounter (HOSPITAL_COMMUNITY): Payer: Self-pay | Admitting: *Deleted

## 2018-05-16 LAB — GLUCOSE, CAPILLARY: Glucose-Capillary: 126 mg/dL — ABNORMAL HIGH (ref 70–99)

## 2018-05-16 MED ORDER — LOSARTAN POTASSIUM 50 MG PO TABS
100.0000 mg | ORAL_TABLET | Freq: Every day | ORAL | Status: DC
  Administered 2018-05-16 – 2018-05-29 (×14): 100 mg via ORAL
  Filled 2018-05-16 (×14): qty 2

## 2018-05-16 MED ORDER — CYCLOBENZAPRINE HCL 5 MG PO TABS
5.0000 mg | ORAL_TABLET | Freq: Three times a day (TID) | ORAL | Status: DC | PRN
  Administered 2018-05-19: 5 mg via ORAL
  Filled 2018-05-16 (×2): qty 1

## 2018-05-16 MED ORDER — ACETAMINOPHEN 500 MG PO TABS
500.0000 mg | ORAL_TABLET | Freq: Three times a day (TID) | ORAL | Status: DC
  Administered 2018-05-16 – 2018-05-30 (×41): 500 mg via ORAL
  Filled 2018-05-16 (×42): qty 1

## 2018-05-16 NOTE — Progress Notes (Signed)
Occupational Therapy Session Note  Patient Details  Name: Jenna Chambers MRN: [id number] Date of Birth: [birth year]  Today's Date: 05/16/2018 OT Individual Time: 8299-3716 and 9678-9381 OT Individual Time Calculation (min): 55 min and 75 min   Short Term Goals: Week 1:  OT Short Term Goal 1 (Week 1): Pt will don pants with steadying assist using AE PRN OT Short Term Goal 2 (Week 1): Pt will consistently complete stand pivot transfer with min A using LRAD in prep for functional task OT Short Term Goal 3 (Week 1): Pt will stand to complete 1 grooming task with CGA to increase functional standing balance/endurance  Skilled Therapeutic Interventions/Progress Updates:    Session One: Pt seen for OT session focusing on functional mboility and activity tolerance, pt wishing to complete bathing/dressing at 2nd OT session. Pt awake in supine upon arrival, agreeable to tx session and denying pain at rest. Pt reports being pre-medicated prior to tx session.  She transferred ot sitting EOB with min A, using hospital bed functions and assist to de-weight R LE.  Throughout session, pt completed stand pivot transfers using RW. Initially required mod A to power into standing, however, progressing to CGA throughout session. She completed grooming tasks at sink, sat in w/c to wash face with set-up. She stood with steadying assist to complete oral care with CGA, tolerating ~2 minutes in standing before requiring seated rest break.  Completed x4 in total stand pivot transfer w/c<> BSC. BSC lowered on subsequent trials attempting to find height from which pt could most comfortably stand while also having feet reach the ground for comfort. Mod A to stand from lowest level, however, feet still not reaching ground. Therefore raised BSC height and provided pt with step stool for footrest when completing toileting task. Pt required extended seated rest breaks btwn each trial. Noted with limited WBing through R LE during  transfers. Education and demonstration provided reagarding use of reacher and sock aid for LB dressing tasks. Following demonstration, pt return demonstrated ability to don/doff socks. She stated she preferred to wear slide on shoes without socks and therefore at end of session doffed socks and remained in shoes only. Pt left seated in w/c at end of session with all needs in reach and MD present completing AM Ashleigh Luckow.    Session Two: Pt seen for OT ADL bathing/dressing session. Pt in aupine upon arrival, agreeableto tx session, desiring to alert RN of time for PRN pain meds. She  Transferred tositting EOB with min Ausing hospital bed functions. Mod Ato stand from EOB and complete stand pivot transfer to w/c using RW. She transferred w/c>standard toilet with use of grab bars. Mod A to stand from w/c and max A to stand from standard height toilet. Required total A for hygiene from standing position.  Stand pivot from w/c > tub bench in shower. Episode of R knee buckling and requriing mod-max A to safely transition onto tub bench. She bathed with supervision seated on tub transfer bench. Provided with LH sponge and education provided regarding use for LB bathing.  MOd A stand pviot to exit shower with VCs for technique and deep breathing as pt with increased anxiety during transfer.  She dressed seated in w/c at sink. Set-up to thread on gown, requiring assist to stand and pull it down and manage brief.  Completed UE strengthening exercises as follows using light resistance (orange) theraband. VCs and demonstration provided for proper form and technique. Pt provided with handout of exercises as part  of HEP.  x10 Shoulder ABduction, bicep curls, diagonal up, and diagonal down. Pt desiring to stay sitting up in w/c at end of session, all needs in reach. Ice applied to hip for comfort.   Therapy Documentation Precautions:  Precautions Precautions: Fall Restrictions Weight Bearing Restrictions: Yes RLE  Weight Bearing: Weight bearing as tolerated Pain: Pain Assessment Pain Scale: 0-10 Pain Score: 7  Pain Type: Acute pain;Surgical pain Pain Location: Hip Pain Orientation: Right Pain Radiating Towards: Incision sites Pain Descriptors / Indicators: Aching;Discomfort Pain Frequency: Intermittent Pain Onset: Gradual Patients Stated Pain Goal: 2 Pain Intervention(s): Medication (See eMAR), RN made aware, shower, repositioned, ice applied Multiple Pain Sites: No   Therapy/Group: Individual Therapy  Jane Broughton L 05/16/2018, 6:50 AM

## 2018-05-16 NOTE — IPOC Note (Signed)
Overall Plan of Care [facility]) Patient Details Name: FELESIA STAHLECKER MRN: [id number] DOB: April 18, 1931  Admitting Diagnosis: <principal problem not specified>  Hospital Problems: Active Problems:   Displaced fracture of right femoral neck (HCC)     Functional Problem List: Nursing Pain, Skin Integrity, Bowel, Endurance, Motor  PT Balance, Endurance, Pain, Safety  OT Balance, Pain, Safety, Endurance  SLP    TR         Basic ADL's: OT Grooming, Bathing, Dressing, Toileting     Advanced  ADL's: OT       Transfers: PT Bed Mobility, Bed to Chair, Car, [employer], Futures trader, Metallurgist: PT Ambulation, Emergency planning/management officer, Stairs     Additional Impairments: OT None  SLP        TR      Anticipated Outcomes Item Anticipated Outcome  Self Feeding Indep  Swallowing      Basic self-care  Supervision-mod I  Toileting  Mod I   Bathroom Transfers Supervision-mod I  Bowel/Bladder  Pt will manage bowel and bladder with mod I assist   Transfers  mod I  Locomotion  mod I at w/c level  Communication     Cognition     Pain  Pt will manage pain at 3 or less on a scale of 0-10.   Safety/Judgment  Pt will remain free of falls with injury while in rehab with min assist    Therapy Plan: PT Intensity: Minimum of 1-2 x/day ,45 to 90 minutes PT Frequency: 5 out of 7 days PT Duration Estimated Length of Stay: 12-14 days OT Intensity: Minimum of 1-2 x/day, 45 to 90 minutes OT Frequency: 5 out of 7 days OT Duration/Estimated Length of Stay: 2 weeks     Due to the current state of emergency, patients may not be receiving their 3-hours of Medicare-mandated therapy.   Team Interventions: Nursing Interventions Patient/Family Education, Bowel Management, Skin Care/Wound Management, Pain Management, Discharge Planning  PT interventions Ambulation/gait training, Balance/vestibular training, Community reintegration, Discharge planning, Disease  management/prevention, DME/adaptive equipment instruction, Functional mobility training, Pain management, Patient/family education, Stair training, Therapeutic Activities, Therapeutic Exercise, UE/LE Strength taining/ROM, UE/LE Coordination activities, Wheelchair propulsion/positioning  OT Interventions Training and development officer, Discharge planning, Community reintegration, Engineer, drilling, Functional mobility training, Neuromuscular re-education, Psychosocial support, Patient/family education, Self Care/advanced ADL retraining, Therapeutic Exercise, UE/LE Coordination activities, Wheelchair propulsion/positioning, Skin care/wound managment, Therapeutic Activities, UE/LE Strength taining/ROM  SLP Interventions    TR Interventions    SW/CM Interventions Discharge Planning, Psychosocial Support, Patient/Family Education   Barriers to Discharge MD  Medical stability  Nursing Decreased caregiver support    PT Decreased caregiver support, Medical stability, Home environment access/layout    OT Home environment access/layout House will have limited access from w/c level if needed  SLP      SW       Team Discharge Planning: Destination: PT-Home ,OT- Home , SLP-  Projected Follow-up: PT-Home health PT, OT-  Home health OT, SLP-  Projected Equipment Needs: PT-Wheelchair (measurements), Wheelchair cushion (measurements), OT- To be determined, SLP-  Equipment Details: PT-manual w/c, TBD, OT-  Patient/family involved in discharge planning: PT- Patient,  OT-Patient, SLP-   MD ELOS: 12-14 days Medical Rehab Prognosis:  Excellent Assessment: The patient has been admitted for CIR therapies with the diagnosis of right femur fracture. The team will be addressing functional mobility, strength, stamina, balance, safety, adaptive techniques and equipment, self-care, bowel and bladder mgt, patient and caregiver education, pain mgt, wound  care, community reentry, ego support. Goals have  been set at mod I for mobility and basic self-care tasks.   Due to the current state of emergency, patients may not be receiving their 3 hours per day of Medicare-mandated therapy.    Meredith Staggers, MD, FAAPMR      See Team Conference Notes for weekly updates to the plan of care

## 2018-05-16 NOTE — Progress Notes (Signed)
Inpatient [facility] Individual Statement of Services  Patient Name:  Jenna Chambers  Date:  05/16/2018  Welcome to the [facility].  Our goal is to provide you with an individualized program based on your diagnosis and situation, designed to meet your specific needs.  With this comprehensive rehabilitation program, you will be expected to participate in at least 3 hours of rehabilitation therapies Monday-Friday, with modified therapy programming on the weekends.  Your rehabilitation program will include the following services:  Physical Therapy (PT), Occupational Therapy (OT), 24 hour per day rehabilitation nursing, Case Management (Social Worker), Rehabilitation Medicine, Nutrition Services and Pharmacy Services  Weekly team conferences will be held on Tuesdays to discuss your progress.  Your Social Worker will talk with you frequently to get your input and to update you on team discussions.  Team conferences with you and your family in attendance may also be held.  Expected length of stay:   Overall anticipated outcome: 12 to 14 days  Depending on your progress and recovery, your program may change. Your Social Worker will coordinate services and will keep you informed of any changes. Your Social Worker's name and contact numbers are listed  below.  The following services may also be recommended but are not provided by the [facility] will be made to provide these services after discharge if needed.  Arrangements include referral to agencies that provide these services.  Your insurance has been verified to be:  [employer] Your primary doctor is:  Dr. Shirline Frees  Pertinent information will be shared with your doctor and your insurance company.  Social Worker:  Alfonse Alpers, LCSW  [phone] or ([phone]  Information discussed with and copy given to patient by: Trey Sailors, 05/16/2018, 10:34 PM

## 2018-05-16 NOTE — Progress Notes (Signed)
Physical Therapy Session Note  Patient Details  Name: Jenna Chambers MRN: [id number] Date of Birth: Feb 28, 1931  Today's Date: 05/16/2018 PT Individual Time: 0915-1030 PT Individual Time Calculation (min): 75 min   Short Term Goals: Week 1:  PT Short Term Goal 1 (Week 1): Pt will perform least restrictive transfers with min A consistently PT Short Term Goal 2 (Week 1): Pt will initiate stair training PT Short Term Goal 3 (Week 1): Pt will ambulate x 10 ft with LRAD and min A  Skilled Therapeutic Interventions/Progress Updates:    Pt received seated in w/c in room, agreeable to PT session. See pain details below. Manual w/c propulsion x 100 ft with use of BUE and Supervision before onset of fatigue. Sit to stand with mod A from w/c to RW. Ambulation x 7' with RW and mod A, v/c for unweighting RLE with use of BUE on RW. Pt continues to exhibit poor tolerance for WBing on RLE in stance. Standing RLE 1" step taps 2 x 10 reps for RLE strengthening. Attempt to perform LLE lift but pt unable to tolerate standing on RLE even with use of RW and BUE to unweight LE. Seated BLE therex with AAROM for RLE: marches, LAQ, heel slides, heel/toe raises, hip add squeeze. Pt requests to return to bed at end of session. Stand pivot transfer w/c to bed with RW and mod A. Sit to supine min A for RLE management. Pt left semi-reclined in bed with needs in reach, bed alarm in place, ice pack to R hip at end of session.  Therapy Documentation Precautions:  Precautions Precautions: Fall Restrictions Weight Bearing Restrictions: Yes RLE Weight Bearing: Weight bearing as tolerated Pain: Pain Assessment Pain Scale: 0-10 Pain Score: 5  Pain Type: Surgical pain Pain Location: Hip Pain Orientation: Right Pain Descriptors / Indicators: Aching;Discomfort Pain Frequency: Intermittent Pain Onset: Gradual Patients Stated Pain Goal: 2 Pain Intervention(s): Medication (See eMAR)    Therapy/Group: Individual  Therapy   Excell Seltzer, PT, DPT  05/16/2018, 12:55 PM

## 2018-05-16 NOTE — Progress Notes (Signed)
[place] PHYSICAL MEDICINE & REHABILITATION PROGRESS NOTE   Subjective/Complaints: Right knee a little better with voltaren gel. Right hip remains tender. Was able to move right leg in therapy yesterday. Robaxin helps pain but makes her feel somewhat foggy. Asked if she can take tylenol with norco.  ROS: Patient denies fever, rash, sore throat, blurred vision, nausea, vomiting, diarrhea, cough, shortness of breath or chest pain, joint or back pain, headache, or mood change.    Objective:   Vas [place] Lower Extremity Venous (dvt)  Result Date: 05/15/2018  Lower Venous Study Indications: Swelling.  Performing Technologist: Maudry Mayhew MHA, RDMS, RVT, RDCS  Examination Guidelines: A complete evaluation includes B-mode imaging, spectral Doppler, color Doppler, and power Doppler as needed of all accessible portions of each vessel. Bilateral testing is considered an integral part of a complete examination. Limited examinations for reoccurring indications may be performed as noted.  +---------+---------------+---------+-----------+----------+-------+ RIGHT    CompressibilityPhasicitySpontaneityPropertiesSummary +---------+---------------+---------+-----------+----------+-------+ CFV      Full           Yes      Yes                          +---------+---------------+---------+-----------+----------+-------+ SFJ      Full                                                 +---------+---------------+---------+-----------+----------+-------+ FV Prox  Full                                                 +---------+---------------+---------+-----------+----------+-------+ FV Mid   Full                                                 +---------+---------------+---------+-----------+----------+-------+ FV DistalFull                                                 +---------+---------------+---------+-----------+----------+-------+ PFV      Full                                                  +---------+---------------+---------+-----------+----------+-------+ POP      Full           Yes      Yes                          +---------+---------------+---------+-----------+----------+-------+ PTV      Full                                                 +---------+---------------+---------+-----------+----------+-------+ PERO     Full                                                 +---------+---------------+---------+-----------+----------+-------+   +----+---------------+---------+-----------+----------+-------+  LEFTCompressibilityPhasicitySpontaneityPropertiesSummary +----+---------------+---------+-----------+----------+-------+ CFV Full           Yes      Yes                          +----+---------------+---------+-----------+----------+-------+     Summary: Right: There is no evidence of deep vein thrombosis in the lower extremity. No cystic structure found in the popliteal fossa. Left: No evidence of common femoral vein obstruction.  *See table(s) above for measurements and observations. Electronically signed by Monica Martinez MD on 05/15/2018 at 5:18:20 PM.    Final    Recent Labs    05/13/18 1240 05/15/18 0636  WBC 9.9 7.6  HGB 10.7* 10.4*  HCT 32.9* 32.4*  PLT 165 197   Recent Labs    05/13/18 1240 05/15/18 0636  NA 134* 135  K 4.0 3.9  CL 97* 101  CO2 25 24  GLUCOSE 198* 129*  BUN 16 17  CREATININE 0.96 0.97  CALCIUM 9.3 8.8*    Intake/Output Summary (Last 24 hours) at 05/16/2018 1007 Last data filed at 05/15/2018 1755 Gross per 24 hour  Intake 560 ml  Output -  Net 560 ml     Physical Exam: Vital Signs Blood pressure (!) 176/70, pulse 66, temperature 97.7 F (36.5 C), temperature source Oral, resp. rate 17, height 5\' 3"  (1.6 m), weight 103 kg, last menstrual period 01/15/1982, SpO2 93 %. Constitutional: No distress . Vital signs reviewed. HEENT: EOMI, oral membranes moist Neck:  supple Cardiovascular: RRR without murmur. No JVD    Respiratory: CTA Bilaterally without wheezes or rales. Normal effort    GI: BS +, non-tender, non-distended  Musculoskeletal:     Comments: right hip with swelling, remains somewhat tender.  -right knee was less tender with palp and PROM-- Neurological: She is alert.  Good insight and awareness. Motor: Bilateral upper extremities: 5/5 proximal distal Left lower extremity: Hip flexion, knee extension 4 medicine/5, ankle dorsiflexion 4+/5 Right lower extremity: Hip flexion and KE 2-/5 (pain),  ankle dorsiflexion/APF 4/5  Skin:  Hip incision clean and dry with dressings in place  Psychiatric: pleasant    Assessment/Plan: 1. Functional deficits secondary to right femoral neck fracture, endstage OA right knee which require 3+ hours per day of interdisciplinary therapy in a comprehensive inpatient rehab setting.  Physiatrist is providing close team supervision and 24 hour management of active medical problems listed below.  Physiatrist and rehab team continue to assess barriers to discharge/monitor patient progress toward functional and medical goals  Care Tool:  Bathing    Body parts bathed by patient: Right arm, Left upper leg, Left arm, Chest, Abdomen, Face, Front perineal area, Buttocks, Right upper leg   Body parts bathed by helper: Right lower leg, Left lower leg     Bathing assist Assist Level: Moderate Assistance - Patient 50 - 74%     Upper Body Dressing/Undressing Upper body dressing   What is the patient wearing?: Hospital gown only    Upper body assist Assist Level: Set up assist    Lower Body Dressing/Undressing Lower body dressing      What is the patient wearing?: Hospital gown only, Incontinence brief     Lower body assist Assist for lower body dressing: Maximal Assistance - Patient 25 - 49%     Toileting Toileting    Toileting assist Assist for toileting: Minimal Assistance - Patient > 75%      Transfers Chair/bed transfer  Transfers assist  Chair/bed transfer assist level: Moderate Assistance - Patient 50 - 74%     Locomotion Ambulation   Ambulation assist      Assist level: Moderate Assistance - Patient 50 - 74% Assistive device: Walker-rolling Max distance: 5'   Walk 10 feet activity   Assist  Walk 10 feet activity did not occur: Safety/medical concerns        Walk 50 feet activity   Assist Walk 50 feet with 2 turns activity did not occur: Safety/medical concerns         Walk 150 feet activity   Assist Walk 150 feet activity did not occur: Safety/medical concerns         Walk 10 feet on uneven surface  activity   Assist Walk 10 feet on uneven surfaces activity did not occur: Safety/medical concerns         Wheelchair     Assist Will patient use wheelchair at discharge?: (TBD)             Wheelchair 50 feet with 2 turns activity    Assist            Wheelchair 150 feet activity     Assist           Medical Problem List and Plan: 1.  Decreased functional mobility secondary to comminuted angulated intertrochanteric right femur fracture. Status post IM nailing 05/10/2018. Weightbearing as tolerated.             -Patient is beginning CIR therapies today including PT and OT  2.  Antithrombotics: -DVT/anticoagulation:  SCDs.  Vascular Doppler ordered             -antiplatelet therapy: Aspirin 81 mg daily 3. Pain Management: Hydrocodone   as needed. Pt wants to limit use  -continue voltaren gel to right knee  -ice  -will dc robaxin and try flexeril 5mg  q8 prn spasms  -add scheduled tylenol 500mg  tid 4. Mood:  Provide emotional support             -antipsychotic agents: N/A 5. Neuropsych: This patient is capable of making decisions on her own behalf. 6. Skin/Wound Care:  continue current dressings to right hip 7. Fluids/Electrolytes/Nutrition:  encourage po  - intake adequate 8. Acute blood loss  anemia. hgb up to 10.4  -no bleeding 9. Hypertension. Norvasc 10 mg daily, Cozaar 100 mg daily--changed cozaar to HS per home regimen 10. Hypothyroidism. Synthroid 11.History of ulcerative colitis as well as colon cancer with resection. Continue Azulfidine 1000 mg twice a day 12. Diet-controlled diabetes mellitus. Blood sugar checks discontinued. Continue diabetic diet. Reasonable  control at present 13. Constipation. Laxative assistance  14.  Morbid obesity: Encouraged appropriate dietary choices    LOS: 2 days A FACE TO [place] 05/16/2018, 10:07 AM

## 2018-05-17 ENCOUNTER — Inpatient Hospital Stay (HOSPITAL_COMMUNITY): Payer: Medicare Other

## 2018-05-17 ENCOUNTER — Inpatient Hospital Stay (HOSPITAL_COMMUNITY): Payer: Medicare Other | Admitting: Occupational Therapy

## 2018-05-17 DIAGNOSIS — G8918 Other acute postprocedural pain: Secondary | ICD-10-CM

## 2018-05-17 DIAGNOSIS — K5901 Slow transit constipation: Secondary | ICD-10-CM

## 2018-05-17 DIAGNOSIS — R0989 Other specified symptoms and signs involving the circulatory and respiratory systems: Secondary | ICD-10-CM

## 2018-05-17 DIAGNOSIS — D62 Acute posthemorrhagic anemia: Secondary | ICD-10-CM

## 2018-05-17 NOTE — Progress Notes (Signed)
Occupational Therapy Session Note  Patient Details  Name: Jenna Chambers MRN: [id number] Date of Birth: December 15, 1931  Today's Date: 05/17/2018 OT Individual Time: 1300-1400 OT Individual Time Calculation (min): 60 min    Short Term Goals: Week 1:  OT Short Term Goal 1 (Week 1): Pt will don pants with steadying assist using AE PRN OT Short Term Goal 2 (Week 1): Pt will consistently complete stand pivot transfer with min A using LRAD in prep for functional task OT Short Term Goal 3 (Week 1): Pt will stand to complete 1 grooming task with CGA to increase functional standing balance/endurance  Skilled Therapeutic Interventions/Progress Updates:    Upon entering the room, pt transferring onto commode chair with NT in room. Pt verbalizing need for BM this session. Pt able to perform hygiene with set up A and min A for standing balance. Pt needing assistance to don brief and mod A stand pivot transfer with RW back to bed. Pt agreeable to education regarding use of reacher to increase I in LB dressing. Pt able to thread pants onto B LEs with use of reacher after demonstration and min verbal cuing for technique. Pt declined pull pants over B hips and instead demonstrated ability to doff from this point with reacher as well. Pt's BP seated on EOB is 171/58 with RN notified and pt asymptomatic. OT assisted pt back to bed with min A for sit >supine. OT provided pt with paper handouts regarding energy conservation and pt reviewed main points. Education to continue on this topic. Pt supine with call bell and all needed items within reach. Bed alarm activated.   Therapy Documentation Precautions:  Precautions Precautions: Fall Restrictions Weight Bearing Restrictions: Yes RLE Weight Bearing: Weight bearing as tolerated Vital Signs: Therapy Vitals Temp: (!) 97.5 F (36.4 C) Pulse Rate: 72 BP: (!) 171/58 Patient Position (if appropriate): Sitting Oxygen Therapy SpO2: 99 % O2 Device: Room  Air   Therapy/Group: Individual Therapy  Gypsy Decant 05/17/2018, 3:26 PM

## 2018-05-17 NOTE — Progress Notes (Signed)
[facility] PHYSICAL MEDICINE & REHABILITATION PROGRESS NOTE   Subjective/Complaints: Patient seen sitting up in bed this morning.  She states she slept well overnight.  She states she is weaker and she would like to begin her progress is slower than she thought was going to be.  ROS: Denies CP, SOB, N/V/D  Objective:   Vas [place] Lower Extremity Venous (dvt)  Result Date: 05/15/2018  Lower Venous Study Indications: Swelling.  Performing Technologist: Maudry Mayhew MHA, RDMS, RVT, RDCS  Examination Guidelines: A complete evaluation includes B-mode imaging, spectral Doppler, color Doppler, and power Doppler as needed of all accessible portions of each vessel. Bilateral testing is considered an integral part of a complete examination. Limited examinations for reoccurring indications may be performed as noted.  +---------+---------------+---------+-----------+----------+-------+ RIGHT    CompressibilityPhasicitySpontaneityPropertiesSummary +---------+---------------+---------+-----------+----------+-------+ CFV      Full           Yes      Yes                          +---------+---------------+---------+-----------+----------+-------+ SFJ      Full                                                 +---------+---------------+---------+-----------+----------+-------+ FV Prox  Full                                                 +---------+---------------+---------+-----------+----------+-------+ FV Mid   Full                                                 +---------+---------------+---------+-----------+----------+-------+ FV DistalFull                                                 +---------+---------------+---------+-----------+----------+-------+ PFV      Full                                                 +---------+---------------+---------+-----------+----------+-------+ POP      Full           Yes      Yes                           +---------+---------------+---------+-----------+----------+-------+ PTV      Full                                                 +---------+---------------+---------+-----------+----------+-------+ PERO     Full                                                 +---------+---------------+---------+-----------+----------+-------+   +----+---------------+---------+-----------+----------+-------+  LEFTCompressibilityPhasicitySpontaneityPropertiesSummary +----+---------------+---------+-----------+----------+-------+ CFV Full           Yes      Yes                          +----+---------------+---------+-----------+----------+-------+     Summary: Right: There is no evidence of deep vein thrombosis in the lower extremity. No cystic structure found in the popliteal fossa. Left: No evidence of common femoral vein obstruction.  *See table(s) above for measurements and observations. Electronically signed by Monica Martinez MD on 05/15/2018 at 5:18:20 PM.    Final    Recent Labs    05/15/18 0636  WBC 7.6  HGB 10.4*  HCT 32.4*  PLT 197   Recent Labs    05/15/18 0636  NA 135  K 3.9  CL 101  CO2 24  GLUCOSE 129*  BUN 17  CREATININE 0.97  CALCIUM 8.8*    Intake/Output Summary (Last 24 hours) at 05/17/2018 1101 Last data filed at 05/17/2018 0801 Gross per 24 hour  Intake 600 ml  Output -  Net 600 ml     Physical Exam: Vital Signs Blood pressure (!) 155/60, pulse (!) 57, temperature 98 F (36.7 C), temperature source Oral, resp. rate 18, height 5\' 3"  (1.6 m), weight 103 kg, last menstrual period 01/15/1982, SpO2 94 %. Constitutional: No distress . Vital signs reviewed. HENT: Normocephalic.  Atraumatic. Eyes: EOMI. No discharge. Cardiovascular: No JVD. Respiratory: Normal effort. GI: Non-distended. Musc: Right hip with edema and tenderness  Neurological: She is alert.  Good insight and awareness.  Motor: Bilateral upper extremities: 5/5 proximal distal Left lower  extremity: Hip flexion, knee extension 4-/5, ankle dorsiflexion 4+/5 Right lower extremity: Hip flexion and KE 2-/5 (pain),  ankle dorsiflexion/APF 4/5  Skin:  Hip incision with dressing C/D/I Psychiatric: pleasant    Assessment/Plan: 1. Functional deficits secondary to right femoral neck fracture, endstage OA right knee which require 3+ hours per day of interdisciplinary therapy in a comprehensive inpatient rehab setting.  Physiatrist is providing close team supervision and 24 hour management of active medical problems listed below.  Physiatrist and rehab team continue to assess barriers to discharge/monitor patient progress toward functional and medical goals  Care Tool:  Bathing    Body parts bathed by patient: Right arm, Left upper leg, Left arm, Chest, Abdomen, Face, Front perineal area, Right upper leg, Right lower leg, Left lower leg   Body parts bathed by helper: Buttocks     Bathing assist Assist Level: Minimal Assistance - Patient > 75%(LH sponge)     Upper Body Dressing/Undressing Upper body dressing   What is the patient wearing?: Hospital gown only    Upper body assist Assist Level: Contact Guard/Touching assist    Lower Body Dressing/Undressing Lower body dressing      What is the patient wearing?: Incontinence brief, Hospital gown only     Lower body assist Assist for lower body dressing: Maximal Assistance - Patient 25 - 49%     Toileting Toileting    Toileting assist Assist for toileting: Moderate Assistance - Patient 50 - 74%     Transfers Chair/bed transfer  Transfers assist     Chair/bed transfer assist level: Moderate Assistance - Patient 50 - 74%     Locomotion Ambulation   Ambulation assist      Assist level: Moderate Assistance - Patient 50 - 74% Assistive device: Walker-rolling Max distance: 7'   Walk 10 feet activity   Assist  Walk 10 feet activity did not occur: Safety/medical concerns        Walk 50 feet  activity   Assist Walk 50 feet with 2 turns activity did not occur: Safety/medical concerns         Walk 150 feet activity   Assist Walk 150 feet activity did not occur: Safety/medical concerns         Walk 10 feet on uneven surface  activity   Assist Walk 10 feet on uneven surfaces activity did not occur: Safety/medical concerns         Wheelchair     Assist Will patient use wheelchair at discharge?: Yes Type of Wheelchair: Manual    Wheelchair assist level: Supervision/Verbal cueing Max wheelchair distance: 100'    Wheelchair 50 feet with 2 turns activity    Assist        Assist Level: Supervision/Verbal cueing   Wheelchair 150 feet activity     Assist           Medical Problem List and Plan: 1.  Decreased functional mobility secondary to comminuted angulated intertrochanteric right femur fracture. Status post IM nailing 05/10/2018. Weightbearing as tolerated.  Continue CIR 2.  Antithrombotics: -DVT/anticoagulation:  SCDs.   Vascular ultrasound negative for DVT             -antiplatelet therapy: Aspirin 81 mg daily 3. Pain Management: Hydrocodone   as needed. Pt wants to limit use  -continue voltaren gel to right knee  -ice  -DCed Robaxin, continueflexeril 5mg  q8 prn spasms  Added scheduled tylenol 500mg  tid 4. Mood:  Provide emotional support             -antipsychotic agents: N/A  She requires positive reinforcement and encouragement. 5. Neuropsych: This patient is capable of making decisions on her own behalf. 6. Skin/Wound Care:  continue current dressings to right hip 7. Fluids/Electrolytes/Nutrition:  encourage po 8. Acute blood loss anemia.   hgb 10.4 on 4/30  Continue to monitor 9. Hypertension. Norvasc 10 mg daily, Cozaar 100 mg daily--changed cozaar to HS per home regimen  Labile on 5/2, monitor for trend 10. Hypothyroidism. Synthroid 11.History of ulcerative colitis as well as colon cancer with resection. Continue  Azulfidine 1000 mg twice a day 12. Diet-controlled diabetes mellitus. Blood sugar checks discontinued. Continue diabetic diet.  13. Constipation. Laxative assistance    Improving 14.  Morbid obesity: Encouraged appropriate dietary choices  LOS: 3 days A FACE TO FACE EVALUATION WAS PERFORMED  Bobbe Quilter Lorie Phenix 05/17/2018, 11:01 AM

## 2018-05-17 NOTE — Progress Notes (Addendum)
Physical Therapy Session Note  Patient Details  Name: Jenna Chambers MRN: [id number] Date of Birth: 11/02/1931  Today's Date: 05/17/2018 PT Individual Time: 0902-1003 PT Individual Time Calculation (min): 61 min   Short Term Goals: Week 1:  PT Short Term Goal 1 (Week 1): Pt will perform least restrictive transfers with min A consistently PT Short Term Goal 2 (Week 1): Pt will initiate stair training PT Short Term Goal 3 (Week 1): Pt will ambulate x 10 ft with LRAD and min A  Skilled Therapeutic Interventions/Progress Updates:     Patient in w/c upon PT arrival. Patient alert and agreeable to PT session. Patient reported being pre-medicated for pain prior to PT session.  Therapeutic Activity: Bed Mobility: Patient performed sit to supine with min A for LE management. Provided verbal cues for lifting B LEs. Transfers: Patient performed sit to/from stand x2 with mod A x1 and min A x1 and a stand pivot transfer with min A x1 using a RW for all transfers. Provided verbal cues for sliding R LE forward before standing and sitting to reduce weight bearing for comfort and to reach back before sitting. Patient reported severe dizziness in standing during first trial. BP 136/59 in sitting and 79/51 in standing with severe dizziness during second trial. Limited remainder of session to bed level exercises once patient was transferred to the bed due to symptomatic orthostatic hypotension.   Therapeutic Exercise: Patient performed the following exercises with verbal and tactile cues for proper technique. -R quad sets 2x10 -R SLR AAROM 2x5 -B glut sets 2x10 -R heel slides AAROM 1x5 -R hip abduction 1x10 -B isometric hip adduction 2x10 -B ankle pumps 2x20  Patient in bed at end of session with breaks locked, bed alarm set, and all needs within reach. Educated on pressure relief in the bed and benefits of sitting OOB throughout the day, with change of position at least every 2 hours.   Therapy  Documentation Precautions:  Precautions Precautions: Fall Restrictions Weight Bearing Restrictions: Yes RLE Weight Bearing: Weight bearing as tolerated Pain: Pain Assessment Pain Scale: 0-10 Pain Score: 4 (at beginning of session); 8 (at end of session, RN aware) Pain Type: Surgical pain Pain Location: Hip Pain Orientation: Right Pain Radiating Towards: (leg) Pain Descriptors / Indicators: Aching Pain Frequency: Intermittent Pain Onset: On-going Pain Intervention(s): reposition; distraction Multiple Pain Sites: No   Therapy/Group: Individual Therapy  Porfiria Heinrich L Fanny Agan PT, DPT  05/17/2018, 12:42 PM

## 2018-05-17 NOTE — Progress Notes (Signed)
Occupational Therapy Session Note  Patient Details  Name: Jenna Chambers MRN: [id number] Date of Birth: 1931-01-31  Today's Date: 05/17/2018 OT Individual Time: 6408-9097 OT Individual Time Calculation (min): 27 min    Short Term Goals: Week 1:  OT Short Term Goal 1 (Week 1): Pt will don pants with steadying assist using AE PRN OT Short Term Goal 2 (Week 1): Pt will consistently complete stand pivot transfer with min A using LRAD in prep for functional task OT Short Term Goal 3 (Week 1): Pt will stand to complete 1 grooming task with CGA to increase functional standing balance/endurance  Skilled Therapeutic Interventions/Progress Updates:    Pt greeted seated in wc and agreeable to OT treatment session focused on UB there-ex. Pt issued level 2 orange there-band. Completed 4 sets of 10 seated row, triceps press, bicep curl, and straight arm raises. Rest breaks in between sets. Pt requested pain meds for 6/10 R LE pain. Nursing notified and came and administered medications. Pt left seated in wc at end of session with needs met awaiting next therapy session.   Therapy Documentation Precautions:  Precautions Precautions: Fall Restrictions Weight Bearing Restrictions: Yes RLE Weight Bearing: Weight bearing as tolerated Pain: Pain Assessment Pain Scale: 0-10 Pain Score: 6  Pain Type: Surgical pain Pain Location: Hip Pain Orientation: Right Pain Radiating Towards: (leg) Pain Descriptors / Indicators: Aching Pain Frequency: Intermittent Pain Onset: On-going Patients Stated Pain Goal: 0 Pain Intervention(s): Nursing administered pain mediciation Multiple Pain Sites: No   Therapy/Group: Individual Therapy  Valma Cava 05/17/2018, 10:05 AM

## 2018-05-17 NOTE — Progress Notes (Signed)
Social Work Assessment and Plan  Patient Details  Name: Jenna Chambers MRN: [id number] Date of Birth: [birth year]  Today's Date: 05/15/2018  Problem List:  Patient Active Problem List   Diagnosis Date Noted  . Displaced fracture of right femoral neck ([place]) 05/14/2018  . Essential hypertension   . Hypothyroidism   . Acute blood loss anemia 05/13/2018  . Obesity, Class III, BMI 40-49.9 (morbid obesity) ([place]) 05/13/2018  . Hip fracture ([place]) 05/10/2018  . Colon cancer ([place]) 02/27/2013  . Essential hypertension, benign 02/27/2013  . Glaucoma 02/27/2013  . Ulcerative colitis ([place]) 02/27/2013  . Diabetes [facility])    Past Medical History:  Past Medical History:  Diagnosis Date  . Colon cancer ([place])    resecetion  . Diabetes ([place])    Type 2  . Glaucoma    --both eyes per patient getting worse  . Gout   . Hypertension   . Osteopenia   . Sleep apnea    no CPAP  . Ulcerative colitis [facility])    Past Surgical History:  Past Surgical History:  Procedure Laterality Date  . BILATERAL SALPINGOOPHORECTOMY  1993   serous cystadenoma  . CATARACT EXTRACTION  2006, 2008   gluacoma  . EXPLORATORY LAPAROTOMY  1996   incarcerated hernia  . INTRAMEDULLARY (IM) NAIL INTERTROCHANTERIC Right 05/10/2018   Procedure: INTRAMEDULLARY (IM) NAIL INTERTROCHANTRIC;  Surgeon: [name], MD;  Location: [place];  Service: Orthopedics;  Laterality: Right;  . PARTIAL COLECTOMY  1991  . TOTAL KNEE ARTHROPLASTY Left 2010   Social History:  reports that she has never smoked. She has never used smokeless tobacco. She reports that she does not drink alcohol or use drugs.  Family / Support Systems Marital Status: Widow/Widower How Long?: 20 years Patient Roles: Other (Comment)(aunt) Other Supports: [place] - niece - [phone] Anticipated Caregiver: niece and hired assist  Ability/Limitations of Caregiver: niece can't herself do 24/7 sueprvision but can arrnage hired assist Caregiver  Availability: Other (Comment)(as above) Family Dynamics: close relationship with niece  Social History Preferred language: English Religion: Baptist Read: Yes Write: Yes Employment Status: Retired Date Retired/Disabled/Unemployed: years ago - pt was a Network engineer at [employer] Issues: none reported Guardian/Conservator: N/A - MD had determined that pt is ccapable of making her own decisions.   Abuse/Neglect Abuse/Neglect Assessment Can Be Completed: Yes Physical Abuse: Denies Verbal Abuse: Denies Sexual Abuse: Denies Exploitation of patient/patient's resources: Denies Self-Neglect: Denies  Emotional Status Pt's affect, behavior and adjustment status: Pt is upbeat and motivated to rehabilitate and get home. Recent Psychosocial Issues: none reported Psychiatric History: none reported Substance Abuse History: none reported  Patient / Family Perceptions, Expectations & Goals Pt/Family understanding of illness & functional limitations: Pt/niece have a good understanding of pt's condition and expected d/c needs. Premorbid pt/family roles/activities: Pt was very independent PTA. She drove, likes to read, and spends time with friends. Anticipated changes in roles/activities/participation: Pt knows she will need someone with her to provide supervision for a short time, but then she would like to resume activities when she can. Pt/family expectations/goals: Pt really just wants to get well and get home, be able to do as much for herself there as possible.  Community [employer] Agencies: None Premorbid Home Care/DME Agencies: Other (Comment)(Pt was trying to remember the name of her HHA she had after leaving [facility] where she rehabilitated after a knee replacement. Pt also had some private duty care, as well.) Transportation available at discharge: niece  Discharge Planning Living Arrangements: Alone Support Systems: Other relatives,  Friends/neighbors Type of Residence: Private residence Insurance Resources: Multimedia programmer (specify)([employer]) Financial Resources: Social Security Financial Screen Referred: No Money Management: Patient Does the patient have any problems obtaining your medications?: No Home Management: Pt was doing a lot of this or hired help, as needed. Patient/Family Preliminary Plans: Pt plans to return to her home with her niece and paid caregivers to come to assist pt at home with supervision.  Niece asked about Assisted Living as an option and we will see what pt's needs are closer to d/c. Social Work Anticipated Follow Up Needs: HH/OP Expected length of stay: 12 to 14 days  Clinical Impression CSW met with pt to introduce self and role of CSW, as well as to complete assessment.  Pt was upbeat and positive about her recovery and happy to be able to come to CIR this time, as she went to a SNF after her knee replacement.  Pt reports feeling well emotionally and is looking forward to getting home and the virus ending and being able to get back to her "regular life."  Pt's niece is supportive, but cannot be with her all the time.  They plan to hire caregivers or maybe look into respite care at an assisted living facility.  CSW will work with pt/niece when we see what her d/c care recommendations are.  Pt has no current concerns/needs/questions, but CSW will continue to follow and assist as needed.  Vidur Knust, [name] 05/17/2018, 1:02 AM

## 2018-05-17 NOTE — Progress Notes (Signed)
Physical Therapy Session Note  Patient Details  Name: Jenna Chambers MRN: [id number] Date of Birth: [birth year]  Today's Date: 05/17/2018 PT Individual Time: 1530-1615 PT Individual Time Calculation (min): 45 min   Short Term Goals: Week 1:  PT Short Term Goal 1 (Week 1): Pt will perform least restrictive transfers with min A consistently PT Short Term Goal 2 (Week 1): Pt will initiate stair training PT Short Term Goal 3 (Week 1): Pt will ambulate x 10 ft with LRAD and min A  Skilled Therapeutic Interventions/Progress Updates:    Patient relates issues with BP earlier today.  States did bed exercise mainly.  Performed orthostatic BP testing as noted below.  Patient reports less symptoms than earlier today.  Seated EOB while PT obtained TED from supply closet.  Spoke with RN who had MD on phone who approved application of knee high TED stockings.  Noted pt with larger legs.  Stand pivot to w/c with RW mod A.  Patient assisted to general gym.  Measurements taken for TED stockings and pt measures for XL regular.  RN made aware needs to be ordered. BP in gym 146/61, HR 55.  Patient assisted back to room.  Stand step to recliner from w/c about 2' mod A with RW.  Patient reclined and positioned with chair alarm, ice pack and call bell/needs in reach.   Orthostatic VS for the past 24 hrs (Last 3 readings):  BP- Lying Pulse- Lying BP- Sitting Pulse- Sitting BP- Standing at 0 minutes Pulse- Standing at 0 minutes  05/17/18 1647 152/59 60 130/71 59 108/49 63    Therapy Documentation Precautions:  Precautions Precautions: Fall Restrictions Weight Bearing Restrictions: Yes RLE Weight Bearing: Weight bearing as tolerated Pain: Pain Assessment Pain Score: 6  Pain Type: Surgical pain Pain Location: Hip Pain Orientation: Right Pain Descriptors / Indicators: Aching;Sore Pain Onset: On-going Pain Intervention(s): Repositioned;Cold applied    Therapy/Group: Individual Therapy  Reginia Naas  Magda Kiel, PT 05/17/2018, 4:47 PM

## 2018-05-18 MED ORDER — HYDRALAZINE HCL 10 MG PO TABS
10.0000 mg | ORAL_TABLET | Freq: Three times a day (TID) | ORAL | Status: DC
  Administered 2018-05-18 – 2018-05-30 (×36): 10 mg via ORAL
  Filled 2018-05-18 (×36): qty 1

## 2018-05-18 NOTE — Progress Notes (Signed)
[facility] PHYSICAL MEDICINE & REHABILITATION PROGRESS NOTE   Subjective/Complaints: Patient seen laying in bed this AM.  She states she slept well overnight and is still sleepy.    ROS: Denies CP, SOB, N/V/D  Objective:   No results found. No results for input(s): WBC, HGB, HCT, PLT in the last 72 hours. No results for input(s): NA, K, CL, CO2, GLUCOSE, BUN, CREATININE, CALCIUM in the last 72 hours.  Intake/Output Summary (Last 24 hours) at 05/18/2018 1035 Last data filed at 05/18/2018 0900 Gross per 24 hour  Intake 560 ml  Output -  Net 560 ml     Physical Exam: Vital Signs Blood pressure (!) 155/70, pulse (!) 54, temperature 98 F (36.7 C), temperature source Oral, resp. rate 16, height 5\' 3"  (1.6 m), weight 103 kg, last menstrual period 01/15/1982, SpO2 98 %. Constitutional: No distress . Vital signs reviewed. HENT: Normocephalic, atraumatic.  Eyes: EOMI. No discharge. Cardiovascular: No JVD. Respiratory: Normal effort. GI: Non-distended. Musc: Right hip with edema and tenderness  Neurological: She is alert.  Good insight and awareness.  Motor: Bilateral upper extremities: 5/5 proximal distal Left lower extremity: Hip flexion, knee extension 4-/5, ankle dorsiflexion 4+/5, stable Right lower extremity: Hip flexion and KE 2-/5 (pain),  ankle dorsiflexion/APF 4/5, stable Skin:  Hip incision with dressing C/D/I Psychiatric: pleasant  Assessment/Plan: 1. Functional deficits secondary to right femoral neck fracture, endstage OA right knee which require 3+ hours per day of interdisciplinary therapy in a comprehensive inpatient rehab setting.  Physiatrist is providing close team supervision and 24 hour management of active medical problems listed below.  Physiatrist and rehab team continue to assess barriers to discharge/monitor patient progress toward functional and medical goals  Care Tool:  Bathing    Body parts bathed by patient: Right arm, Left upper leg, Left arm,  Chest, Abdomen, Face, Front perineal area, Right upper leg, Right lower leg, Left lower leg   Body parts bathed by helper: Buttocks     Bathing assist Assist Level: Minimal Assistance - Patient > 75%(LH sponge)     Upper Body Dressing/Undressing Upper body dressing   What is the patient wearing?: Hospital gown only    Upper body assist Assist Level: Contact Guard/Touching assist    Lower Body Dressing/Undressing Lower body dressing      What is the patient wearing?: Incontinence brief, Pants     Lower body assist Assist for lower body dressing: Moderate Assistance - Patient 50 - 74%     Toileting Toileting    Toileting assist Assist for toileting: Moderate Assistance - Patient 50 - 74%     Transfers Chair/bed transfer  Transfers assist     Chair/bed transfer assist level: Moderate Assistance - Patient 50 - 74%     Locomotion Ambulation   Ambulation assist      Assist level: Moderate Assistance - Patient 50 - 74% Assistive device: Walker-rolling Max distance: 2'   Walk 10 feet activity   Assist  Walk 10 feet activity did not occur: Safety/medical concerns        Walk 50 feet activity   Assist Walk 50 feet with 2 turns activity did not occur: Safety/medical concerns         Walk 150 feet activity   Assist Walk 150 feet activity did not occur: Safety/medical concerns         Walk 10 feet on uneven surface  activity   Assist Walk 10 feet on uneven surfaces activity did not occur: Safety/medical concerns  Wheelchair     Assist Will patient use wheelchair at discharge?: Yes Type of Wheelchair: Manual    Wheelchair assist level: Supervision/Verbal cueing Max wheelchair distance: 100'    Wheelchair 50 feet with 2 turns activity    Assist        Assist Level: Supervision/Verbal cueing   Wheelchair 150 feet activity     Assist           Medical Problem List and Plan: 1.  Decreased functional  mobility secondary to comminuted angulated intertrochanteric right femur fracture. Status post IM nailing 05/10/2018. Weightbearing as tolerated.  Continue CIR 2.  Antithrombotics: -DVT/anticoagulation:  SCDs.   Vascular ultrasound negative for DVT             -antiplatelet therapy: Aspirin 81 mg daily 3. Pain Management: Hydrocodone   as needed. Pt wants to limit use  -continue voltaren gel to right knee  -ice  -DCed Robaxin, continueflexeril 5mg  q8 prn spasms  Added scheduled tylenol 500mg  tid  Relatively controlled on 5/3 4. Mood:  Provide emotional support             -antipsychotic agents: N/A  She requires positive reinforcement and encouragement. 5. Neuropsych: This patient is capable of making decisions on her own behalf. 6. Skin/Wound Care:  continue current dressings to right hip 7. Fluids/Electrolytes/Nutrition:  encourage po 8. Acute blood loss anemia.   hgb 10.4 on 4/30  Continue to monitor 9. Hypertension. Norvasc 10 mg daily, Cozaar 100 mg daily--changed cozaar to HS per home regimen  Hydralazine 10 TID started, for persistently elevated blood pressures 10. Hypothyroidism. Synthroid 11.History of ulcerative colitis as well as colon cancer with resection. Continue Azulfidine 1000 mg twice a day 12. Diet-controlled diabetes mellitus. Blood sugar checks discontinued. Continue diabetic diet.  13. Constipation. Laxative assistance    Improving with good BMs on 5/2 14.  Morbid obesity: Encouraged appropriate dietary choices  LOS: 4 days A FACE TO FACE EVALUATION WAS PERFORMED  Maxden Naji Lorie Phenix 05/18/2018, 10:35 AM

## 2018-05-19 ENCOUNTER — Inpatient Hospital Stay (HOSPITAL_COMMUNITY): Payer: Medicare Other | Admitting: Occupational Therapy

## 2018-05-19 ENCOUNTER — Inpatient Hospital Stay (HOSPITAL_COMMUNITY): Payer: Medicare Other | Admitting: Physical Therapy

## 2018-05-19 ENCOUNTER — Inpatient Hospital Stay (HOSPITAL_COMMUNITY): Payer: Medicare Other

## 2018-05-19 NOTE — Progress Notes (Signed)
Occupational Therapy Session Note  Patient Details  Name: Jenna Chambers MRN: [id number] Date of Birth: [birth year]  Today's Date: 05/19/2018 OT Individual Time: 0930-1030 and 1130-1230 OT Individual Time Calculation (min): 60 min and 60 min   Short Term Goals: Week 1:  OT Short Term Goal 1 (Week 1): Pt will don pants with steadying assist using AE PRN OT Short Term Goal 2 (Week 1): Pt will consistently complete stand pivot transfer with min A using LRAD in prep for functional task OT Short Term Goal 3 (Week 1): Pt will stand to complete 1 grooming task with CGA to increase functional standing balance/endurance  Skilled Therapeutic Interventions/Progress Updates:    Session One: Pt seen for OT ADL bathing/dressing session. Pt sitting up in w/c upon arrival, no pain at rest and reports being pre-medicated prior to tx session. Pt desiring to shower this AM. Completed min A stand pivot transfer w/c> BSC over toilet with use of grab bars. Assist provided for clothing management and required assist for thoroughness of hygiene following BM. She was able to ambulate ~23ft to tub transfer bench in shower with min A using RW. She bathed with supervision seated on tub transfer bench using LH sponge to assist with LB bathing. Mod A required for stand pivot out of shower with heavy reliance on grab bars.  Grooming tasks completed from w/c level at sink with set-up of needed items. She donned hospital gown with set-up, standing with min A to RW for brief to be donned total A and to pull down gown. Pt requesting to return to reliance at end of session. Min A stand pivot transfer to recliner with significantly increased time and noted limited WBing through R LE.  Pt left seated in recliner with all needs in reach.  Education provied throughout session regarding OT/PT goals, home layout and accessibility, continuum of care, DME and d/c planning.   Session Two: Pt seen for OT session focuisng on LE strengthening and  mobility. Pt sitting up in recliner upon arrival, agreeable to tx session and no pain at rest. Pre-medicated prior to tx session.  Orthostatic vitals assessed. Pt with "minimal" symptoms.  Sitting in recliner with feet elevated: 144/60 Sitting upright in recliner with feet down 142/59 Standing: 130/53  Completed sit>stand from recliner with mod A. Min A stand pivot to recliner with RW. She self propelled w/c to therapy gym for UE strengthening and endurance. Min A stand pivot to therapy mat. Completed the following exercises seated EOM, x3 sets of 10:  glute squeezes, LARQ, and knee raises. Rest breaks provided throughout. She was unable to complete knee raises with R UE due to severe pain in R hip. Rest breaks provided throughout Pt introduced to leg lifter, practiced picking up foot with leg lifter~6 inches from floor.  Completed lateral leans to L in prep for functional tasks, x5 with supervision. Pt ambulated ~44ft back to w/c with min A and significantly increased time.  Pt returned to room and left seated in w/c with all needs in reach, set-up with lunch tray and RN present.  Education/discussion throughout session regarding d/c planning, home accessibility, OT/PT goals, and d/c planning  Therapy Documentation Precautions:  Precautions Precautions: Fall Restrictions Weight Bearing Restrictions: Yes RLE Weight Bearing: Weight bearing as tolerated   Therapy/Group: Individual Therapy  Madeliene Tejera L 05/19/2018, 7:05 AM

## 2018-05-19 NOTE — Progress Notes (Signed)
Physical Therapy Session Note  Patient Details  Name: Jenna Chambers MRN: [id number] Date of Birth: [birth year]  Today's Date: 05/19/2018 PT Individual Time: 0831-0905 PT Individual Time Calculation (min): 34 min   Short Term Goals: Week 1:  PT Short Term Goal 1 (Week 1): Pt will perform least restrictive transfers with min A consistently PT Short Term Goal 2 (Week 1): Pt will initiate stair training PT Short Term Goal 3 (Week 1): Pt will ambulate x 10 ft with LRAD and min A  Skilled Therapeutic Interventions/Progress Updates:    Patient in supine, reports just got TEDs placed this morning.  Not sure about new BP medication.  Patient supine for therex as noted below.  Performed supine to sit min A for R LE.  Patient ambulated 7' to w/c with RW and min A cues for leaning onto arms for taking weight off R LE if painful instead of hopping forward with L.  Patient in w/c propelled to dayroom with S increased time.  Plan for standing tolerance at hi-lo table, but pt related some dizziness,  BP measured as noted below.  Patient's walker adjusted 1 notch higher so stepped 2 steps forward and 2 steps back to see if helpful.  Patient assisted in w/c to her room and left with call bell and needs in reach.  Patient voiced concerns regarding pain medication due to took Vicodin one evening and slept very soundly, but difficult to wake up next day.  Not sure if something less strong she can take.  Plan to ask PA.  Orthostatic VS for the past 24 hrs (Last 3 readings):  BP- Sitting Pulse- Sitting BP- Standing at 0 minutes Pulse- Standing at 0 minutes BP- Standing at 3 minutes Pulse- Standing at 3 minutes  05/19/18 0900 157/58 58 98/69 68 109/58 66    Therapy Documentation Precautions:  Precautions Precautions: Fall Restrictions Weight Bearing Restrictions: Yes RLE Weight Bearing: Weight bearing as tolerated Pain: Pain Assessment Faces Pain Scale: Hurts even more Pain Type: Acute pain Pain Location:  Hip Pain Orientation: Right Pain Descriptors / Indicators: Aching;Sore Pain Onset: With Activity Pain Intervention(s): Repositioned Exercises: General Exercises - Lower Extremity Ankle Circles/Pumps: AROM;Both;20 reps;Supine Quad Sets: AROM;Right;10 reps;Supine Short Arc Quad: AROM;Right;10 reps;Supine Heel Slides: AAROM;Right;10 reps;Supine Hip ABduction/ADduction: AAROM;Right;10 reps;Supine    Therapy/Group: Individual Therapy  Reginia Naas  [place], PT 05/19/2018, 1:01 PM

## 2018-05-19 NOTE — Progress Notes (Signed)
Physical Therapy Session Note  Patient Details  Name: Jenna Chambers MRN: [id number] Date of Birth: 1931/08/03  Today's Date: 05/19/2018 PT Individual Time: 1330-1430 PT Individual Time Calculation (min): 60 min   Short Term Goals: Week 1:  PT Short Term Goal 1 (Week 1): Pt will perform least restrictive transfers with min A consistently PT Short Term Goal 2 (Week 1): Pt will initiate stair training PT Short Term Goal 3 (Week 1): Pt will ambulate x 10 ft with LRAD and min A  Skilled Therapeutic Interventions/Progress Updates:    Pt received seated in w/c in room, agreeable to PT session. Pt reports 7/10 pain in R hip, ice pack to R hip at end of session for pain management. Manual w/c propulsion x 150 ft with use of BUE and Supervision. Seated BP 152/46. Sit to stand with min A to RW. Standing RLE 1" step-taps x 5 reps with RW and min A before pt unable to continue due to RLE pain. Attempt standing LLE lifts, pt unable to tolerate standing on RLE. Ambulation x 10 ft with RW and min A, v/c for unweighting RLE with use of BUE on RW. Pt exhibits decreased assist needed with gait and transfers this date but continues to exhibit poor tolerance for WBing on RLE limiting her functional mobility. Sit to supine min A for RLE management. Pt left semi-reclined in bed with needs in reach, ice pack to R hip at end of session.  Therapy Documentation Precautions:  Precautions Precautions: Fall Restrictions Weight Bearing Restrictions: Yes RLE Weight Bearing: Weight bearing as tolerated   Therapy/Group: Individual Therapy   Excell Seltzer, PT, DPT  05/19/2018, 3:31 PM

## 2018-05-19 NOTE — Progress Notes (Signed)
[place] PHYSICAL MEDICINE & REHABILITATION PROGRESS NOTE   Subjective/Complaints: Right knee feels better. Right hip remains tender. Right leg still gives way during wb.   ROS: Patient denies fever, rash, sore throat, blurred vision, nausea, vomiting, diarrhea, cough, shortness of breath or chest pain, joint or back pain, headache, or mood change.    Objective:   No results found. No results for input(s): WBC, HGB, HCT, PLT in the last 72 hours. No results for input(s): NA, K, CL, CO2, GLUCOSE, BUN, CREATININE, CALCIUM in the last 72 hours.  Intake/Output Summary (Last 24 hours) at 05/19/2018 0925 Last data filed at 05/18/2018 1918 Gross per 24 hour  Intake 236 ml  Output -  Net 236 ml     Physical Exam: Vital Signs Blood pressure (!) 142/57, pulse (!) 59, temperature 98.5 F (36.9 C), temperature source Oral, resp. rate 19, height 5\' 3"  (1.6 m), weight 101.1 kg, last menstrual period 01/15/1982, SpO2 98 %. Constitutional: No distress . Vital signs reviewed. HEENT: EOMI, oral membranes moist Neck: supple Cardiovascular: RRR without murmur. No JVD    Respiratory: CTA Bilaterally without wheezes or rales. Normal effort    GI: BS +, non-tender, non-distended  Musc: Right hip with edema and tenderness  Neurological: She is alert.  Good insight and awareness.  Motor: Bilateral upper extremities: 5/5 proximal distal Left lower extremity: Hip flexion, knee extension 4-/5, ankle dorsiflexion 4+/5 Right lower extremity: Hip flexion and KE 2-/5 (pain),  ankle dorsiflexion/APF 4/5 --stable Skin:  Hip incision dressings intact Psychiatric: pleasant    Assessment/Plan: 1. Functional deficits secondary to right femoral neck fracture, endstage OA right knee which require 3+ hours per day of interdisciplinary therapy in a comprehensive inpatient rehab setting.  Physiatrist is providing close team supervision and 24 hour management of active medical problems listed  below.  Physiatrist and rehab team continue to assess barriers to discharge/monitor patient progress toward functional and medical goals  Care Tool:  Bathing    Body parts bathed by patient: Right arm, Left upper leg, Left arm, Chest, Abdomen, Face, Front perineal area, Right upper leg, Right lower leg, Left lower leg   Body parts bathed by helper: Buttocks     Bathing assist Assist Level: Minimal Assistance - Patient > 75%(LH sponge)     Upper Body Dressing/Undressing Upper body dressing   What is the patient wearing?: Hospital gown only    Upper body assist Assist Level: Contact Guard/Touching assist    Lower Body Dressing/Undressing Lower body dressing      What is the patient wearing?: Incontinence brief, Pants     Lower body assist Assist for lower body dressing: Moderate Assistance - Patient 50 - 74%     Toileting Toileting    Toileting assist Assist for toileting: Moderate Assistance - Patient 50 - 74%     Transfers Chair/bed transfer  Transfers assist     Chair/bed transfer assist level: Moderate Assistance - Patient 50 - 74%     Locomotion Ambulation   Ambulation assist      Assist level: Moderate Assistance - Patient 50 - 74% Assistive device: Walker-rolling Max distance: 2'   Walk 10 feet activity   Assist  Walk 10 feet activity did not occur: Safety/medical concerns        Walk 50 feet activity   Assist Walk 50 feet with 2 turns activity did not occur: Safety/medical concerns         Walk 150 feet activity   Assist Walk 150  feet activity did not occur: Safety/medical concerns         Walk 10 feet on uneven surface  activity   Assist Walk 10 feet on uneven surfaces activity did not occur: Safety/medical concerns         Wheelchair     Assist Will patient use wheelchair at discharge?: Yes Type of Wheelchair: Manual    Wheelchair assist level: Supervision/Verbal cueing Max wheelchair distance: 100'     Wheelchair 50 feet with 2 turns activity    Assist        Assist Level: Supervision/Verbal cueing   Wheelchair 150 feet activity     Assist           Medical Problem List and Plan: 1.  Decreased functional mobility secondary to comminuted angulated intertrochanteric right femur fracture. Status post IM nailing 05/10/2018. Weightbearing as tolerated.  Continue CIR PT, OT  -?presyncopal episodes this weekend---observe with therapies today 2.  Antithrombotics: -DVT/anticoagulation:  SCDs.   Vascular ultrasound negative for DVT             -antiplatelet therapy: Aspirin 81 mg daily 3. Pain Management: Hydrocodone   as needed. Pt wants to limit use  -continue voltaren gel to right knee  -ice  -DCed Robaxin, continueflexeril 5mg  q8 prn spasms  -continue scheduled tylenol 500mg  tid 4. Mood:  Provide emotional support             -antipsychotic agents: N/A  She requires positive reinforcement and encouragement. 5. Neuropsych: This patient is capable of making decisions on her own behalf. 6. Skin/Wound Care:  continue current dressings to right hip, remove tomorrow 7. Fluids/Electrolytes/Nutrition:  encourage po 8. Acute blood loss anemia.   hgb 10.4 on 4/30  Continue to monitor 9. Hypertension. Norvasc 10 mg daily, Cozaar 100 mg daily--changed cozaar to HS per home regimen  Labile on 5/2 10. Hypothyroidism. Synthroid 11.History of ulcerative colitis as well as colon cancer with resection. Continue Azulfidine 1000 mg twice a day 12. Diet-controlled diabetes mellitus. Blood sugar checks discontinued. Continue diabetic diet.  13. Constipation. Laxative assistance    Improving 14.  Morbid obesity: Encouraged appropriate dietary choices  LOS: 5 days A FACE TO [place] 05/19/2018, 9:25 AM

## 2018-05-20 ENCOUNTER — Inpatient Hospital Stay (HOSPITAL_COMMUNITY): Payer: Medicare Other | Admitting: Physical Therapy

## 2018-05-20 ENCOUNTER — Inpatient Hospital Stay (HOSPITAL_COMMUNITY): Payer: Medicare Other | Admitting: Occupational Therapy

## 2018-05-20 DIAGNOSIS — I951 Orthostatic hypotension: Secondary | ICD-10-CM

## 2018-05-20 MED ORDER — HYDROCODONE-ACETAMINOPHEN 5-325 MG PO TABS
0.5000 | ORAL_TABLET | Freq: Four times a day (QID) | ORAL | Status: DC | PRN
  Administered 2018-05-20 (×2): 0.5 via ORAL
  Administered 2018-05-21 (×2): 1 via ORAL
  Administered 2018-05-21: 0.5 via ORAL
  Administered 2018-05-22 – 2018-05-23 (×4): 1 via ORAL
  Administered 2018-05-24: 0.5 via ORAL
  Administered 2018-05-24 – 2018-05-26 (×5): 1 via ORAL
  Administered 2018-05-27: 0.5 via ORAL
  Administered 2018-05-27 – 2018-05-28 (×2): 1 via ORAL
  Administered 2018-05-29: 0.5 via ORAL
  Administered 2018-05-29: 1 via ORAL
  Administered 2018-05-30: 0.5 via ORAL
  Filled 2018-05-20 (×24): qty 1

## 2018-05-20 NOTE — Progress Notes (Signed)
Physical Therapy Session Note  Patient Details  Name: Jenna Chambers MRN: [id number] Date of Birth: 1931/10/28  Today's Date: 05/20/2018 PT Individual Time: 9528-4132 PT Individual Time Calculation (min): 60 min   Short Term Goals: Week 1:  PT Short Term Goal 1 (Week 1): Pt will perform least restrictive transfers with min A consistently PT Short Term Goal 2 (Week 1): Pt will initiate stair training PT Short Term Goal 3 (Week 1): Pt will ambulate x 10 ft with LRAD and min A  Skilled Therapeutic Interventions/Progress Updates:    Pt received supine in bed and agreeable to therapy session. Pt performed supine to sit, HOB elevated and using bedrails, with min assist and significantly increased time. Performs sit<>stand from w/c/EOM/EOB with min assist for lifting throughout session - cuing for increased hip/trunk extension for upright. Pt performed stand pivot transfers EOB<>w/c<>EOM using RW with min assist/CGA for balance/steadying - pt continues to limit R LE weightbearing by hopping on L LE, cuing for R foot flat and encouraged to put minimal weight with remainder through [place]. Performed ~13ft B UE w/c propulsion with supervision. Performed standing with B UE support on RW repeated R LE step up on 4" step 2 sets of 3-4 repetitions with pain limiting - attempted repeated hip flexion without step with pt continuing to report same pain as with step - pain occurs on contact with ground/step when weightbearing is initiated. Performed 2 x 10 repetitions standing with RW R lateral weightshifts with sustained weightbearing for 10 seconds focusing on habituation and pain tolerance of weightbearing through R LE - mirror feedback and tactile cuing provided for shifting - pt reports only "pressure" and no pain during this activity. Performed sit to supine with min/mod assist for R LE management. Performed 2 sets of 10 R LE heel slides with tactile cuing to stay within pain free ROM. Performed supine to sit with  increased time and mod/max assist for trunk upright. Performed seated R LE long arch quads focusing on quadriceps activation 2 sets of 10 with cuing for sustained isometric contraction at end ROM. Stand pivot transfer using RW back to w/c with min assist. Transported back to room in w/c for time management. Pt left sitting in w/c with needs in reach, seat belt alarm on, and meal tray set-up.   Therapy Documentation Precautions:  Precautions Precautions: Fall Restrictions Weight Bearing Restrictions: Yes RLE Weight Bearing: Weight bearing as tolerated  Pain: Reports recent pain medication administration approximately 30 minutes prior to therapy session. During session pt reports "sharp" pain when putting weight through R LE followed by a "residual ache" upon taking away the weight. Reports pain located in posterolateral hip and down closer to her knee this session. Pain rated at rest 4/10 and increases to 8-9/10 with weightbearing. Modified exercises and limited weightbearing for pain management.   Therapy/Group: Individual Therapy  Tawana Scale, PT, DPT  05/20/2018, 3:27 PM

## 2018-05-20 NOTE — Progress Notes (Signed)
Physical Therapy Session Note  Patient Details  Name: Jenna Chambers MRN: [id number] Date of Birth: 06-18-1931  Today's Date: 05/20/2018 PT Individual Time: 1000-1100; 1430-1510 PT Individual Time Calculation (min): 60 min and 40 min  Short Term Goals: Week 1:  PT Short Term Goal 1 (Week 1): Pt will perform least restrictive transfers with min A consistently PT Short Term Goal 2 (Week 1): Pt will initiate stair training PT Short Term Goal 3 (Week 1): Pt will ambulate x 10 ft with LRAD and min A  Skilled Therapeutic Interventions/Progress Updates:    Session 1: Pt received seated in w/c in room, agreeable to PT session. Pt reports 6/10 pain in R hip, declines any intervention. Manual w/c propulsion x 150 ft with use of BUE and Supervision. Trialed pt in 18x18 w/c for improved fit through home environment. Pt fits into 18x18 and will use for next several days to determine comfort level with R hip. Sit to stand with min A to RW. Ambulation 2 x 10 ft with RW and min A with standing rest breaks due to fatigue and R hip pain. Ascend/descend one 3" step with B handrails and max A to maintain standing balance. Pt continues to exhibit poor tolerance for WBing on RLE. Pt left seated in w/c in room with needs in reach at end of session.  Session 2: Pt received supine in bed, agreeable to PT session. Pt reports pain in R hip, not rated and just received pain medication. Pt requesting to use the bathroom. Supine to sit with SBA with increased time needed for BLE management. Stand pivot transfer bed to w/c with RW and min A. Toilet transfer with RW and min A. Pt is setup A for 3/3 toileting steps. Assisted pt with calling [name] to obtain measurements of patient's home environment. Provided pt's niece with a list of areas to obtain measurements of, will follow up tomorrow to obtain measurements. Pt requesting to return to bed at end of session. Stand pivot transfer with min A and RW. Sit to supine min A  for RLE management. Pt left supine in bed with needs in reach, bed alarm in place.   Therapy Documentation Precautions:  Precautions Precautions: Fall Restrictions Weight Bearing Restrictions: Yes RLE Weight Bearing: Weight bearing as tolerated    Therapy/Group: Individual Therapy   Excell Seltzer, PT, DPT  05/20/2018, 1:22 PM

## 2018-05-20 NOTE — Patient Care Conference (Cosign Needed)
Inpatient RehabilitationTeam Conference and Plan of Care Update Date: 05/20/2018   Time: 2:20 PM    Patient Name: Jenna Chambers      Medical Record Number: [id number]  Date of Birth: 1931/02/06 Sex: Female         Room/Bed: 4W12C/4W12C-01 Payor Info: Payor: [place] / Plan: BCBS MEDICARE / Product Type: *No Product type* /    Admitting Diagnosis: hip fx  Admit Date/Time:  05/14/2018  4:35 PM Admission Comments: No comment available   Primary Diagnosis:  <principal problem not specified> Principal Problem: <principal problem not specified>  Patient Active Problem List   Diagnosis Date Noted  . Slow transit constipation   . Labile blood pressure   . Postoperative pain   . Displaced fracture of right femoral neck ([place]) 05/14/2018  . Essential hypertension   . Hypothyroidism   . Acute blood loss anemia 05/13/2018  . Obesity, Class III, BMI 40-49.9 (morbid obesity) ([place]) 05/13/2018  . Hip fracture ([place]) 05/10/2018  . Colon cancer ([place]) 02/27/2013  . Essential hypertension, benign 02/27/2013  . Glaucoma 02/27/2013  . Ulcerative colitis ([place]) 02/27/2013  . Diabetes [facility])     Expected Discharge Date: Expected Discharge Date: 05/30/18  Team Members Present: Physician leading conference: Dr. Alger Simons Social Worker Present: Alfonse Alpers, LCSW Nurse Present: Dwaine Gale, RN PT Present: Other (comment)(Taylor Ervin Knack, PT) OT Present: Amy Rounds, OT SLP Present: Weston Anna, SLP PPS Coordinator present : Gunnar Fusi     Current Status/Progress Goal Weekly Team Focus  Medical   right FNF, OA right knee, orthostasis, pain issues  improve functional issues  pain, orthostasis, wound care   Bowel/Bladder   Continent of B/B, LBM 05/19/18  Remain continent   Assess q-shuft/PRN, laxatives   Swallow/Nutrition/ Hydration             ADL's   Min-mod A sit>stand, mod A toileting, Set-up UB bathing/dressing and grooming from w/c evel; min A LB dressing  using AE  Supervision overall  Functional mobility, ADL re-training, functional transfers, activity tolerance   Mobility   min A bed mobility, min A transfers, min A gait up to 10' with RW, Supervision w/c mobility  mod I at w/c level  transfers, gait, standing tolerance   Communication             Safety/Cognition/ Behavioral Observations            Pain   R Hip Surgical Pain; Relived with sch tylenol, PRN Norco; 5 /10 pain  Pain </= 3/10  assess for pain, medicate as ordered. assess for relief, notify MD if not effective.    Skin   Surgical incision w/minimal drainange, R Lateral Thigh ecchymosis   Surgical incision healed, no signs of infection.  Assess incision Q-Shift/ PRN    Rehab Goals Patient on target to meet rehab goals: Yes Rehab Goals Revised: none *See Care Plan and progress notes for long and short-term goals.     Barriers to Discharge  Current Status/Progress Possible Resolutions Date Resolved   Physician    Medical stability;Wound Care        see MD progress notes      Nursing                  PT  Decreased caregiver support;Medical stability;Home environment access/layout  4 STE with one handrail, lives alone              OT  SLP                SW                Discharge Planning/Teaching Needs:  Pt hopes to return to her home with assistance from her niece and paid caregivers.  Family education will be offered to the niece and/or caregivers, as needed.   Team Discussion:  Pt with femoral neck fx and has chronic arthritis in her knee.  Pt has some orthostasis with standing.  Her blood sugars are controlled and pain is improved.  Pt prefers 1/2 norco instead of full in between her tylenol.  Pt is min A/occasionally mod A with fatigue.  She needs extra time and rest breaks.  Vitals are ok with TEDs.  Pt has mod I level goals.  PT needs min A for txs and pain in R hip limits her to 10' ambulation at a time and then leg gives out.  She did one  3" stair and then needed to rest.  Pt will likely need a ramp at home and will need w/c at home.  PT to talk to niece about set up of home and home measurements and then CSW will f/u with pt/niece regarding their d/c plans.  Revisions to Treatment Plan:  none    Continued Need for Acute Rehabilitation Level of Care: The patient requires daily medical management by a physician with specialized training in physical medicine and rehabilitation for the following conditions: Daily direction of a multidisciplinary physical rehabilitation program to ensure safe treatment while eliciting the highest outcome that is of practical value to the patient.: Yes Daily medical management of patient stability for increased activity during participation in an intensive rehabilitation regime.: Yes Daily analysis of laboratory values and/or radiology reports with any subsequent need for medication adjustment of medical intervention for : Wound care problems;Post surgical problems;Cardiac problems   I attest that I was present, lead the team conference, and concur with the assessment and plan of the team.Team conference was held via web/ teleconference due to [place] - 19.   Jenna Chambers, [name] 05/20/2018, 2:34 PM

## 2018-05-20 NOTE — Plan of Care (Signed)
  Problem: Consults Goal: RH GENERAL PATIENT EDUCATION Description See Patient Education module for education specifics. Outcome: Progressing   Problem: RH BOWEL ELIMINATION Goal: RH STG MANAGE BOWEL WITH ASSISTANCE Description STG Manage Bowel with Mod I Assistance.  Outcome: Progressing   Problem: RH SKIN INTEGRITY Goal: RH STG SKIN FREE OF INFECTION/BREAKDOWN Description No new breakdown with min assist   Outcome: Progressing Goal: RH STG ABLE TO PERFORM INCISION/WOUND CARE W/ASSISTANCE Description STG Able To Perform Incision/Wound Care With [employer].  Outcome: Progressing   Problem: RH SAFETY Goal: RH STG ADHERE TO SAFETY PRECAUTIONS W/ASSISTANCE/DEVICE Description STG Adhere to Safety Precautions With min Assistance/Device.  Outcome: Progressing   Problem: RH PAIN MANAGEMENT Goal: RH STG PAIN MANAGED AT OR BELOW PT'S PAIN GOAL Description <3 out of 10.   Outcome: Progressing

## 2018-05-20 NOTE — Progress Notes (Signed)
Occupational Therapy Session Note  Patient Details  Name: Jenna Chambers MRN: [id number] Date of Birth: May 13, 1931  Today's Date: 05/20/2018 OT Individual Time: 0730-0830 OT Individual Time Calculation (min): 60 min    Short Term Goals: Week 1:  OT Short Term Goal 1 (Week 1): Pt will don pants with steadying assist using AE PRN OT Short Term Goal 2 (Week 1): Pt will consistently complete stand pivot transfer with min A using LRAD in prep for functional task OT Short Term Goal 3 (Week 1): Pt will stand to complete 1 grooming task with CGA to increase functional standing balance/endurance  Skilled Therapeutic Interventions/Progress Updates:    Pt seen forOT ADL Bathing/dressing session. Pt eating breakfast sitting upright in bed upon arrival, agreeable to tx session. She rated pain as "medium",however, reports being pre-medicated prior to tx session  And agreeable to tx session without intervention.  She transferred to sitting EOB with tactile cuing/ VCs for technique from flat bed in simulation of home environment. She declined bathing this session, opting to only change clothes. She dressed seated EOB. She was able to thread B LEs into pants using reacher,recalling education taught in previous sessions. She stood from EOB with CGA to RW and pulled pants up with steadying assist. UB dressing completed with set-up seated EOB.  She required min A to stand from EOB to RW and complete stand pivot transfer to w/c with significantly increased time required and VCs for deep breathing throughout transfer process.  She completed grooming tasks from w/c level at sink with set-up.  She self propelled w/c throughout unit using B UEs for UE strengthening and activity tolerance, increased time and rest break required. Completed dynamic standing activity utilizing [employer], standing at RW and hitting targets across midline to facilitate weight shift and dynamic balance. Pt completed x2 1 minute trials with seated  rest break required after 1 minute of activity. CGA for balance during dynamic tasks.  Pt provided with home measurement sheet and cont discussion regarding home accessibility from w/c level. Will work with pt and her niece of completing measurement sheet.  Pt returned to room at end of session, min A stand pivot transfer to recliner. Pt left seated in recliner at end of session, all needs in reach.   Therapy Documentation Precautions:  Precautions Precautions: Fall Restrictions Weight Bearing Restrictions: Yes RLE Weight Bearing: Weight bearing as tolerated    Therapy/Group: Individual Therapy  Lavon Bothwell L 05/20/2018, 6:57 AM

## 2018-05-20 NOTE — Progress Notes (Signed)
[facility] PHYSICAL MEDICINE & REHABILITATION PROGRESS NOTE   Subjective/Complaints: No new complaints. Did ask that she only receive 1/2 vicodin for pain as the whole tab causes her to be groggy. Also noted that her bp was low again with therapy yesterday.  Abd binder causes pressure on her HH  ROS: Patient denies fever, rash, sore throat, blurred vision, nausea, vomiting, diarrhea, cough, shortness of breath or chest pain,   headache, or mood change.    Objective:   No results found. No results for input(s): WBC, HGB, HCT, PLT in the last 72 hours. No results for input(s): NA, K, CL, CO2, GLUCOSE, BUN, CREATININE, CALCIUM in the last 72 hours.  Intake/Output Summary (Last 24 hours) at 05/20/2018 0858 Last data filed at 05/20/2018 0700 Gross per 24 hour  Intake 360 ml  Output -  Net 360 ml     Physical Exam: Vital Signs Blood pressure (!) 157/65, pulse 60, temperature (!) 97.4 F (36.3 C), temperature source Oral, resp. rate 18, height 5\' 3"  (1.6 m), weight 101.1 kg, last menstrual period 01/15/1982, SpO2 97 %. Constitutional: No distress . Vital signs reviewed. HEENT: EOMI, oral membranes moist Neck: supple Cardiovascular: RRR without murmur. No JVD    Respiratory: CTA Bilaterally without wheezes or rales. Normal effort    GI: BS +, non-tender, non-distended  Musc: Right hip with edema and tenderness  Neurological: She is alert.  normal insight and awareness.  Motor: Bilateral upper extremities: 5/5 proximal distal Left lower extremity: Hip flexion, knee extension 4-/5, ankle dorsiflexion 4+/5 Right lower extremity: Hip flexion and KE 2-/5 (pain),  ankle dorsiflexion/APF 4/5 --stable motor exam Skin:  Hip incision dressings intact, dressing in place Psychiatric: pleasant    Assessment/Plan: 1. Functional deficits secondary to right femoral neck fracture, endstage OA right knee which require 3+ hours per day of interdisciplinary therapy in a comprehensive inpatient rehab  setting.  Physiatrist is providing close team supervision and 24 hour management of active medical problems listed below.  Physiatrist and rehab team continue to assess barriers to discharge/monitor patient progress toward functional and medical goals  Care Tool:  Bathing    Body parts bathed by patient: Right arm, Left upper leg, Left arm, Chest, Abdomen, Face, Front perineal area, Right upper leg, Right lower leg, Left lower leg   Body parts bathed by helper: Buttocks     Bathing assist Assist Level: Minimal Assistance - Patient > 75%(LH sponge)     Upper Body Dressing/Undressing Upper body dressing   What is the patient wearing?: Bra, Pull over shirt    Upper body assist Assist Level: Set up assist    Lower Body Dressing/Undressing Lower body dressing      What is the patient wearing?: Pants     Lower body assist Assist for lower body dressing: Contact Guard/Touching assist     Toileting Toileting    Toileting assist Assist for toileting: Moderate Assistance - Patient 50 - 74%     Transfers Chair/bed transfer  Transfers assist     Chair/bed transfer assist level: Minimal Assistance - Patient > 75%     Locomotion Ambulation   Ambulation assist      Assist level: Minimal Assistance - Patient > 75% Assistive device: Walker-rolling Max distance: 10'   Walk 10 feet activity   Assist  Walk 10 feet activity did not occur: Safety/medical concerns  Assist level: Minimal Assistance - Patient > 75% Assistive device: Walker-rolling   Walk 50 feet activity   Assist Walk  50 feet with 2 turns activity did not occur: Safety/medical concerns         Walk 150 feet activity   Assist Walk 150 feet activity did not occur: Safety/medical concerns         Walk 10 feet on uneven surface  activity   Assist Walk 10 feet on uneven surfaces activity did not occur: Safety/medical concerns         Wheelchair     Assist Will patient use  wheelchair at discharge?: Yes Type of Wheelchair: Manual    Wheelchair assist level: Supervision/Verbal cueing Max wheelchair distance: 150'    Wheelchair 50 feet with 2 turns activity    Assist        Assist Level: Supervision/Verbal cueing   Wheelchair 150 feet activity     Assist     Assist Level: Supervision/Verbal cueing     Medical Problem List and Plan: 1.  Decreased functional mobility secondary to comminuted angulated intertrochanteric right femur fracture. Status post IM nailing 05/10/2018. Weightbearing as tolerated.  Continue CIR PT, OT  -?presyncopal episodes this weekend---observe with therapies today 2.  Antithrombotics: -DVT/anticoagulation:  SCDs.   Vascular ultrasound negative for DVT             -antiplatelet therapy: Aspirin 81 mg daily 3. Pain Management: Hydrocodone   as needed. Pt wants to limit use  -continue voltaren gel to right knee  -ice  -DCed Robaxin, continueflexeril 5mg  q8 prn spasms  -continue scheduled tylenol 500mg  tid 4. Mood:  Provide emotional support             -antipsychotic agents: N/A  She requires positive reinforcement and encouragement. 5. Neuropsych: This patient is capable of making decisions on her own behalf. 6. Skin/Wound Care:  continue current dressings to right hip, remove tomorrow 7. Fluids/Electrolytes/Nutrition:  encourage po 8. Acute blood loss anemia.   hgb 10.4 on 4/30  Continue to monitor 9. Hypertension. Norvasc 10 mg daily, Cozaar 100 mg daily--changed cozaar to HS per home regimen  Pt is experiencing orthostasis in standing  -TEDs, Abd binder if tolerated  Resting bp's actually a little high  -encouraged proper fluid intake and acclimation  -discuss further with therapy in team conference today 10. Hypothyroidism. Synthroid 11.History of ulcerative colitis as well as colon cancer with resection. Continue Azulfidine 1000 mg twice a day 12. Diet-controlled diabetes mellitus. Blood sugar checks  discontinued. Continue diabetic diet.  13. Constipation. Laxative assistance    Improving 14.  Morbid obesity: Encouraged appropriate dietary choices  LOS: 6 days A FACE TO FACE EVALUATION WAS PERFORMED  Meredith Staggers 05/20/2018, 8:58 AM

## 2018-05-21 ENCOUNTER — Inpatient Hospital Stay (HOSPITAL_COMMUNITY): Payer: Medicare Other | Admitting: *Deleted

## 2018-05-21 ENCOUNTER — Inpatient Hospital Stay (HOSPITAL_COMMUNITY): Payer: Medicare Other | Admitting: Physical Therapy

## 2018-05-21 ENCOUNTER — Inpatient Hospital Stay (HOSPITAL_COMMUNITY): Payer: Medicare Other | Admitting: Occupational Therapy

## 2018-05-21 NOTE — Progress Notes (Signed)
Physical Therapy Session Note  Patient Details  Name: Jenna Chambers MRN: [id number] Date of Birth: 1931-09-19  Today's Date: 05/21/2018 PT Individual Time: 1000-1115 PT Individual Time Calculation (min): 75 min   Short Term Goals: Week 1:  PT Short Term Goal 1 (Week 1): Pt will perform least restrictive transfers with min A consistently PT Short Term Goal 2 (Week 1): Pt will initiate stair training PT Short Term Goal 3 (Week 1): Pt will ambulate x 10 ft with LRAD and min A  Skilled Therapeutic Interventions/Progress Updates:    Pt received seated in w/c in room, agreeable to PT session. Pt reports 6/10 pain in R hip, ice pack to R hip at end of session for pain management. Manual w/c propulsion x 150 ft with use of BUE and Supervision. Sit stand with min A to RW. Ambulation x 10', x 7' with RW and min A. Attempted distraction techniques during gait but pt remains limited to standing tolerance by pain in RLE. Ascend/descend one 3" step with B handrails and max A for standing balance. Discussed pt's home setup and pt's niece able to provide home measurements. Squat pivot transfer w/c to/from level mat table with min A progressing to CGA throughout session, x 4 reps each direction. Pt reports some soreness in L pectoral region following session, ice pack applied there as well. Pt left seated in w/c in room with needs in reach at end of session.  Therapy Documentation Precautions:  Precautions Precautions: Fall Restrictions Weight Bearing Restrictions: Yes RLE Weight Bearing: Weight bearing as tolerated    Therapy/Group: Individual Therapy   Excell Seltzer, PT, DPT  05/21/2018, 1:17 PM

## 2018-05-21 NOTE — Evaluation (Signed)
Recreational Therapy Assessment and Plan  Patient Details  Name: Jenna Chambers MRN: [id number] Date of Birth: 10-27-1931 Today's Date: 05/21/2018  Rehab Potential:  Good ELOS:     Assessment  Problem List:      Patient Active Problem List   Diagnosis Date Noted  . Displaced fracture of right femoral neck ([place]) 05/14/2018  . Essential hypertension   . Hypothyroidism   . Acute blood loss anemia 05/13/2018  . Obesity, Class III, BMI 40-49.9 (morbid obesity) ([place]) 05/13/2018  . Hip fracture ([place]) 05/10/2018  . Colon cancer ([place]) 02/27/2013  . Essential hypertension, benign 02/27/2013  . Glaucoma 02/27/2013  . Ulcerative colitis ([place]) 02/27/2013  . Diabetes [facility])     Past Medical History:      Past Medical History:  Diagnosis Date  . Colon cancer ([place])    resecetion  . Diabetes ([place])    Type 2  . Glaucoma    --both eyes per patient getting worse  . Gout   . Hypertension   . Osteopenia   . Sleep apnea    no CPAP  . Ulcerative colitis [facility])    Past Surgical History:       Past Surgical History:  Procedure Laterality Date  . BILATERAL SALPINGOOPHORECTOMY  1993   serous cystadenoma  . CATARACT EXTRACTION  2006, 2008   gluacoma  . EXPLORATORY LAPAROTOMY  1996   incarcerated hernia  . INTRAMEDULLARY (IM) NAIL INTERTROCHANTERIC Right 05/10/2018   Procedure: INTRAMEDULLARY (IM) NAIL INTERTROCHANTRIC;  Surgeon: [name], MD;  Location: [place];  Service: Orthopedics;  Laterality: Right;  . PARTIAL COLECTOMY  1991  . TOTAL KNEE ARTHROPLASTY Left 2010    Assessment & Plan Clinical Impression: Jenna Chambers is an 85 year old right-handed female with history of colon cancer with resection, diet controlled diabetes mellitus,,hypertension, ulcerated colitis. Per chart review and patient, she lives alone. One level home 4 steps to entry. Independent using a straight point cane for community ambulation. Independent with ADLs. Presented on  05/10/2018 after a fall. She reportedly fell backward trying to step on a bug. No loss of consciousness. Sustained comminuted and angulated intertrochanteric right femur fracture.she underwent IM nailing on 05/10/2018 per Dr. Victorino December. Weightbearing as tolerated. Hospital course complicated by pain management and acute blood loss anemia, hemoglobin 9.7-10.7 and monitored. Maintained on aspirin for DVT prophylaxis.Therapy evaluations completed and patient was admitted for a comprehensive rehabilitation program.  Patient transferred to CIR on 05/14/2018 .    Met with pt today to discuss TR services.  Pt tired from previous sessions during this visit.  Offered and provided diversional activities in the room for use as needed.  No further TR.  Will monitor through team.    Plan No further TR.  Leisure Activities provided in the room for use as needed. Recommendations for other services: None   Discharge Criteria: Patient will be discharged from TR if patient refuses treatment 3 consecutive times without medical reason.  If treatment goals not met, if there is a change in medical status, if patient makes no progress towards goals or if patient is discharged from hospital.  The above assessment, treatment plan, treatment alternatives and goals were discussed and mutually agreed upon: by patient  [place] 05/21/2018, 4:06 PM

## 2018-05-21 NOTE — Progress Notes (Signed)
Occupational Therapy Session Note  Patient Details  Name: Jenna Chambers MRN: [id number] Date of Birth: 1931/06/21  Today's Date: 05/21/2018 OT Individual Time: 8333-8329 OT Individual Time Calculation (min): 60 min    Short Term Goals: Week 1:  OT Short Term Goal 1 (Week 1): Pt will don pants with steadying assist using AE PRN OT Short Term Goal 2 (Week 1): Pt will consistently complete stand pivot transfer with min A using LRAD in prep for functional task OT Short Term Goal 3 (Week 1): Pt will stand to complete 1 grooming task with CGA to increase functional standing balance/endurance      Skilled Therapeutic Interventions/Progress Updates:    Pt seen for ADL training with a focus on tolerance to movement and standing balance.  Pt did quite well today with all her mobility only needing min A to CGA with sit to stands and stand pivot transfers bed to w/c, w/c to toilet, ambulating 5 steps to shower, shower to w/c.  She continues to have increased pain with weight through R leg so cued her to push up more with her arms on RW.   In shower used long sponge to bath with S, in chair used reacher to don pants with CGA.  In standing pt attempted to let go with B hands on RW to pull pants up but it caused a minor LOB.  She is safer with alternating hands.  Pt resting in wc at end of session with belt alarm on and completing grooming at the sink.   Therapy Documentation Precautions:  Precautions Precautions: Fall Restrictions Weight Bearing Restrictions: Yes RLE Weight Bearing: Weight bearing as tolerated  Pain: Pain Assessment Pain Scale: 0-10 Pain Score: 6  Pain Type: Acute pain;Surgical pain Pain Location: Hip Pain Orientation: Right Pain Radiating Towards: leg,thigh muscle Pain Descriptors / Indicators: Aching Pain Frequency: Intermittent Pain Onset: With Activity Pain Intervention(s): Rest Multiple Pain Sites: No   Therapy/Group: Individual Therapy  [place] 05/21/2018, 8:50  AM

## 2018-05-21 NOTE — Progress Notes (Signed)
Occupational Therapy Session Note  Patient Details  Name: Jenna Chambers MRN: [id number] Date of Birth: 1931/08/16  Today's Date: 05/21/2018 OT Individual Time: 1400-1500 OT Individual Time Calculation (min): 60 min    Short Term Goals: Week 1:  OT Short Term Goal 1 (Week 1): Pt will don pants with steadying assist using AE PRN OT Short Term Goal 2 (Week 1): Pt will consistently complete stand pivot transfer with min A using LRAD in prep for functional task OT Short Term Goal 3 (Week 1): Pt will stand to complete 1 grooming task with CGA to increase functional standing balance/endurance  Skilled Therapeutic Interventions/Progress Updates:    Pt seen for OT session focusing on functional transfers and education. Pt sitting up in w/c upon arrival, agreeable to tx session. Rated pain 7/10 in R hip, ice already applied. Pt due for pain meds at 2:30 and RN administered pain meds at that time during session. Repositioned and rest provided throughout session.  Pt provided therapist with home measurement sheet. She will not be able to access bathroom from w/c level. Therefore, discussed use of BSC and pt completed stand pivot transfer w/c<> BSC with steadying assist using RW with significantly increased time 2/2 pain.  Education and demonstration provided for w/c parts management, pt able to take off leg rests, however, requires more assist to place leg rests on.  She self propelled w/c throughout unit using B UEs. Attempted to have pt peddle with B LEs, however, pt unable to tolerate 2/2 pain with hip flexion to advance foot.  In ADL apartment, completed stand pivot transfer w/c> low soft surface recliner in simulation of chair at home. Min A for sit>stand from w/c, however, mod A required to stand from low recliner. Pt returned back to room total A in w/c. Min stand pivot to EOB and mod A for management of B LEs to return to supine. Pt left in supine at end of session with all needs in reach and bed alarm  on.   Therapy Documentation Precautions:  Precautions Precautions: Fall Restrictions Weight Bearing Restrictions: Yes RLE Weight Bearing: Weight bearing as tolerated   Therapy/Group: Individual Therapy  [name] L 05/21/2018, 7:04 AM

## 2018-05-21 NOTE — Progress Notes (Signed)
[facility] PHYSICAL MEDICINE & REHABILITATION PROGRESS NOTE   Subjective/Complaints: Right hip still painful with wb. Otherwise she's doing fairly well. Tolerates 1/2 hydrocodone much better  ROS: Patient denies fever, rash, sore throat, blurred vision, nausea, vomiting, diarrhea, cough, shortness of breath or chest pain,  headache, or mood change.    Objective:   No results found. No results for input(s): WBC, HGB, HCT, PLT in the last 72 hours. No results for input(s): NA, K, CL, CO2, GLUCOSE, BUN, CREATININE, CALCIUM in the last 72 hours.  Intake/Output Summary (Last 24 hours) at 05/21/2018 0859 Last data filed at 05/20/2018 2057 Gross per 24 hour  Intake 360 ml  Output -  Net 360 ml     Physical Exam: Vital Signs Blood pressure (!) 147/52, pulse 61, temperature 97.9 F (36.6 C), temperature source Oral, resp. rate 16, height 5\' 3"  (1.6 m), weight 101.1 kg, last menstrual period 01/15/1982, SpO2 94 %. Constitutional: No distress . Vital signs reviewed. HEENT: EOMI, oral membranes moist Neck: supple Cardiovascular: RRR without murmur. No JVD    Respiratory: CTA Bilaterally without wheezes or rales. Normal effort    GI: BS +, non-tender, non-distended  Musc: Right hip with edema and tenderness to ROM Neurological: She is alert.  normal insight and awareness.  Motor: Bilateral upper extremities: 5/5 proximal distal Left lower extremity: Hip flexion, knee extension 4-/5, ankle dorsiflexion 4+/5 Right lower extremity: Hip flexion and KE 2-/5 (pain),  ankle dorsiflexion/APF 4/5 --motor exam stable Skin:  Hip incision dressings intact with staples, dressings removed.  Psychiatric: pleasant    Assessment/Plan: 1. Functional deficits secondary to right femoral neck fracture, endstage OA right knee which require 3+ hours per day of interdisciplinary therapy in a comprehensive inpatient rehab setting.  Physiatrist is providing close team supervision and 24 hour management of  active medical problems listed below.  Physiatrist and rehab team continue to assess barriers to discharge/monitor patient progress toward functional and medical goals  Care Tool:  Bathing    Body parts bathed by patient: Right arm, Left upper leg, Left arm, Chest, Abdomen, Face, Front perineal area, Right upper leg, Right lower leg, Left lower leg   Body parts bathed by helper: Buttocks     Bathing assist Assist Level: Minimal Assistance - Patient > 75%(LH sponge)     Upper Body Dressing/Undressing Upper body dressing   What is the patient wearing?: Bra, Pull over shirt    Upper body assist Assist Level: Set up assist    Lower Body Dressing/Undressing Lower body dressing      What is the patient wearing?: Pants     Lower body assist Assist for lower body dressing: Contact Guard/Touching assist     Toileting Toileting    Toileting assist Assist for toileting: Minimal Assistance - Patient > 75%     Transfers Chair/bed transfer  Transfers assist     Chair/bed transfer assist level: Minimal Assistance - Patient > 75%     Locomotion Ambulation   Ambulation assist      Assist level: Minimal Assistance - Patient > 75% Assistive device: Walker-rolling Max distance: 10'   Walk 10 feet activity   Assist  Walk 10 feet activity did not occur: Safety/medical concerns  Assist level: Minimal Assistance - Patient > 75% Assistive device: Walker-rolling   Walk 50 feet activity   Assist Walk 50 feet with 2 turns activity did not occur: Safety/medical concerns         Walk 150 feet activity  Assist Walk 150 feet activity did not occur: Safety/medical concerns         Walk 10 feet on uneven surface  activity   Assist Walk 10 feet on uneven surfaces activity did not occur: Safety/medical concerns         Wheelchair     Assist Will patient use wheelchair at discharge?: Yes Type of Wheelchair: Manual    Wheelchair assist level:  Supervision/Verbal cueing Max wheelchair distance: 128ft    Wheelchair 50 feet with 2 turns activity    Assist        Assist Level: Supervision/Verbal cueing   Wheelchair 150 feet activity     Assist     Assist Level: Supervision/Verbal cueing     Medical Problem List and Plan: 1.  Decreased functional mobility secondary to comminuted angulated intertrochanteric right femur fracture. Status post IM nailing 05/10/2018. Weightbearing as tolerated.  Continue CIR PT, OT  2.  Antithrombotics: -DVT/anticoagulation:  SCDs.   Vascular ultrasound negative for DVT             -antiplatelet therapy: Aspirin 81 mg daily 3. Pain Management: Hydrocodone   as needed. Pt wants to limit use  -continue voltaren gel to right knee  -ice scheduled to right hip  -DCed Robaxin, continueflexeril 5mg  q8 prn spasms  -continue scheduled tylenol 500mg  tid 4. Mood:  Provide emotional support             -antipsychotic agents: N/A  She requires positive reinforcement and encouragement. 5. Neuropsych: This patient is capable of making decisions on her own behalf. 6. Skin/Wound Care:  continue current dressings to right hip, remove tomorrow 7. Fluids/Electrolytes/Nutrition:  encourage po 8. Acute blood loss anemia.   hgb 10.4 on 4/30  Continue to monitor 9. Hypertension. Norvasc 10 mg daily, Cozaar 100 mg daily--changed cozaar to HS per home regimen  orthostasis better yesterday. No abd binder needed  -TEDs, Abd binder if tolerated  Resting bp's actually a little high  -encouraged proper fluid intake and acclimation  -hopeful will continue resolve on it's own 10. Hypothyroidism. Synthroid 11.History of ulcerative colitis as well as colon cancer with resection. Continue Azulfidine 1000 mg twice a day 12. Diet-controlled diabetes mellitus. Blood sugar checks discontinued. Continue diabetic diet.  13. Constipation. Laxative assistance    Improving. Moved bowels 5/4 14.  Morbid obesity:  Encouraged appropriate dietary choices  LOS: 7 days A FACE TO [place] 05/21/2018, 8:59 AM

## 2018-05-21 NOTE — Plan of Care (Signed)
  Problem: Consults Goal: RH GENERAL PATIENT EDUCATION Description See Patient Education module for education specifics. Outcome: Progressing   Problem: RH BOWEL ELIMINATION Goal: RH STG MANAGE BOWEL WITH ASSISTANCE Description STG Manage Bowel with Mod I Assistance.  Outcome: Progressing   Problem: RH SKIN INTEGRITY Goal: RH STG SKIN FREE OF INFECTION/BREAKDOWN Description No new breakdown with min assist   Outcome: Progressing Goal: RH STG ABLE TO PERFORM INCISION/WOUND CARE W/ASSISTANCE Description STG Able To Perform Incision/Wound Care With [employer].  Outcome: Progressing   Problem: RH SAFETY Goal: RH STG ADHERE TO SAFETY PRECAUTIONS W/ASSISTANCE/DEVICE Description STG Adhere to Safety Precautions With min Assistance/Device.  Outcome: Progressing   Problem: RH PAIN MANAGEMENT Goal: RH STG PAIN MANAGED AT OR BELOW PT'S PAIN GOAL Description <3 out of 10.   Outcome: Progressing

## 2018-05-22 ENCOUNTER — Inpatient Hospital Stay (HOSPITAL_COMMUNITY): Payer: Medicare Other | Admitting: Physical Therapy

## 2018-05-22 ENCOUNTER — Inpatient Hospital Stay (HOSPITAL_COMMUNITY): Payer: Medicare Other | Admitting: Occupational Therapy

## 2018-05-22 MED ORDER — SULFASALAZINE 500 MG PO TBEC
1000.0000 mg | DELAYED_RELEASE_TABLET | Freq: Two times a day (BID) | ORAL | Status: DC
  Administered 2018-05-22 – 2018-05-30 (×16): 1000 mg via ORAL
  Filled 2018-05-22 (×17): qty 2

## 2018-05-22 NOTE — Progress Notes (Signed)
[place] PHYSICAL MEDICINE & REHABILITATION PROGRESS NOTE   Subjective/Complaints: Still working through right hip pain. Developed left chest wall pain after pushing to transfer yesterday. Still sore this morning  ROS: Patient denies fever, rash, sore throat, blurred vision, nausea, vomiting, diarrhea, cough, shortness of breath or chest pain, joint or back pain, headache, or mood change.    Objective:   No results found. No results for input(s): WBC, HGB, HCT, PLT in the last 72 hours. No results for input(s): NA, K, CL, CO2, GLUCOSE, BUN, CREATININE, CALCIUM in the last 72 hours.  Intake/Output Summary (Last 24 hours) at 05/22/2018 0935 Last data filed at 05/21/2018 1700 Gross per 24 hour  Intake -  Output 300 ml  Net -300 ml     Physical Exam: Vital Signs Blood pressure (!) 141/56, pulse (!) 54, temperature 98.5 F (36.9 C), temperature source Oral, resp. rate 16, height 5\' 3"  (1.6 m), weight 101.1 kg, last menstrual period 01/15/1982, SpO2 97 %. Constitutional: No distress . Vital signs reviewed. HEENT: EOMI, oral membranes moist Neck: supple Cardiovascular: RRR without murmur. No JVD    Respiratory: CTA Bilaterally without wheezes or rales. Normal effort    GI: BS +, non-tender, non-distended  Musc: Right hip with edema and tenderness to ROM, left pec tender with palp Neurological: She is alert.  normal insight and awareness.  Motor: Bilateral upper extremities: 5/5 proximal distal Left lower extremity: Hip flexion, knee extension 4-/5, ankle dorsiflexion 4+/5 Right lower extremity: Hip flexion and KE 2-/5 (pain),  ankle dorsiflexion/APF 4/5 --motor exam stable Skin:  Hip incision dressings intact with staples.  Psychiatric: pleasant    Assessment/Plan: 1. Functional deficits secondary to right femoral neck fracture, endstage OA right knee which require 3+ hours per day of interdisciplinary therapy in a comprehensive inpatient rehab setting.  Physiatrist is  providing close team supervision and 24 hour management of active medical problems listed below.  Physiatrist and rehab team continue to assess barriers to discharge/monitor patient progress toward functional and medical goals  Care Tool:  Bathing    Body parts bathed by patient: Right arm, Left upper leg, Left arm, Chest, Abdomen, Face, Front perineal area, Right upper leg, Right lower leg, Left lower leg, Buttocks   Body parts bathed by helper: Buttocks     Bathing assist Assist Level: Supervision/Verbal cueing     Upper Body Dressing/Undressing Upper body dressing   What is the patient wearing?: Bra, Pull over shirt    Upper body assist Assist Level: Set up assist    Lower Body Dressing/Undressing Lower body dressing      What is the patient wearing?: Pants, Incontinence brief     Lower body assist Assist for lower body dressing: Contact Guard/Touching assist     Toileting Toileting    Toileting assist Assist for toileting: Minimal Assistance - Patient > 75%     Transfers Chair/bed transfer  Transfers assist     Chair/bed transfer assist level: Minimal Assistance - Patient > 75%     Locomotion Ambulation   Ambulation assist      Assist level: Minimal Assistance - Patient > 75% Assistive device: Walker-rolling Max distance: 10'   Walk 10 feet activity   Assist  Walk 10 feet activity did not occur: Safety/medical concerns  Assist level: Minimal Assistance - Patient > 75% Assistive device: Walker-rolling   Walk 50 feet activity   Assist Walk 50 feet with 2 turns activity did not occur: Safety/medical concerns  Walk 150 feet activity   Assist Walk 150 feet activity did not occur: Safety/medical concerns         Walk 10 feet on uneven surface  activity   Assist Walk 10 feet on uneven surfaces activity did not occur: Safety/medical concerns         Wheelchair     Assist Will patient use wheelchair at discharge?:  Yes Type of Wheelchair: Manual    Wheelchair assist level: Supervision/Verbal cueing Max wheelchair distance: 150'    Wheelchair 50 feet with 2 turns activity    Assist        Assist Level: Supervision/Verbal cueing   Wheelchair 150 feet activity     Assist     Assist Level: Supervision/Verbal cueing     Medical Problem List and Plan: 1.  Decreased functional mobility secondary to comminuted angulated intertrochanteric right femur fracture. Status post IM nailing 05/10/2018. Weightbearing as tolerated.  Continue CIR PT, OT  -right hip pain limiting 2.  Antithrombotics: -DVT/anticoagulation:  SCDs.   Vascular ultrasound negative for DVT             -antiplatelet therapy: Aspirin 81 mg daily 3. Pain Management: Hydrocodone   as needed. Pt wants to limit use  -continue voltaren gel to right knee  -ice scheduled to right hip  -DCed Robaxin, continueflexeril 5mg  q8 prn spasms  -continue scheduled tylenol 500mg  tid 4. Mood:  Provide emotional support             -antipsychotic agents: N/A  She requires positive reinforcement and encouragement. 5. Neuropsych: This patient is capable of making decisions on her own behalf. 6. Skin/Wound Care:  staple removal ordered  7. Fluids/Electrolytes/Nutrition:  encourage po 8. Acute blood loss anemia.   hgb 10.4 on 4/30  Continue to monitor 9. Hypertension. Norvasc 10 mg daily, Cozaar 100 mg daily--changed cozaar to HS per home regimen  orthostasis seems to be improving  -TEDs, Abd binder if tolerated  Resting bp's actually a little high  -encouraged proper fluid intake and acclimation    10. Hypothyroidism. Synthroid 11.History of ulcerative colitis as well as colon cancer with resection. Continue Azulfidine 1000 mg twice a day 12. Diet-controlled diabetes mellitus. Blood sugar checks discontinued. Continue diabetic diet.  13. Constipation. Laxative assistance    Improving. Moved bowels 5/4---needs to move bowels  today---sorbitol 14.  Morbid obesity: Encouraged appropriate dietary choices  LOS: 8 days A FACE TO [place] 05/22/2018, 9:35 AM

## 2018-05-22 NOTE — Progress Notes (Signed)
Physical Therapy Weekly Progress Note  Patient Details  Name: Jenna Chambers MRN: [id number] Date of Birth: 1932-01-08  Beginning of progress report period: May 15, 2018 End of progress report period: May 22, 2018  Today's Date: 05/22/2018 PT Individual Time: 0800-0900; 5747-3403 PT Individual Time Calculation (min): 60 min and 45 min  Patient has met 3 of 3 short term goals.  Pt is able to complete bed mobility with min to mod A, sit to stand and stand pivot transfers with RW and min A, squat pivot transfers with CGA, ambulation up to 10 ft with RW and min A, and is able to navigate one 3" step with B handrails and max A. Pt remains limited by standing tolerance due to pain in R hip with WBing. Pt is at Supervision level for w/c mobility but still requires max A for management of w/c parts.  Patient continues to demonstrate the following deficits muscle weakness and decreased standing balance, decreased postural control and decreased balance strategies and therefore will continue to benefit from skilled PT intervention to increase functional independence with mobility.  Patient progressing toward long term goals..  Continue plan of care.  PT Short Term Goals Week 1:  PT Short Term Goal 1 (Week 1): Pt will perform least restrictive transfers with min A consistently PT Short Term Goal 1 - Progress (Week 1): Met PT Short Term Goal 2 (Week 1): Pt will initiate stair training PT Short Term Goal 2 - Progress (Week 1): Met PT Short Term Goal 3 (Week 1): Pt will ambulate x 10 ft with LRAD and min A PT Short Term Goal 3 - Progress (Week 1): Met Week 2:  PT Short Term Goal 1 (Week 2): =LTG due to ELOS  Skilled Therapeutic Interventions/Progress Updates:  Session 1: Pt received supine in bed, agreeable to PT session. Pt reports soreness in R hip, not rated and received pain medication from RN at beginning of session. Supine to sit with Supervision with increased time and use of bedrails. Pt is setup  A to get dressed while seated EOB. Stand pivot transfer bed to w/c with RW and min A. Manual w/c propulsion x 150 ft with use of BUE and Supervision. Demonstrated and had pt perform return demo of how to manage w/c legrests, pt requires max A and cueing for this task. Squat pivot transfer w/c to/from mat with CGA. Sidesteps L/R 2 x 10 ft each direction with RW and min A to simulate navigating through narrow doorways in the home. Seated BLE therex x 10 reps: marches, LAQ. Pt demos improved abilit to perform AROM with RLE with less assist needed. Pt left seated in w/c in room with needs in reach at end of session, ice pack to R hip for pain management.  Session 2: Pt received seated EOB, agreeable to PT session. Pt reporting 9/10 pain in R hip, unable to receive any pain medication until end of session. Stand pivot transfer bed to w/c then w/c to mat table with mod A and RW due to pain. Sit to supine CGA with use of leg lifter for RLE management. Supine RLE heel slides with AAROM, attempt quad sets but pt has cramping in RLE, attempt hip abd but pt has sharp pain in R hip. Supine to sit with CGA and use of leg lifter for RLE management, increased time needed 2/2 pain. Seated hip add squeeze x 10 reps. Stand pivot transfer back to w/c with mod A and RW. Pt left seated  in w/c in room with needs in reach, ice pack to R hip at end of session. RN in room to provide pt with pain medication at end of session.   Therapy Documentation Precautions:  Precautions Precautions: Fall Restrictions Weight Bearing Restrictions: Yes RLE Weight Bearing: Weight bearing as tolerated   Therapy/Group: Individual Therapy   Excell Seltzer, PT, DPT  05/22/2018, 9:58 AM

## 2018-05-22 NOTE — Progress Notes (Signed)
10 staples removed from right hip. Pt tolerated well. Site is clean and dry. Ice pack applied for comfort.   Crystie Yanko W Brice Potteiger

## 2018-05-22 NOTE — Plan of Care (Signed)
  Problem: Consults Goal: RH GENERAL PATIENT EDUCATION Description See Patient Education module for education specifics. Outcome: Progressing   Problem: RH BOWEL ELIMINATION Goal: RH STG MANAGE BOWEL WITH ASSISTANCE Description STG Manage Bowel with Mod I Assistance.  Outcome: Progressing   Problem: RH SKIN INTEGRITY Goal: RH STG SKIN FREE OF INFECTION/BREAKDOWN Description No new breakdown with min assist   Outcome: Progressing Goal: RH STG ABLE TO PERFORM INCISION/WOUND CARE W/ASSISTANCE Description STG Able To Perform Incision/Wound Care With [employer].  Outcome: Progressing   Problem: RH SAFETY Goal: RH STG ADHERE TO SAFETY PRECAUTIONS W/ASSISTANCE/DEVICE Description STG Adhere to Safety Precautions With min Assistance/Device.  Outcome: Progressing   Problem: RH PAIN MANAGEMENT Goal: RH STG PAIN MANAGED AT OR BELOW PT'S PAIN GOAL Description <3 out of 10.   Outcome: Progressing

## 2018-05-22 NOTE — Progress Notes (Signed)
Occupational Therapy Weekly Progress Note  Patient Details  Name: Jenna Chambers MRN: [id number] Date of Birth: Aug 30, 1931  Beginning of progress report period: May 15, 2018 End of progress report period: May 22, 2018  Today's Date: 05/22/2018 OT Individual Time: 4034-7425 and 1415-1500  OT Individual Time Calculation (min): 60 min and 45 min   Patient has met 3 of 3 short term goals.  Pt is making steady progress towards OT goals. She is completing stand pivot transfers via RW with overall CGA- min A, occasional mod A required from low surface or when pt more fatigued. Toileting and dressing tasks completed with steadying assist using AE PRN and increased time 2/2 pain with mobility. Pt with very limited tolerance to [place] through R LE.  Cont to address ADLs and increasing independence as well as problem solving accessibility of home from w/c level.   Patient continues to demonstrate the following deficits: muscle weakness, decreased cardiorespiratoy endurance and decreased standing balance, decreased postural control and decreased balance strategies and therefore will continue to benefit from skilled OT intervention to enhance overall performance with BADL and Reduce care partner burden.  Patient progressing toward long term goals..  Continue plan of care.  OT Short Term Goals Week 1:  OT Short Term Goal 1 (Week 1): Pt will don pants with steadying assist using AE PRN OT Short Term Goal 1 - Progress (Week 1): Met OT Short Term Goal 2 (Week 1): Pt will consistently complete stand pivot transfer with min A using LRAD in prep for functional task OT Short Term Goal 2 - Progress (Week 1): Met OT Short Term Goal 3 (Week 1): Pt will stand to complete 1 grooming task with CGA to increase functional standing balance/endurance OT Short Term Goal 3 - Progress (Week 1): Met Week 2:  OT Short Term Goal 1 (Week 2): STG=LTG due to LOS  Skilled Therapeutic Interventions/Progress Updates:    Session One:  Pt seen for OT session focusing on functional transfers, ADL re-training and functional activity tolerance. Pt sitting up in w/c upon arrival, agreeable to tx session. Rated pain 5/10 in R hip, however, reports being pre-medicated prior to tx session and no need for intervention. Voiced need for toileting task. Min A stand pivot transfer using RW w/c> toilet with significantly increased time required. Continent void and completed 3/3 toileting tasks with steadying assist. Returned to w/c in same manner as described above. She completed grooming tasks from w/c level at sink with set-up. Discussed at length home set-up, DME and accessiblity. Practiced stand pivot transfers w/c> flat bed without use of rails in simulation of home environment. Pt provided with and educated regarding use of leg lifter. Trialed pt lying down to her R as home set-up is, however, pt with significant increase in pain with this direction and requiring use of bed rail to assist with sitting up. Trialed pt lying down onto her L and able to completed with supervision/increased time and pt reporting slightly less pain in hip with this method. Guarding assist overall for bed mobility using leg lifter. Pt reports needing to stay in bed for RN procedure at end of session, left in supine with all needs in reach and bed alarm on. Throughout session, education and discussion regarding DME, need for assist, ADL/IADL re-training and performance and d/c planning.   Session Two: Pt seen for OT session cont to focus on functional transfers. Pt sitting up in w/c upon arrival, increased pain and fatigue in R hip. Reports  being pre-medicated and pain going down since PT session, however, unsure of what she was able to do for tx session. Discussed what would happen if she was home and coming up with safe option for transfer method if pt fatigued and in pain. Pt introduced to sliding board transfer, education and demonstration provided for technique. Pt  completed w/c<> EOB vis slide board with guarding assist initially fading to supervision with assist to [place] equipment. Pt voiced liking sliding board and completed transfer with much less pain. Education provided regarding pros/cons to sliding board and need to cont with practice of sit>stand and stand pivot transfers, however, sliding board as a back-up option. Pt in agreement with recommendation.  Also discussed time up in chair and in bed to rest. Pt tolerating extended hours up in w/c and may benefit from resting in supine intermittently throughout the day. Pt voiced understanding, opting to return to bed to rest now and get up again for dinner. She returned to supine with mod A for management of B LEs. Pt left in supine with all needs in reach and ice applied to R hip for comfort.   Therapy Documentation Precautions:  Precautions Precautions: Fall Restrictions Weight Bearing Restrictions: Yes RLE Weight Bearing: Weight bearing as tolerated   Therapy/Group: Individual Therapy  De Jaworski L 05/22/2018, 6:48 AM

## 2018-05-23 ENCOUNTER — Inpatient Hospital Stay (HOSPITAL_COMMUNITY): Payer: Medicare Other | Admitting: Occupational Therapy

## 2018-05-23 ENCOUNTER — Inpatient Hospital Stay (HOSPITAL_COMMUNITY): Payer: Medicare Other | Admitting: Physical Therapy

## 2018-05-23 MED ORDER — TRAMADOL HCL 50 MG PO TABS
25.0000 mg | ORAL_TABLET | Freq: Three times a day (TID) | ORAL | Status: DC | PRN
  Administered 2018-05-23 – 2018-05-29 (×7): 25 mg via ORAL
  Filled 2018-05-23 (×7): qty 1

## 2018-05-23 NOTE — Progress Notes (Signed)
Social Work Patient ID: Jenna Chambers, female   DOB: 1931-06-27, 85 y.o.   MRN: [id number]   CSW met with pt and talked with her niece via telephone to update them on team conference discussion and targeted d/c date of 05-30-18.  Pt is apprehensive/nervous about going home.  Explained to her she would be a week better than she is now and she expressed understanding.  Niece is helping pt prepare for move to home.  She has a Chief Strategy Officer coming to look at pt's home for ramp access.  She is also looking to hire private duty caregivers for pt - agency list given to niece.  CSW would order any DME, however pt has a lot already from previous orthopedic surgery.  CSW will also arrange [facility] for continued therapy.  CSW will continue to follow and assist as needed.

## 2018-05-23 NOTE — Progress Notes (Signed)
Physical Therapy Session Note  Patient Details  Name: Jenna Chambers MRN: [id number] Date of Birth: January 15, 1932  Today's Date: 05/23/2018 PT Individual Time: 1015-1130; 1330-1430 PT Individual Time Calculation (min): 75 min and 60 min  Short Term Goals: Week 2:  PT Short Term Goal 1 (Week 2): =LTG due to ELOS  Skilled Therapeutic Interventions/Progress Updates:  Session 1: Pt received seated in w/c in room, agreeable to PT session. Pt reports pain in R hip, not rated and declines intervention. Manual w/c propulsion x 150 ft with use of BUE and Supervision. Pt transfers sit to stand with CGA throughout session. Ascend/descend one 1" step in // bars laterally with min A, one 4" step in // bars laterally with min A x 2 reps each. Ascend/descend two 3" stairs with one handrail laterally and mod A. Pt exhibits improved tolerance for stairs this date. Squat pivot transfer w/c to/from mat table x 2 reps each direction with CGA, v/c for hand placement and weight shift with transfer. Pt requests to lay down at end of session. Sit to supine min A for RLE management. Pt left semi-reclined in bed with needs in reach, ice pack to R hip.  Session 2: Pt received supine in bed, agreeable to PT session. Pt reports 6/10 pain in R hip, just received pain medication prior to session. Supine to sit Supervision. Stand pivot transfer bed to w/c with RW and CGA. Per pt her BP was low earlier this date, seated BP 163/55. Ambulation 2 x 10 ft with RW and min A, poor tolerance for stance on RLE, v/c to keep R heel on ground in stance. Nustep level 1 x 10 min with use of B UE/LE for global endurance training and RLE ROM. Pt exhibits good tolerance for Nustep. Pt requests to lay down at end of session. Sit to supine min A for RLE management. Pt left semi-reclined in bed with needs in reach, ice pack to R hip.     Therapy Documentation Precautions:  Precautions Precautions: Fall Restrictions Weight Bearing Restrictions:  Yes RLE Weight Bearing: Weight bearing as tolerated    Therapy/Group: Individual Therapy   Excell Seltzer, PT, DPT  05/23/2018, 3:48 PM

## 2018-05-23 NOTE — Progress Notes (Signed)
[place] PHYSICAL MEDICINE & REHABILITATION PROGRESS NOTE   Subjective/Complaints: Right hip still a barrier. Asks if there is something else besides a muscle relaxant she could use between hydrocodone.   ROS: Patient denies fever, rash, sore throat, blurred vision, nausea, vomiting, diarrhea, cough, shortness of breath or chest pain,   headache, or mood change.     Objective:   No results found. No results for input(s): WBC, HGB, HCT, PLT in the last 72 hours. No results for input(s): NA, K, CL, CO2, GLUCOSE, BUN, CREATININE, CALCIUM in the last 72 hours.  Intake/Output Summary (Last 24 hours) at 05/23/2018 0944 Last data filed at 05/23/2018 0752 Gross per 24 hour  Intake 836 ml  Output -  Net 836 ml     Physical Exam: Vital Signs Blood pressure (!) 160/57, pulse 65, temperature 98 F (36.7 C), resp. rate 19, height 5\' 3"  (1.6 m), weight 101.1 kg, last menstrual period 01/15/1982, SpO2 93 %. Constitutional: No distress . Vital signs reviewed. HEENT: EOMI, oral membranes moist Neck: supple Cardiovascular: RRR without murmur. No JVD    Respiratory: CTA Bilaterally without wheezes or rales. Normal effort    GI: BS +, non-tender, non-distended  Musc: Right hip with edema and tenderness to ROM,  left pec tender with palp Neurological: She is alert.  normal insight and awareness.  Motor: Bilateral upper extremities: 5/5 proximal distal Left lower extremity: Hip flexion, knee extension 4-/5, ankle dorsiflexion 4+/5 Right lower extremity: Hip flexion and KE 2-/5 (pain), ankle dorsiflexion/APF 4/5 --stable Skin:  Hip incision dressings intact with staples still in lower incision.  Psychiatric: pleasant    Assessment/Plan: 1. Functional deficits secondary to right femoral neck fracture, endstage OA right knee which require 3+ hours per day of interdisciplinary therapy in a comprehensive inpatient rehab setting.  Physiatrist is providing close team supervision and 24 hour  management of active medical problems listed below.  Physiatrist and rehab team continue to assess barriers to discharge/monitor patient progress toward functional and medical goals  Care Tool:  Bathing    Body parts bathed by patient: Right arm, Left upper leg, Left arm, Chest, Abdomen, Face, Front perineal area, Right upper leg, Right lower leg, Left lower leg, Buttocks   Body parts bathed by helper: Buttocks     Bathing assist Assist Level: Supervision/Verbal cueing     Upper Body Dressing/Undressing Upper body dressing   What is the patient wearing?: Bra, Pull over shirt    Upper body assist Assist Level: Independent    Lower Body Dressing/Undressing Lower body dressing      What is the patient wearing?: Pants, Incontinence brief     Lower body assist Assist for lower body dressing: Contact Guard/Touching assist     Toileting Toileting    Toileting assist Assist for toileting: Minimal Assistance - Patient > 75%     Transfers Chair/bed transfer  Transfers assist     Chair/bed transfer assist level: Contact Guard/Touching assist     Locomotion Ambulation   Ambulation assist      Assist level: Minimal Assistance - Patient > 75% Assistive device: Walker-rolling Max distance: 10'   Walk 10 feet activity   Assist  Walk 10 feet activity did not occur: Safety/medical concerns  Assist level: Minimal Assistance - Patient > 75% Assistive device: Walker-rolling   Walk 50 feet activity   Assist Walk 50 feet with 2 turns activity did not occur: Safety/medical concerns         Walk 150 feet activity  Assist Walk 150 feet activity did not occur: Safety/medical concerns         Walk 10 feet on uneven surface  activity   Assist Walk 10 feet on uneven surfaces activity did not occur: Safety/medical concerns         Wheelchair     Assist Will patient use wheelchair at discharge?: Yes Type of Wheelchair: Manual    Wheelchair  assist level: Supervision/Verbal cueing Max wheelchair distance: 150'    Wheelchair 50 feet with 2 turns activity    Assist        Assist Level: Supervision/Verbal cueing   Wheelchair 150 feet activity     Assist     Assist Level: Supervision/Verbal cueing     Medical Problem List and Plan: 1.  Decreased functional mobility secondary to comminuted angulated intertrochanteric right femur fracture. Status post IM nailing 05/10/2018. Weightbearing as tolerated.  Continue CIR PT, OT  -right hip pain limiting 2.  Antithrombotics: -DVT/anticoagulation:  SCDs.   Vascular ultrasound negative for DVT             -antiplatelet therapy: Aspirin 81 mg daily 3. Pain Management: Hydrocodone   as needed. Pt wants to limit use  -continue voltaren gel to right knee  -ice scheduled to right hip  -will d/c flexeril as she can't tolerate  -try tramadol for moderate pain--see if she can tolerate this better  -continue scheduled tylenol 500mg  tid 4. Mood:  Provide emotional support             -antipsychotic agents: N/A  She requires positive reinforcement and encouragement. 5. Neuropsych: This patient is capable of making decisions on her own behalf. 6. Skin/Wound Care:  staple removal ordered  7. Fluids/Electrolytes/Nutrition:  encourage po 8. Acute blood loss anemia.   hgb 10.4 on 4/30  Continue to monitor 9. Hypertension. Norvasc 10 mg daily, Cozaar 100 mg daily--changed cozaar to HS per home regimen  orthostasis better  -TEDs, Abd binder if tolerated  Resting bp's actually a little high  -push po intake    10. Hypothyroidism. Synthroid 11.History of ulcerative colitis as well as colon cancer with resection. Continue Azulfidine 1000 mg twice a day 12. Diet-controlled diabetes mellitus. Blood sugar checks discontinued. Continue diabetic diet.  13. Constipation. Laxative assistance    Improving. Moved bowels this mornign 14.  Morbid obesity: Encouraged appropriate dietary  choices  LOS: 9 days A FACE TO FACE EVALUATION WAS PERFORMED  Meredith Staggers 05/23/2018, 9:44 AM

## 2018-05-23 NOTE — Progress Notes (Signed)
Occupational Therapy Session Note  Patient Details  Name: Jenna Chambers MRN: [id number] Date of Birth: 1931-04-04  Today's Date: 05/23/2018 OT Individual Time: 2263-3354 OT Individual Time Calculation (min): 60 min    Short Term Goals: Week 2:  OT Short Term Goal 1 (Week 2): STG=LTG due to LOS  Skilled Therapeutic Interventions/Progress Updates:  Pt seen for OT ADL Bathing/dressing session. Pt in supine upon arrival, voiced pain 3/10,however, reports being pre-medicated prior totxsession and agreeable toactivity without    Intervention. She transferred tositting EOB with supervision iusing hospital bed functions. Completed CGA stand pivot transfer to w/c using RW. Stand pivot with use of grab bars and min A to sit on tub bench in shower. She bathed seated on tub bench with supervision using LH sponge to wash LEs.She transferred out of shower and dressed from w/c level. Indep UB dressing and CGA pulling up pants and donning brief. She completed grooming tasks w/c level at sink with set-up. Pt left seated in w/c at end of session, all needs in reach, ice pack applied to R hip for comfort. Discussed w/c width and accessibility. Pt would like wider than 18in w/c, however, would not be able to access bedroom or living room in 20 in wide w/c.   Therapy Documentation Precautions:  Precautions Precautions: Fall Restrictions Weight Bearing Restrictions: Yes RLE Weight Bearing: Weight bearing as tolerated   Therapy/Group: Individual Therapy  Slater Mcmanaman L 05/23/2018, 7:02 AM

## 2018-05-23 NOTE — Progress Notes (Signed)
5 staples removed from lower incision to R hip. No drainage noted. Site clean and dry. No redness noted. Will cont to monitor.  Erie Noe, LPN

## 2018-05-24 ENCOUNTER — Inpatient Hospital Stay (HOSPITAL_COMMUNITY): Payer: Medicare Other | Admitting: Occupational Therapy

## 2018-05-24 NOTE — Plan of Care (Signed)
  Problem: Consults Goal: RH GENERAL PATIENT EDUCATION Description See Patient Education module for education specifics. Outcome: Progressing   Problem: RH BOWEL ELIMINATION Goal: RH STG MANAGE BOWEL WITH ASSISTANCE Description STG Manage Bowel with Mod I Assistance.  Outcome: Progressing   Problem: RH SKIN INTEGRITY Goal: RH STG SKIN FREE OF INFECTION/BREAKDOWN Description No new breakdown with min assist   Outcome: Progressing Goal: RH STG ABLE TO PERFORM INCISION/WOUND CARE W/ASSISTANCE Description STG Able To Perform Incision/Wound Care With [employer].  Outcome: Progressing   Problem: RH SAFETY Goal: RH STG ADHERE TO SAFETY PRECAUTIONS W/ASSISTANCE/DEVICE Description STG Adhere to Safety Precautions With min Assistance/Device.  Outcome: Progressing   Problem: RH PAIN MANAGEMENT Goal: RH STG PAIN MANAGED AT OR BELOW PT'S PAIN GOAL Description <3 out of 10.   Outcome: Progressing

## 2018-05-24 NOTE — Progress Notes (Signed)
Occupational Therapy Session Note  Patient Details  Name: Jenna Chambers MRN: [id number] Date of Birth: 24-Oct-1931  Today's Date: 05/24/2018 OT Individual Time: 1020-1100 OT Individual Time Calculation (min): 40 min    Short Term Goals: Week 2:  OT Short Term Goal 1 (Week 2): STG=LTG due to LOS  Skilled Therapeutic Interventions/Progress Updates:    Treatment session with focus on ADL retraining and functional transfers.  Pt received supine in bed, declining shower but agreeable to therapy session.  Pt reports need to toilet.  Completed stand pivot transfer bed > w/c with RW with CGA and then stand pivot w/c > toilet with use of rail with CGA.  Engaged in bathing at sink with setup assist and pt completing all bathing with supervision except CGA when washing buttocks in standing.  Completed UB dressing without assistance and LB dressing with min assist when standing to pull pants over hips.  Pt remained upright in w/c with all needs in reach.  Therapy Documentation Precautions:  Precautions Precautions: Fall Restrictions Weight Bearing Restrictions: Yes RLE Weight Bearing: Weight bearing as tolerated Pain: Pain Assessment Pain Scale: 0-10 Pain Score: 6  Faces Pain Scale: Hurts even more Pain Type: Surgical pain;Acute pain Pain Location: Hip Pain Orientation: Right Pain Intervention(s): Medication (See eMAR);Other (Comment)(muscle rub )   Therapy/Group: Individual Therapy  Simonne Come 05/24/2018, 12:13 PM

## 2018-05-24 NOTE — Progress Notes (Signed)
[place] PHYSICAL MEDICINE & REHABILITATION PROGRESS NOTE   Subjective/Complaints: Did better with tramadol. Right hip still limiting. Had a good night.   ROS: Patient denies fever, rash, sore throat, blurred vision, nausea, vomiting, diarrhea, cough, shortness of breath or chest pain, joint or back pain, headache, or mood change.      Objective:   No results found. No results for input(s): WBC, HGB, HCT, PLT in the last 72 hours. No results for input(s): NA, K, CL, CO2, GLUCOSE, BUN, CREATININE, CALCIUM in the last 72 hours.  Intake/Output Summary (Last 24 hours) at 05/24/2018 1032 Last data filed at 05/24/2018 0800 Gross per 24 hour  Intake 660 ml  Output -  Net 660 ml     Physical Exam: Vital Signs Blood pressure (!) 156/63, pulse (!) 58, temperature 97.8 F (36.6 C), temperature source Oral, resp. rate 19, height 5\' 3"  (1.6 m), weight 101.1 kg, last menstrual period 01/15/1982, SpO2 100 %. Constitutional: No distress . Vital signs reviewed. HEENT: EOMI, oral membranes moist Neck: supple Cardiovascular: RRR without murmur. No JVD    Respiratory: CTA Bilaterally without wheezes or rales. Normal effort    GI: BS +, non-tender, non-distended  Musc: Right hip with edema and tenderness to ROM,  left pec tender with palp Neurological: She is alert.  normal insight and awareness.  Motor: Bilateral upper extremities: 5/5 proximal distal Left lower extremity: Hip flexion, knee extension 4-/5, ankle dorsiflexion 4+/5 Right lower extremity: Hip flexion and KE 2-/5 (pain), ankle dorsiflexion/APF 4/5 --stable Skin:  Hip incision dressings intact with all staples out. Some surrounding bruising.  Psychiatric: pleasant    Assessment/Plan: 1. Functional deficits secondary to right femoral neck fracture, endstage OA right knee which require 3+ hours per day of interdisciplinary therapy in a comprehensive inpatient rehab setting.  Physiatrist is providing close team supervision and 24  hour management of active medical problems listed below.  Physiatrist and rehab team continue to assess barriers to discharge/monitor patient progress toward functional and medical goals  Care Tool:  Bathing    Body parts bathed by patient: Right arm, Left upper leg, Left arm, Chest, Abdomen, Face, Front perineal area, Right upper leg, Right lower leg, Left lower leg, Buttocks   Body parts bathed by helper: Buttocks     Bathing assist Assist Level: Supervision/Verbal cueing     Upper Body Dressing/Undressing Upper body dressing   What is the patient wearing?: Bra, Pull over shirt    Upper body assist Assist Level: Independent    Lower Body Dressing/Undressing Lower body dressing      What is the patient wearing?: Pants, Incontinence brief     Lower body assist Assist for lower body dressing: Contact Guard/Touching assist     Toileting Toileting    Toileting assist Assist for toileting: Minimal Assistance - Patient > 75%     Transfers Chair/bed transfer  Transfers assist     Chair/bed transfer assist level: Contact Guard/Touching assist     Locomotion Ambulation   Ambulation assist      Assist level: Minimal Assistance - Patient > 75% Assistive device: Walker-rolling Max distance: 10'   Walk 10 feet activity   Assist  Walk 10 feet activity did not occur: Safety/medical concerns  Assist level: Minimal Assistance - Patient > 75% Assistive device: Walker-rolling   Walk 50 feet activity   Assist Walk 50 feet with 2 turns activity did not occur: Safety/medical concerns         Walk 150 feet activity  Assist Walk 150 feet activity did not occur: Safety/medical concerns         Walk 10 feet on uneven surface  activity   Assist Walk 10 feet on uneven surfaces activity did not occur: Safety/medical concerns         Wheelchair     Assist Will patient use wheelchair at discharge?: Yes Type of Wheelchair: Manual     Wheelchair assist level: Supervision/Verbal cueing Max wheelchair distance: 150'    Wheelchair 50 feet with 2 turns activity    Assist        Assist Level: Supervision/Verbal cueing   Wheelchair 150 feet activity     Assist     Assist Level: Supervision/Verbal cueing     Medical Problem List and Plan: 1.  Decreased functional mobility secondary to comminuted angulated intertrochanteric right femur fracture. Status post IM nailing 05/10/2018. Weightbearing as tolerated.  Continue CIR PT, OT  -right hip pain limiting still---if remains a problem by Monday--will re-check xray 2.  Antithrombotics: -DVT/anticoagulation:  SCDs.   Vascular ultrasound negative for DVT             -antiplatelet therapy: Aspirin 81 mg daily 3. Pain Management: Hydrocodone   as needed. Pt wants to limit use  -continue voltaren gel to right knee  -ice scheduled to right hip  -will d/c flexeril as she can't tolerate  - tramadol for moderate pain-seems to be tolerated and helpful  -continue scheduled tylenol 500mg  tid 4. Mood:  Provide emotional support             -antipsychotic agents: N/A  She requires positive reinforcement and encouragement. 5. Neuropsych: This patient is capable of making decisions on her own behalf. 6. Skin/Wound Care:  staple removal ordered  7. Fluids/Electrolytes/Nutrition:  encourage po 8. Acute blood loss anemia.   hgb 10.4 on 4/30  Continue to monitor 9. Hypertension. Norvasc 10 mg daily, Cozaar 100 mg daily--changed cozaar to HS per home regimen  orthostasis better  -TEDs, Abd binder if tolerated  Resting bp's actually a little high  -push po intake    10. Hypothyroidism. Synthroid 11.History of ulcerative colitis as well as colon cancer with resection. Continue Azulfidine 1000 mg twice a day 12. Diet-controlled diabetes mellitus. Blood sugar checks discontinued. Continue diabetic diet.  13. Constipation. Laxative assistance    Improving. Moved bowels  this mornign 14.  Morbid obesity: Encouraged appropriate dietary choices  LOS: 10 days A FACE TO [place] 05/24/2018, 10:32 AM

## 2018-05-25 ENCOUNTER — Inpatient Hospital Stay (HOSPITAL_COMMUNITY): Payer: Medicare Other | Admitting: Occupational Therapy

## 2018-05-25 MED ORDER — TRAZODONE HCL 50 MG PO TABS
25.0000 mg | ORAL_TABLET | Freq: Every day | ORAL | Status: DC
  Administered 2018-05-25 – 2018-05-29 (×5): 25 mg via ORAL
  Filled 2018-05-25 (×5): qty 1

## 2018-05-25 NOTE — Progress Notes (Signed)
[place] PHYSICAL MEDICINE & REHABILITATION PROGRESS NOTE   Subjective/Complaints: Was restless last night. Says she took hydrocodone too early. Right hip still sore  ROS: Patient denies fever, rash, sore throat, blurred vision, nausea, vomiting, diarrhea, cough, shortness of breath or chest pain,   headache, or mood change.   Objective:   No results found. No results for input(s): WBC, HGB, HCT, PLT in the last 72 hours. No results for input(s): NA, K, CL, CO2, GLUCOSE, BUN, CREATININE, CALCIUM in the last 72 hours.  Intake/Output Summary (Last 24 hours) at 05/25/2018 0944 Last data filed at 05/24/2018 2107 Gross per 24 hour  Intake 240 ml  Output -  Net 240 ml     Physical Exam: Vital Signs Blood pressure (!) 152/61, pulse (!) 57, temperature 98 F (36.7 C), temperature source Oral, resp. rate 16, height 5\' 3"  (1.6 m), weight 101.1 kg, last menstrual period 01/15/1982, SpO2 97 %. Constitutional: No distress . Vital signs reviewed. HEENT: EOMI, oral membranes moist Neck: supple Cardiovascular: RRR without murmur. No JVD    Respiratory: CTA Bilaterally without wheezes or rales. Normal effort    GI: BS +, non-tender, non-distended  Musc: right hip tender Neurological: She is alert.  normal insight and awareness.  Motor: Bilateral upper extremities: 5/5 proximal distal Left lower extremity: Hip flexion, knee extension 4-/5, ankle dorsiflexion 4+/5 Right lower extremity: Hip flexion and KE 2-/5 (pain), ankle dorsiflexion/APF 4/5 --stable Skin:  Hip incision dressings intact with all staples out. Some surrounding bruising.--stable  Psychiatric: a little anxious    Assessment/Plan: 1. Functional deficits secondary to right femoral neck fracture, endstage OA right knee which require 3+ hours per day of interdisciplinary therapy in a comprehensive inpatient rehab setting.  Physiatrist is providing close team supervision and 24 hour management of active medical problems listed  below.  Physiatrist and rehab team continue to assess barriers to discharge/monitor patient progress toward functional and medical goals  Care Tool:  Bathing    Body parts bathed by patient: Right arm, Left upper leg, Left arm, Chest, Abdomen, Face, Front perineal area, Right upper leg, Right lower leg, Left lower leg, Buttocks   Body parts bathed by helper: Buttocks     Bathing assist Assist Level: Contact Guard/Touching assist     Upper Body Dressing/Undressing Upper body dressing   What is the patient wearing?: Bra, Pull over shirt    Upper body assist Assist Level: Independent    Lower Body Dressing/Undressing Lower body dressing      What is the patient wearing?: Pants, Incontinence brief     Lower body assist Assist for lower body dressing: Contact Guard/Touching assist     Toileting Toileting    Toileting assist Assist for toileting: Minimal Assistance - Patient > 75%     Transfers Chair/bed transfer  Transfers assist     Chair/bed transfer assist level: Contact Guard/Touching assist     Locomotion Ambulation   Ambulation assist      Assist level: Minimal Assistance - Patient > 75% Assistive device: Walker-rolling Max distance: 10'   Walk 10 feet activity   Assist  Walk 10 feet activity did not occur: Safety/medical concerns  Assist level: Minimal Assistance - Patient > 75% Assistive device: Walker-rolling   Walk 50 feet activity   Assist Walk 50 feet with 2 turns activity did not occur: Safety/medical concerns         Walk 150 feet activity   Assist Walk 150 feet activity did not occur: Safety/medical concerns  Walk 10 feet on uneven surface  activity   Assist Walk 10 feet on uneven surfaces activity did not occur: Safety/medical concerns         Wheelchair     Assist Will patient use wheelchair at discharge?: Yes Type of Wheelchair: Manual    Wheelchair assist level: Supervision/Verbal cueing Max  wheelchair distance: 150'    Wheelchair 50 feet with 2 turns activity    Assist        Assist Level: Supervision/Verbal cueing   Wheelchair 150 feet activity     Assist     Assist Level: Supervision/Verbal cueing     Medical Problem List and Plan: 1.  Decreased functional mobility secondary to comminuted angulated intertrochanteric right femur fracture. Status post IM nailing 05/10/2018. Weightbearing as tolerated.  Continue CIR PT, OT  -consider f/u right hip xr tomorrow 2.  Antithrombotics: -DVT/anticoagulation:  SCDs.   Vascular ultrasound negative for DVT             -antiplatelet therapy: Aspirin 81 mg daily 3. Pain Management: Hydrocodone   as needed. Pt wants to limit use  -continue voltaren gel to right knee  -ice scheduled to right hip  -will d/c flexeril as she can't tolerate  - tramadol for moderate pain-seems to be tolerated and helpful  -continue scheduled tylenol 500mg  tid 4. Mood:  Provide emotional support             -antipsychotic agents: N/A  Add trazodone for sleep 5. Neuropsych: This patient is capable of making decisions on her own behalf. 6. Skin/Wound Care:  staple removal ordered  7. Fluids/Electrolytes/Nutrition:  encourage po 8. Acute blood loss anemia.   hgb 10.4 on 4/30  Continue to monitor 9. Hypertension. Norvasc 10 mg daily, Cozaar 100 mg daily--changed cozaar to HS per home regimen  orthostasis better  -TEDs, Abd binder if tolerated  bp's robust at rest--monitor  -push po intake    10. Hypothyroidism. Synthroid 11.History of ulcerative colitis as well as colon cancer with resection. Continue Azulfidine 1000 mg twice a day 12. Diet-controlled diabetes mellitus. Blood sugar checks discontinued. Continue diabetic diet. controlled  13. Constipation. Laxative assistance    Improving. Moved bowels this mornign 14.  Morbid obesity: Encouraged appropriate dietary choices  LOS: 11 days A FACE TO FACE EVALUATION WAS  PERFORMED  Meredith Staggers 05/25/2018, 9:44 AM

## 2018-05-25 NOTE — Progress Notes (Signed)
Occupational Therapy Session Note  Patient Details  Name: Jenna Chambers MRN: [id number] Date of Birth: [birth year]  Today's Date: 05/25/2018 OT Individual Time: 1000-1045 OT Individual Time Calculation (min): 45 min    Short Term Goals: Week 2:  OT Short Term Goal 1 (Week 2): STG=LTG due to LOS  Skilled Therapeutic Interventions/Progress Updates:    Pt seen for OT session focusing on functional mobility and transfers. Pt sitting up in w/c upon arrival, dressed and ready for tx session. Reports being pre-medicated prior to tx session though complaints of R hip pain throughout session. Rest breaks provided intermittently and ice pack provided at end of session.  She self propelled w/c throughout unit mod I for UE strengthening and endurance.  Therapy mat set to 25" in simulation of pt's bed height at home. Completed stand pivot transfer w/c>EOM with close supervision using RW with increased time. She was able to scoot posteriorly onto mat and return to supine on mat with supervision and increased time. She was able to manage R LE onto mat without need for leg lifter. Returned to sitting EOM with supervision and VCs for technique. Stand pivot back to w/c in same manner as described above. Completed x2 trials of short distance ambulation, ~10 ft with min A using RW. Seated rest breaks required btwn trials. Discussed progression to almost functional ambulation in order to get into bathroom which is not accessible to w/c. Education provided regarding technique for shower stall transfer, however, believe pt is not ready for shower stall transfer at this time, pt in agreement. Pt returned to room at end of session, left seated in w/c with all needs in reach.   Therapy Documentation Precautions:  Precautions Precautions: Fall Restrictions Weight Bearing Restrictions: Yes RLE Weight Bearing: Weight bearing as tolerated   Therapy/Group: Individual Therapy  Eyanna Mcgonagle L 05/25/2018, 6:45 AM

## 2018-05-25 NOTE — Plan of Care (Signed)
  Problem: Consults Goal: RH GENERAL PATIENT EDUCATION Description See Patient Education module for education specifics. Outcome: Progressing   Problem: RH BOWEL ELIMINATION Goal: RH STG MANAGE BOWEL WITH ASSISTANCE Description STG Manage Bowel with Mod I Assistance.  Outcome: Progressing   Problem: RH SKIN INTEGRITY Goal: RH STG SKIN FREE OF INFECTION/BREAKDOWN Description No new breakdown with min assist   Outcome: Progressing Goal: RH STG ABLE TO PERFORM INCISION/WOUND CARE W/ASSISTANCE Description STG Able To Perform Incision/Wound Care With [employer].  Outcome: Progressing   Problem: RH SAFETY Goal: RH STG ADHERE TO SAFETY PRECAUTIONS W/ASSISTANCE/DEVICE Description STG Adhere to Safety Precautions With min Assistance/Device.  Outcome: Progressing   Problem: RH PAIN MANAGEMENT Goal: RH STG PAIN MANAGED AT OR BELOW PT'S PAIN GOAL Description <3 out of 10.   Outcome: Progressing

## 2018-05-26 ENCOUNTER — Inpatient Hospital Stay (HOSPITAL_COMMUNITY): Payer: Medicare Other | Admitting: Occupational Therapy

## 2018-05-26 ENCOUNTER — Inpatient Hospital Stay (HOSPITAL_COMMUNITY): Payer: Medicare Other | Admitting: Physical Therapy

## 2018-05-26 NOTE — Progress Notes (Signed)
Occupational Therapy Session Note  Patient Details  Name: Jenna Chambers MRN: [id number] Date of Birth: 25-May-1931  Today's Date: 05/26/2018 OT Individual Time: 2595-6387 and 1330-1500 OT Individual Time Calculation (min): 60 min and 90 min   Short Term Goals: Week 2:  OT Short Term Goal 1 (Week 2): STG=LTG due to LOS  Skilled Therapeutic Interventions/Progress Updates:    Session One: Pt seen for OT ADL bathing/dressing session. Pt in supine upon arrival, complaints of generalized pain in R hip, however, reports being pre-medicated prior to tx session and agreeable to cont with session with no intervention. She transferred to sitting EOB mod I using hospital bed functions. Throughout session, completed stand pivot transfers with use of RW and close supervision with increased time required for initial stand 2/2 stiffness. Stand pivot transfer to padded tub bench in shower. She bathed from seated position with set-up, using LH sponge to assist with LEs.  She returned to w/c to dress, threaded B LEs into underwear/pants with supervision. Stood at [employer] with steadying assist in order to pull pants up. TED hose donned total A and pt able to slip on slide on shoes. Grooming tasks completed with set-up from w/c level at sink.  Pt left sitting up in w/c at end of session, all needs in reach.  Education provided throughout session regarding energy conservation, modified ADLs, and d/c planning.   Session Two: Pt seen for OT session focusing on functional mobility and IADL re-training. Pt sitting EOB upon arrival, agreeable to tx session. Reports increased R hip pain since PT session, up to date on pain meds and willing to participate as able.  Completed squat pivot transfer EOB> w/c with close supervision, VCs and assist to stabilize equipment.  She self propelled w/c throughout unit using B UEs for general strengthening/conditioning.  In ADL apartment, extensive education/discussion regarding home kitchen  set-up and accessibility. Practiced retrieving items from w/c level as well as education for set-up for increased ease and accessibility.  Completed sit>stand at kitchen counter with min A and VCs for technique. Practiced removing and replacing items from overhead cabinet, maintaining 1 UE support on counter top. Squat pivot transfer w/c> low soft surface couch. Min A with decreased control. Education provided regarding various methods for transfer, pt opting to stand at [facility], stood with mod A to RW and stand pivot back to w/c. Completed stand pivot to recliner as well, mod A to stand from recliner with assist to stabilize equipment.  Pt returned to w/c and taken back to room. CGA stand pivot to bed and mod A to return to supine.  Pt left in supine with all needs in reach and ice pack applied to R hip for comfort. Extensive education/discussion throughout session regarding OT goals, home accessibility, energy conservation, modified ADLs/IADLs and positioning, hired caregiver and role, continuum of care, DME, and d/c planning.   Therapy Documentation Precautions:  Precautions Precautions: Fall Restrictions Weight Bearing Restrictions: No RLE Weight Bearing: Weight bearing as tolerated  Therapy/Group: Individual Therapy  Finch Costanzo L 05/26/2018, 7:03 AM

## 2018-05-26 NOTE — Progress Notes (Signed)
Physical Therapy Session Note  Patient Details  Name: Jenna Chambers MRN: [id number] Date of Birth: 1931-07-30  Today's Date: 05/26/2018 PT Individual Time: 1130-1230 PT Individual Time Calculation (min): 60 min   Short Term Goals: Week 2:  PT Short Term Goal 1 (Week 2): =LTG due to ELOS  Skilled Therapeutic Interventions/Progress Updates:    Pt received seated in w/c in room, agreeable to PT session. Pt reports R hip soreness at rest, increase in pain from R hip down thigh into R knee with activity. Manual w/c propulsion x 150 ft with use of BUE and Supervision. Sit to stand with CGA to RW throughout session. Ascend/descend one 1" curb step with RW and min A x 4 reps to simulate threshold to enter patient's home. Ambulation x 10 ft with RW and min A. Squat pivot transfer L/R x 5 reps with CGA. Pt left seated EOB in room with needs in reach at end of session. Ice pack to R hip at end of session, pain not rated.  Therapy Documentation Precautions:  Precautions Precautions: Fall Restrictions Weight Bearing Restrictions: No RLE Weight Bearing: Weight bearing as tolerated    Therapy/Group: Individual Therapy   Excell Seltzer, PT, DPT  05/26/2018, 1:25 PM

## 2018-05-26 NOTE — Progress Notes (Signed)
[place] PHYSICAL MEDICINE & REHABILITATION PROGRESS NOTE   Subjective/Complaints: No new complaints. Right hip might be a little better today.  Slept better  Objective:   No results found. No results for input(s): WBC, HGB, HCT, PLT in the last 72 hours. No results for input(s): NA, K, CL, CO2, GLUCOSE, BUN, CREATININE, CALCIUM in the last 72 hours.  Intake/Output Summary (Last 24 hours) at 05/26/2018 0957 Last data filed at 05/26/2018 0725 Gross per 24 hour  Intake 360 ml  Output -  Net 360 ml     Physical Exam: Vital Signs Blood pressure (!) 133/57, pulse (!) 56, temperature 97.7 F (36.5 C), temperature source Oral, resp. rate 16, height 5\' 3"  (1.6 m), weight 101.1 kg, last menstrual period 01/15/1982, SpO2 95 %. Constitutional: No distress . Vital signs reviewed. HEENT: EOMI, oral membranes moist Neck: supple Cardiovascular: RRR without murmur. No JVD    Respiratory: CTA Bilaterally without wheezes or rales. Normal effort    GI: BS +, non-tender, non-distended  Musc: right hip tender Neurological: She is alert.  normal insight and awareness.  Motor: Bilateral upper extremities: 5/5 proximal distal Left lower extremity: Hip flexion, knee extension 4-/5, ankle dorsiflexion 4+/5 Right lower extremity: Hip flexion and KE 2-/5 (pain), ankle dorsiflexion/APF 4/5 --stable exam Skin:  Hip incisions cdi.  Psychiatric: pleasant    Assessment/Plan: 1. Functional deficits secondary to right femoral neck fracture, endstage OA right knee which require 3+ hours per day of interdisciplinary therapy in a comprehensive inpatient rehab setting.  Physiatrist is providing close team supervision and 24 hour management of active medical problems listed below.  Physiatrist and rehab team continue to assess barriers to discharge/monitor patient progress toward functional and medical goals  Care Tool:  Bathing    Body parts bathed by patient: Right arm, Left upper leg, Left arm,  Chest, Abdomen, Face, Front perineal area, Right upper leg, Right lower leg, Left lower leg, Buttocks   Body parts bathed by helper: Buttocks     Bathing assist Assist Level: Supervision/Verbal cueing     Upper Body Dressing/Undressing Upper body dressing   What is the patient wearing?: Bra, Pull over shirt    Upper body assist Assist Level: Independent    Lower Body Dressing/Undressing Lower body dressing      What is the patient wearing?: Pants, Incontinence brief     Lower body assist Assist for lower body dressing: Contact Guard/Touching assist     Toileting Toileting    Toileting assist Assist for toileting: Contact Guard/Touching assist     Transfers Chair/bed transfer  Transfers assist     Chair/bed transfer assist level: Contact Guard/Touching assist     Locomotion Ambulation   Ambulation assist      Assist level: Minimal Assistance - Patient > 75% Assistive device: Walker-rolling Max distance: 10'   Walk 10 feet activity   Assist  Walk 10 feet activity did not occur: Safety/medical concerns  Assist level: Minimal Assistance - Patient > 75% Assistive device: Walker-rolling   Walk 50 feet activity   Assist Walk 50 feet with 2 turns activity did not occur: Safety/medical concerns         Walk 150 feet activity   Assist Walk 150 feet activity did not occur: Safety/medical concerns         Walk 10 feet on uneven surface  activity   Assist Walk 10 feet on uneven surfaces activity did not occur: Safety/medical concerns         Wheelchair  Assist Will patient use wheelchair at discharge?: Yes Type of Wheelchair: Manual    Wheelchair assist level: Supervision/Verbal cueing Max wheelchair distance: 150'    Wheelchair 50 feet with 2 turns activity    Assist        Assist Level: Supervision/Verbal cueing   Wheelchair 150 feet activity     Assist     Assist Level: Supervision/Verbal cueing      Medical Problem List and Plan: 1.  Decreased functional mobility secondary to comminuted angulated intertrochanteric right femur fracture. Status post IM nailing 05/10/2018. Weightbearing as tolerated.  Continue CIR PT, OT  -if pt demonstrates significant pain with therapy today, will re-image leg today 2.  Antithrombotics: -DVT/anticoagulation:  SCDs.   Vascular ultrasound negative for DVT             -antiplatelet therapy: Aspirin 81 mg daily 3. Pain Management: Hydrocodone   as needed. Pt wants to limit use  -continue voltaren gel to right knee  -ice scheduled to right hip  -will d/c flexeril as she can't tolerate  - tramadol for moderate pain-seems to be tolerated and helpful  -continue scheduled tylenol 500mg  tid 4. Mood:  Provide emotional support             -antipsychotic agents: N/A  Add trazodone for sleep 5. Neuropsych: This patient is capable of making decisions on her own behalf. 6. Skin/Wound Care:  staple removal ordered  7. Fluids/Electrolytes/Nutrition:  encourage po 8. Acute blood loss anemia.   hgb 10.4 on 4/30  [facility] to [facility] 9. Hypertension. Norvasc 10 mg daily, Cozaar 100 mg daily--changed cozaar to HS per home regimen  orthostasis better  -TEDs, Abd binder if tolerated  bp's robust at rest   -push po fluids    10. Hypothyroidism. Synthroid 11.History of ulcerative colitis as well as colon cancer with resection. Continue Azulfidine 1000 mg twice a day 12. Diet-controlled diabetes mellitus. Blood sugar checks discontinued. Continue diabetic diet. controlled  13. Constipation. Laxative assistance    Improving. Moved bowels this mornign 14.  Morbid obesity: Encouraged appropriate dietary choices  LOS: 12 days A FACE TO FACE EVALUATION WAS PERFORMED  Meredith Staggers 05/26/2018, 9:57 AM

## 2018-05-27 ENCOUNTER — Inpatient Hospital Stay (HOSPITAL_COMMUNITY): Payer: Medicare Other | Admitting: Occupational Therapy

## 2018-05-27 ENCOUNTER — Inpatient Hospital Stay (HOSPITAL_COMMUNITY): Payer: Medicare Other

## 2018-05-27 NOTE — Progress Notes (Signed)
Occupational Therapy Session Note  Patient Details  Name: Jenna Chambers MRN: [id number] Date of Birth: [birth year]  Today's Date: 05/27/2018 OT Individual Time: 4098-1191 OT Individual Time Calculation (min): 48 min    Short Term Goals: Week 2:  OT Short Term Goal 1 (Week 2): STG=LTG due to LOS  Skilled Therapeutic Interventions/Progress Updates:    Patient in bed and ready for therapy.  Bed mobility - supine to SSP with CS.  Sit to stand from elevated bed CG A, from hemi height w/c min a, from commode CGA - reviewed seating options at home and optimal height for independence.  Patient completed grooming and UB bathing/dressing seated at sink with set up.  LB dressing set up with reacher.  CGA needed for balance in stance for hygiene and pants up/down after toileting.  Patient remained seated in w/c at close of session with seat belt alarm set and call bell in reach.    Therapy Documentation Precautions:  Precautions Precautions: Fall Restrictions Weight Bearing Restrictions: Yes RLE Weight Bearing: Weight bearing as tolerated General:   Vital Signs:  Pain: Pain Assessment Pain Scale: 0-10 Pain Score: 3  Pain Type: Surgical pain Pain Location: Hip Pain Orientation: Right Pain Descriptors / Indicators: Aching Pain Frequency: Intermittent Pain Onset: Gradual Patients Stated Pain Goal: 0 Pain Intervention(s): Repositioned   Other Treatments:     Therapy/Group: Individual Therapy  Carlos Levering 05/27/2018, 12:00 PM

## 2018-05-27 NOTE — Progress Notes (Signed)
[place] PHYSICAL MEDICINE & REHABILITATION PROGRESS NOTE   Subjective/Complaints: No new issues. Still working through hip pain. Slept ok last night  ROS: Patient denies fever, rash, sore throat, blurred vision, nausea, vomiting, diarrhea, cough, shortness of breath or chest pain,  headache, or mood change.   Objective:   No results found. No results for input(s): WBC, HGB, HCT, PLT in the last 72 hours. No results for input(s): NA, K, CL, CO2, GLUCOSE, BUN, CREATININE, CALCIUM in the last 72 hours.  Intake/Output Summary (Last 24 hours) at 05/27/2018 0857 Last data filed at 05/26/2018 1230 Gross per 24 hour  Intake 120 ml  Output -  Net 120 ml     Physical Exam: Vital Signs Blood pressure (!) 140/56, pulse (!) 58, temperature (!) 97.5 F (36.4 C), temperature source Oral, resp. rate 17, height 5\' 3"  (1.6 m), weight 101.1 kg, last menstrual period 01/15/1982, SpO2 93 %. Constitutional: No distress . Vital signs reviewed. HEENT: EOMI, oral membranes moist Neck: supple Cardiovascular: RRR without murmur. No JVD    Respiratory: CTA Bilaterally without wheezes or rales. Normal effort    GI: BS +, non-tender, non-distended  Musc: right hip tender Neurological: She is alert.  normal insight and awareness.  Motor: Bilateral upper extremities: 5/5 proximal distal Left lower extremity: Hip flexion, knee extension 4-/5, ankle dorsiflexion 4+/5 Right lower extremity: Hip flexion and KE 2-/5 (pain), ankle dorsiflexion/APF 4/5 --stable exam Skin:  Hip incisions remain cdi.  Psychiatric: pleasant    Assessment/Plan: 1. Functional deficits secondary to right femoral neck fracture, endstage OA right knee which require 3+ hours per day of interdisciplinary therapy in a comprehensive inpatient rehab setting.  Physiatrist is providing close team supervision and 24 hour management of active medical problems listed below.  Physiatrist and rehab team continue to assess barriers to  discharge/monitor patient progress toward functional and medical goals  Care Tool:  Bathing    Body parts bathed by patient: Right arm, Left upper leg, Left arm, Chest, Abdomen, Face, Front perineal area, Right upper leg, Right lower leg, Left lower leg, Buttocks   Body parts bathed by helper: Buttocks     Bathing assist Assist Level: Supervision/Verbal cueing     Upper Body Dressing/Undressing Upper body dressing   What is the patient wearing?: Bra, Pull over shirt    Upper body assist Assist Level: Independent    Lower Body Dressing/Undressing Lower body dressing      What is the patient wearing?: Pants, Incontinence brief     Lower body assist Assist for lower body dressing: Contact Guard/Touching assist     Toileting Toileting    Toileting assist Assist for toileting: Contact Guard/Touching assist     Transfers Chair/bed transfer  Transfers assist     Chair/bed transfer assist level: Contact Guard/Touching assist     Locomotion Ambulation   Ambulation assist      Assist level: Minimal Assistance - Patient > 75% Assistive device: Walker-rolling Max distance: 10'   Walk 10 feet activity   Assist  Walk 10 feet activity did not occur: Safety/medical concerns  Assist level: Minimal Assistance - Patient > 75% Assistive device: Walker-rolling   Walk 50 feet activity   Assist Walk 50 feet with 2 turns activity did not occur: Safety/medical concerns         Walk 150 feet activity   Assist Walk 150 feet activity did not occur: Safety/medical concerns         Walk 10 feet on uneven surface  activity   Assist Walk 10 feet on uneven surfaces activity did not occur: Safety/medical concerns         Wheelchair     Assist Will patient use wheelchair at discharge?: Yes Type of Wheelchair: Manual    Wheelchair assist level: Supervision/Verbal cueing Max wheelchair distance: 150'    Wheelchair 50 feet with 2 turns  activity    Assist        Assist Level: Supervision/Verbal cueing   Wheelchair 150 feet activity     Assist     Assist Level: Supervision/Verbal cueing     Medical Problem List and Plan: 1.  Decreased functional mobility secondary to comminuted angulated intertrochanteric right femur fracture. Status post IM nailing 05/10/2018. Weightbearing as tolerated.  Continue CIR PT, OT  -team conference today  -ongoing hip pain. Will re-check hip xr today 2.  Antithrombotics: -DVT/anticoagulation:  SCDs.   Vascular ultrasound negative for DVT             -antiplatelet therapy: Aspirin 81 mg daily 3. Pain Management: Hydrocodone   as needed. Pt wants to limit use  -continue voltaren gel to right knee  -ice scheduled to right hip  - tramadol for moderate pain-seems to be tolerated and helpful  -continue scheduled tylenol 500mg  tid 4. Mood:  Provide emotional support             -antipsychotic agents: N/A  Add trazodone for sleep 5. Neuropsych: This patient is capable of making decisions on her own behalf. 6. Skin/Wound Care:  staple removal ordered  7. Fluids/Electrolytes/Nutrition:  encourage po 8. Acute blood loss anemia.   hgb 10.4 on 4/30  Continue to [facility] tomorrow 9. Hypertension. Norvasc 10 mg daily, Cozaar 100 mg daily--changed cozaar to HS per home regimen  orthostasis better  -TEDs, Abd binder if tolerated  bp's robust at rest   -push po fluids    10. Hypothyroidism. Synthroid 11.History of ulcerative colitis as well as colon cancer with resection. Continue Azulfidine 1000 mg twice a day 12. Diet-controlled diabetes mellitus. Blood sugar checks discontinued. Continue diabetic diet. controlled  13. Constipation. Laxative assistance    Improving.  14.  Morbid obesity: diet discussed  LOS: 13 days A FACE TO FACE EVALUATION WAS PERFORMED  Meredith Staggers 05/27/2018, 8:57 AM

## 2018-05-27 NOTE — Progress Notes (Signed)
I reviewed xrays remotely of the right hip.. She has expectant changes following TFNA nailing.  No signs of screw cut out, so she is apporpriate for continued WBAT to RLE.   [name], MD Orthopeadic Surgeon [facility] [place]

## 2018-05-27 NOTE — Progress Notes (Signed)
Physical Therapy Session Note  Patient Details  Name: Jenna Chambers MRN: [id number] Date of Birth: [birth year]  Today's Date: 05/27/2018 PT Individual Time: 1300-1400 PT Individual Time Calculation (min): 60 min   Short Term Goals: Week 2:  PT Short Term Goal 1 (Week 2): =LTG due to ELOS  Skilled Therapeutic Interventions/Progress Updates:    Patient in w/c and agreeable to PT.  Noted x-ray report and spoke with PA who reports MD not yet aware so to keep on bedrest until he can comment.  Patient in w/c and propelled to/from therapy gym with S.  Set up for transfer to bed min A for legrest management.  Sit to stand to RW CGA and stand step to bed with RW and CGA.  Patient sit to supine CGA for R LE.  Supine for therex as noted below.  While performing exercises MD visited and reported okay to continue to mobilize WBAT.  Patient supine to sit with S increased time.  Sit to stand from EOB CGA and ambulated with min A RW x 10' to w/c.  Assisted to gym and performed standing balance activities at counter folding towels with close S/CGA without UE support, then reaching for and tossing horseshoes with 1 UE support and CGA.  Patient assisted back to room and left with call bell and needs in reach.   Therapy Documentation Precautions:  Precautions Precautions: Fall Restrictions Weight Bearing Restrictions: Yes RLE Weight Bearing: Weight bearing as tolerated Pain: Pain Assessment Pain Scale: 0-10 Pain Score: 5  Pain Type: Surgical pain Pain Location: Hip Pain Orientation: Right Pain Descriptors / Indicators: Aching;Grimacing Pain Frequency: Intermittent Pain Onset: With Activity Patients Stated Pain Goal: 2 Pain Intervention(s): Cold applied Exercises: General Exercises - Lower Extremity Ankle Circles/Pumps: AROM;Both;20 reps;Supine Gluteal Sets: AROM;10 reps;Both;Supine Short Arc Quad: Strengthening;Both;10 reps;Supine Heel Slides: Both;AAROM;AROM;10 reps;Supine Hip ABduction/ADduction:  Strengthening;Both;10 reps;Supine  Half bridge on L x 10   Therapy/Group: Individual Therapy  Reginia Naas  Magda Kiel, PT 05/27/2018, 5:12 PM

## 2018-05-27 NOTE — Progress Notes (Addendum)
Occupational Therapy Session Note  Patient Details  Name: Jenna Chambers MRN: [id number] Date of Birth: 03/06/1931  Today's Date: 05/27/2018 OT Individual Time: 0935-1030 and 1435-1500 OT Individual Time Calculation (min): 55 min and 25 min   Short Term Goals: Week 2:  OT Short Term Goal 1 (Week 2): STG=LTG due to LOS  Skilled Therapeutic Interventions/Progress Updates:    Session One: Pt seen for OT session focusing on functional transfers and home accessibility. Pt sitting up in w/c upon arrival, cont with complaints of R hip discomfort, however, reports being pre-medicated and agreeable to tx session as able. Throughout session, pt required increased time and rest breaks 2/2 pain.  Discussed home set-up, specifically in regards to placement of BSC and needed mobility equipment. Pro/cons of transfer methods and positioning provided. Throughout session, completed transfers w/c<> drop arm BSC and EOB <> BSC. Transfer methods including stand pivot with RW, squat pivot, and sliding board transfer. Pt required min A today to stand from w/c level. All transfers completed with close supervision and VCs for technique. Pt believes stand pivot with RW is what she is most comfortable with and what she would like to do at home.  Completed bed mobility EOB<> supine, mod A to bring LEs up onto bed, supervision to transfer to sitting EOB. Pt with questions regarding activity progression, specifically when she will be able to walk into her bathroom as it is not accessible from w/c level. Recommending min A at this time for short distance functional ambulation.  Pt left seated in w/c at end of session, all needs in reach.   Session Two: Pt seen for OT session focusing on functional mobility and transfers. Pt sitting up in w/c upon arrival, up to date on pain meds but cont with complaints of R hip pain, agreeable to tx as able.  W/c mobility mod I level with increased time.  Completed functional ambulation on  tile floor in simulation of home environment, completed with min A with increased time. Seated rest break and able to stand from chair without arm support with min A. Stand pivot back to w/c.  Pt returned to room and completed supervision stand pivot to bed. Min A to return to supine. Pt left in supine with all needs in reach, bed alarm on and ice pack applied to R hip for comfort.   Therapy Documentation Precautions:  Precautions Precautions: Fall Restrictions Weight Bearing Restrictions: No RLE Weight Bearing: Weight bearing as tolerated   Therapy/Group: Individual Therapy  Alazia Crocket L 05/27/2018, 7:03 AM

## 2018-05-28 ENCOUNTER — Inpatient Hospital Stay (HOSPITAL_COMMUNITY): Payer: Medicare Other | Admitting: Physical Therapy

## 2018-05-28 ENCOUNTER — Inpatient Hospital Stay (HOSPITAL_COMMUNITY): Payer: Medicare Other | Admitting: Occupational Therapy

## 2018-05-28 LAB — CBC
HCT: 31.8 % — ABNORMAL LOW (ref 36.0–46.0)
Hemoglobin: 9.8 g/dL — ABNORMAL LOW (ref 12.0–15.0)
MCH: 28.2 pg (ref 26.0–34.0)
MCHC: 30.8 g/dL (ref 30.0–36.0)
MCV: 91.4 fL (ref 80.0–100.0)
Platelets: 238 10*3/uL (ref 150–400)
RBC: 3.48 MIL/uL — ABNORMAL LOW (ref 3.87–5.11)
RDW: 14.6 % (ref 11.5–15.5)
WBC: 6.4 10*3/uL (ref 4.0–10.5)
nRBC: 0 % (ref 0.0–0.2)

## 2018-05-28 LAB — BASIC METABOLIC PANEL
Anion gap: 9 (ref 5–15)
BUN: 27 mg/dL — ABNORMAL HIGH (ref 8–23)
CO2: 25 mmol/L (ref 22–32)
Calcium: 9 mg/dL (ref 8.9–10.3)
Chloride: 105 mmol/L (ref 98–111)
Creatinine, Ser: 1.03 mg/dL — ABNORMAL HIGH (ref 0.44–1.00)
GFR calc Af Amer: 57 mL/min — ABNORMAL LOW (ref >60)
GFR calc non Af Amer: 50 mL/min — ABNORMAL LOW (ref >60)
Glucose, Bld: 127 mg/dL — ABNORMAL HIGH (ref 70–99)
Potassium: 4.5 mmol/L (ref 3.5–5.1)
Sodium: 139 mmol/L (ref 135–145)

## 2018-05-28 NOTE — Progress Notes (Signed)
Physical Therapy Session Note  Patient Details  Name: Jenna Chambers MRN: [id number] Date of Birth: 02-17-1931  Today's Date: 05/28/2018 PT Individual Time: 2952-8413 PT Individual Time Calculation (min): 41 min   Short Term Goals: Week 2:  PT Short Term Goal 1 (Week 2): =LTG due to ELOS  Skilled Therapeutic Interventions/Progress Updates:   Pt in w/c and agreeable to therapy, pain 4/10 in R hip at rest, pt pre-medicated. Pt self-propelled w/c to/from therapy gym w/ BUEs to work on endurance training. Focused on independence w/ w/c parts management and sit<>stands/transfers this session. Verbal cues for technique w/ leg rests, otherwise pt managing parts w/o physical assist. Sit<>stands to RW x5 reps w/ min assist fading to CGA w/ repetition and practice. Decreased physical assistance needed to boost when pt utilizing B armrests to boost up. Supervision to transfer x4 reps w/ RW once in standing. Increased RLE pain in WB, up to 7-8/10, resolves w/ rest. Increased work of breathing w/ all activity, needed brief rest break in between all transitional movements today. Returned to room and ended session in supine, all needs in reach.   Therapy Documentation Precautions:  Precautions Precautions: Fall Restrictions Weight Bearing Restrictions: Yes RLE Weight Bearing: Weight bearing as tolerated Vital Signs: Therapy Vitals Temp: 98.5 F (36.9 C) Temp Source: Oral Pulse Rate: 61 Resp: 18 BP: (!) 135/50 Patient Position (if appropriate): Lying Oxygen Therapy SpO2: 96 % O2 Device: Room Air  Therapy/Group: Individual Therapy  Ceriah Kohler Clent Demark 05/28/2018, 9:41 AM

## 2018-05-28 NOTE — Discharge Summary (Signed)
Physician Discharge Summary  Patient ID: [name] MRN: [id number] DOB/AGE: 83-Sep-1933 83 y.o.  Admit date: 05/14/2018 Discharge date: 05/30/2018  Discharge Diagnoses:  Active Problems:   Displaced fracture of right femoral neck (HCC)   Slow transit constipation   Labile blood pressure   Postoperative pain Acute blood loss anemia Hypothyroidism History of ulcerative colitis as well as colon cancer with resection Diet-controlled diabetes mellitus Constipation   Discharged Condition: Stable  Significant Diagnostic Studies: Dg Pelvis Portable  Result Date: 05/10/2018 CLINICAL DATA:  Fall today.  Hip pain. EXAM: PORTABLE PELVIS 1-2 VIEWS COMPARISON:  Pelvic CT 09/28/2017. FINDINGS: The bones appear adequately mineralized. There is an acute intertrochanteric right femur fracture which is mildly comminuted and moderately angulated. The femoral head is located. No evidence pelvic fracture. Mild lower lumbar spondylosis noted. IMPRESSION: Comminuted and angulated intertrochanteric right femur fracture. Electronically Signed   By: Richardean Sale M.D.   On: 05/10/2018 13:56   Dg Chest Portable 1 View  Result Date: 05/10/2018 CLINICAL DATA:  Fall today.  Right hip fracture. EXAM: PORTABLE CHEST 1 VIEW COMPARISON:  Radiographs 09/16/2008. FINDINGS: 1302 hours. Mild right hemidiaphragm elevation with resulting minimal right basilar atelectasis. The lungs are otherwise clear. There is no pleural effusion or pneumothorax. The heart size and mediastinal contours are stable. No acute osseous findings are seen. IMPRESSION: No evidence of acute chest injury or active cardiopulmonary process. Minimal right basilar atelectasis related to right hemidiaphragm elevation. Electronically Signed   By: Richardean Sale M.D.   On: 05/10/2018 13:59   Dg Knee Right Port  Result Date: 05/10/2018 CLINICAL DATA:  Fall EXAM: PORTABLE RIGHT KNEE - 1-2 VIEW COMPARISON:  None. FINDINGS: Partially visualized  intramedullary rod within the mid femur. No acute displaced fracture or malalignment. Moderate patellofemoral degenerative change. Advanced degenerative change medial compartment of the knee. Mild degenerative change of the lateral compartment. Small knee effusion. IMPRESSION: 1. No acute osseous abnormality. 2. Tricompartment arthritis of the knee with small knee effusion Electronically Signed   By: Donavan Foil M.D.   On: 05/10/2018 18:35   Dg C-arm 1-60 Min  Result Date: 05/10/2018 CLINICAL DATA:  Right hip fracture EXAM: DG C-ARM 61-120 MIN; RIGHT FEMUR 2 VIEWS COMPARISON:  05/10/2018 FINDINGS: Five low resolution intraoperative spot views of the right hip. Total fluoroscopy time was 1 minutes 9 seconds. The images demonstrate intramedullary rod and screw fixation of right intertrochanteric fracture. IMPRESSION: Intraoperative fluoroscopic assistance provided during surgical fixation of right hip fracture Electronically Signed   By: Donavan Foil M.D.   On: 05/10/2018 18:32   Dg Hip Unilat W Or W/o Pelvis 1v Right  Result Date: 05/27/2018 CLINICAL DATA:  Fall 2 weeks ago with persistent right hip pain, initial encounter EXAM: DG HIP (WITH OR WITHOUT PELVIS) 1V RIGHT COMPARISON:  05/10/2018 FINDINGS: Postsurgical changes are noted in the proximal right femur. The fixation screw traversing the femoral neck has withdrawn significantly from the medullary rod and there is increased impaction of the right femoral fracture when compared with the prior intraoperative exam. No other fracture is seen. No other focal abnormality is noted. IMPRESSION: Changes consistent with further impaction of the right femoral fracture with some dislodgement of the fixation screw traversing the femoral neck at its junction with the medullary rod. Electronically Signed   By: Inez Catalina M.D.   On: 05/27/2018 12:27   Dg Femur, Min 2 Views Right  Result Date: 05/10/2018 CLINICAL DATA:  Right hip fracture EXAM: DG C-ARM 61-120  MIN; RIGHT FEMUR 2 VIEWS COMPARISON:  05/10/2018 FINDINGS: Five low resolution intraoperative spot views of the right hip. Total fluoroscopy time was 1 minutes 9 seconds. The images demonstrate intramedullary rod and screw fixation of right intertrochanteric fracture. IMPRESSION: Intraoperative fluoroscopic assistance provided during surgical fixation of right hip fracture Electronically Signed   By: Donavan Foil M.D.   On: 05/10/2018 18:32   Dg Femur Port, [place] 2 Views Right  Result Date: 05/10/2018 CLINICAL DATA:  Fall today.  Right hip pain. EXAM: RIGHT FEMUR PORTABLE 2 VIEW COMPARISON:  None. FINDINGS: The bones appear adequately mineralized. There is an acute comminuted intertrochanteric right femur fracture which demonstrates moderate apex lateral angulation. The right femoral head is located. There are mild underlying right hip degenerative changes. There are moderate degenerative changes at the right knee. No evidence of acute injury in the distal femur. IMPRESSION: Comminuted and angulated intertrochanteric right femur fracture. Electronically Signed   By: Richardean Sale M.D.   On: 05/10/2018 13:58   Vas [place] Lower Extremity Venous (dvt)  Result Date: 05/15/2018  Lower Venous Study Indications: Swelling.  Performing Technologist: Maudry Mayhew MHA, RDMS, RVT, RDCS  Examination Guidelines: A complete evaluation includes B-mode imaging, spectral Doppler, color Doppler, and power Doppler as needed of all accessible portions of each vessel. Bilateral testing is considered an integral part of a complete examination. Limited examinations for reoccurring indications may be performed as noted.  +---------+---------------+---------+-----------+----------+-------+ RIGHT    CompressibilityPhasicitySpontaneityPropertiesSummary +---------+---------------+---------+-----------+----------+-------+ CFV      Full           Yes      Yes                           +---------+---------------+---------+-----------+----------+-------+ SFJ      Full                                                 +---------+---------------+---------+-----------+----------+-------+ FV Prox  Full                                                 +---------+---------------+---------+-----------+----------+-------+ FV Mid   Full                                                 +---------+---------------+---------+-----------+----------+-------+ FV DistalFull                                                 +---------+---------------+---------+-----------+----------+-------+ PFV      Full                                                 +---------+---------------+---------+-----------+----------+-------+ POP      Full           Yes      Yes                          +---------+---------------+---------+-----------+----------+-------+  PTV      Full                                                 +---------+---------------+---------+-----------+----------+-------+ PERO     Full                                                 +---------+---------------+---------+-----------+----------+-------+   +----+---------------+---------+-----------+----------+-------+ LEFTCompressibilityPhasicitySpontaneityPropertiesSummary +----+---------------+---------+-----------+----------+-------+ CFV Full           Yes      Yes                          +----+---------------+---------+-----------+----------+-------+     Summary: Right: There is no evidence of deep vein thrombosis in the lower extremity. No cystic structure found in the popliteal fossa. Left: No evidence of common femoral vein obstruction.  *See table(s) above for measurements and observations. Electronically signed by Monica Martinez MD on 05/15/2018 at 5:18:20 PM.    Final     Labs:  Basic Metabolic Panel: Recent Labs  Lab 05/28/18 0518  NA 139  K 4.5  CL 105  CO2 25  GLUCOSE 127*   BUN 27*  CREATININE 1.03*  CALCIUM 9.0    CBC: Recent Labs  Lab 05/28/18 0518  WBC 6.4  HGB 9.8*  HCT 31.8*  MCV 91.4  PLT 238    CBG: No results for input(s): GLUCAP in the last 168 hours.  Family history.  Father with CAD and hypertension.  Mother with osteoarthritis as well as osteoporosis.  Sister with hypertension as well as brother.  Brother with colon cancer  Brief HPI:    Jenna Chambers is an 85 year old right-handed female with history of colon cancer with resection diabetes mellitus hypertension and ulcerative colitis.  She lives alone 1 level home used a straight cane prior to admission.  Independent with ADLs.  Presented 05/10/2018 after a fall she reportedly fell backwards trying to step on a boat.  No loss of consciousness.  Sustained comminuted and angulated intertrochanteric right femur fracture.  Underwent IM nailing 05/10/2018 per Dr. Victorino December.  Weightbearing as tolerated.  Hospital course complicated by pain management acute blood loss anemia and monitored.  Aspirin for DVT prophylaxis.  Therapy evaluations completed and patient was admitted for a comprehensive rehab program  Hospital Course: KAMERAN LALLIER was admitted to rehab 05/14/2018 for inpatient therapies to consist of PT, ST and OT at least three hours five days a week. Past admission physiatrist, therapy team and rehab RN have worked together to provide customized collaborative inpatient rehab.  Pertaining to patient comminuted angulated intertrochanteric right femur fracture she had undergone IM nailing 05/10/2018.  Weightbearing as tolerated.  X-ray did show some impaction reviewed by orthopedic services felt to be stable and would follow-up as outpatient.  Neurovascular sensation intact.  SCDs for DVT prophylaxis vascular study negative.  Pain management with the use of hydrocodone as needed as well as Voltaren gel to the knee tramadol was also used for moderate pain.  Acute blood loss anemia stable 10.4.  Blood  pressure soft but monitored on Norvasc 10 mg daily as well as Cozaar.  She would follow-up with her primary MD.  Patient remained on Synthroid for hypothyroidism.  She did have a history of ulcerative colitis colon cancer with resection she remained on azulfidine.  Blood sugars overall controlled with diet.  Bouts of constipation with laxative assistance provided.  Physical exam.  Blood pressure 140/61 pulse 68 temperature 98.2 respiration 17 oxygen saturation 95% room air Constitutional.  Well-developed HEENT Head.  Normocephalic and atraumatic Eyes.  EOMs normal no discharge without nystagmus Respiratory effort normal no distress without wheeze GI soft nontender without rebound good bowel sounds Musculoskeletal lower extremity edema right greater than left Neurological.  Alert motor strength 5 out of 5 upper extremities proximal and distal left lower extremity hip flexion knee extension 4 out of 5 ankle dorsiflexion 4+ out of 5.  Right lower extremity hip flexion 2 out of 5 ankle dorsiflexion 4 out of 5.  Skin.  Hip incision clean and dry  Rehab course: During patient's stay in rehab weekly team conferences were held to monitor patient's progress, set goals and discuss barriers to discharge. At admission, patient required minimal assist ambulate 3 feet rolling walker, moderate assist sit to stand, moderate assist supine to sit.  Minimal assist upper body bathing max assist lower body bathing max assist lower body dressing and +2 physical assist toilet transfers  She  has had improvement in activity tolerance, balance, postural control as well as ability to compensate for deficits. She has had improvement in functional use RUE/LUE  and RLE/LLE as well as improvement in awareness.  Working with energy conservation techniques.  Self propelled wheelchair to and from therapy gym for endurance training.  Focused on independence with wheelchair part management.  Sit to stand rolling walker minimal assist  supervision to transfer x4 reps with rolling walker once in standing position.  She could ambulate short household distances rolling walker minimal assistance.  Completes transfers wheelchair to drop arm bedside commode all transfers completed with close supervision.  Needs some assist for lower body dressing.  Full family teaching completed plan discharge to home       Disposition: Discharge disposition: 01-Home or Self Care     Weightbearing as tolerated   Diet: Diabetic diet  Special Instructions: No driving smoking or alcohol  Medications at discharge. 1 Tylenol as needed 2 Norvasc 10 mg p.o. daily 3.  Aspirin 81 mg p.o. daily 4.  Alphagan ophthalmic solution 1 drop both eyes twice daily 4.Azopt ophthalmic solution 1 drop both eyes twice daily 5.  Voltaren gel 4 times daily to affected area 6.  Colace 100 mg p.o. twice daily 7.  Folic acid 1 mg p.o. daily 8.  Hydralazine 10 mg p.o. every 8 hours 9.  Hydrocodone 0.5 to 1 tablet every 6 hours as needed pain 10.  Xalatan ophthalmic solution 1 drop both eyes bedtime 11.  Synthroid 150 mcg p.o. nightly 12.  Cozaar 100 mg p.o. nightly 13.  Multivitamin daily 14.  MiraLAX twice daily hold for loose stools 15.Azulfidine 1000 mg p.o. twice daily 16.  Trazodone 25 mg p.o. nightly      Signed: Lavon Paganini [place] 05/30/2018, 5:14 AM

## 2018-05-28 NOTE — Progress Notes (Signed)
Physical Therapy Session Note  Patient Details  Name: Jenna Chambers MRN: [id number] Date of Birth: February 17, 1931  Today's Date: 05/28/2018 PT Individual Time: 1130-1230 PT Individual Time Calculation (min): 60 min   Short Term Goals: Week 2:  PT Short Term Goal 1 (Week 2): =LTG due to ELOS  Skilled Therapeutic Interventions/Progress Updates:    Pt received supine in bed, agreeable to PT session. No complaints of pain at rest. Supine to sit with Supervision with bed flat and use of bedrail. Per pt her niece is working on Geologist, engineering a bedrail for use at home. Stand pivot transfer bed to w/c with RW and Supervision. Car transfer with Supervision, 27.5" seat height to simulate [employer] RAV 4. Pt requires demonstration of transfer and v/c for safety, is able to manage RLE with use of BUE. Ascend/descend one 3" curb step with RW and min A x 4 repetitions to simulate entering her home through the back door. Seated BLE therex x 10 reps with use of yellow theraband. Manual w/c propulsion x 150 ft with use of BUE and Supervision. Pt left seated in w/c in room with needs in reach, ice pack to R hip at end of session.  Therapy Documentation Precautions:  Precautions Precautions: Fall Restrictions Weight Bearing Restrictions: Yes RLE Weight Bearing: Weight bearing as tolerated   Therapy/Group: Individual Therapy   Excell Seltzer, PT, DPT  05/28/2018, 1:15 PM

## 2018-05-28 NOTE — Patient Care Conference (Signed)
Inpatient RehabilitationTeam Conference and Plan of Care Update Date: 05/27/2018   Time: 2:15 PM    Patient Name: Jenna Chambers      Medical Record Number: [id number]  Date of Birth: 09-08-1931 Sex: Female         Room/Bed: 4W12C/4W12C-01 Payor Info: Payor: [place] / Plan: BCBS MEDICARE / Product Type: *No Product type* /    Admitting Diagnosis: Uni. Rt Hip Fx; 4970Y; 14-16days  Admit Date/Time:  05/14/2018  4:35 PM Admission Comments: No comment available   Primary Diagnosis:  <principal problem not specified> Principal Problem: <principal problem not specified>  Patient Active Problem List   Diagnosis Date Noted  . Slow transit constipation   . Labile blood pressure   . Postoperative pain   . Displaced fracture of right femoral neck ([place]) 05/14/2018  . Essential hypertension   . Hypothyroidism   . Acute blood loss anemia 05/13/2018  . Obesity, Class III, BMI 40-49.9 (morbid obesity) ([place]) 05/13/2018  . Hip fracture ([place]) 05/10/2018  . Colon cancer ([place]) 02/27/2013  . Essential hypertension, benign 02/27/2013  . Glaucoma 02/27/2013  . Ulcerative colitis ([place]) 02/27/2013  . Diabetes [facility])     Expected Discharge Date: Expected Discharge Date: 05/30/18  Team Members Present: Physician leading conference: Dr. Alger Simons Social Worker Present: Alfonse Alpers, LCSW Nurse Present: Dorthula Nettles, RN PT Present: Magda Kiel, PT) OT Present: Amy Rounds, Latina Craver, PT) PPS Coordinator present : Gunnar Fusi     Current Status/Progress Goal Weekly Team Focus  Medical   ongoing pain in right hip. some impaction on xray and prominence of hardware. ortho aware. pt trying to work thru the pain. anxiety at times  reduce pain  see above   Bowel/Bladder   continent of b/b; LBM: 05/12  Remain continent   assist with tolieting needs prn   Swallow/Nutrition/ Hydration             ADL's   Supervision-occasional min A overall  Supervision-mod I  overall  Functional transfers and mobility, ADL re-training, d/c planning   Mobility   Supervision bed mobility, CGA transfers, min A gait up to 10' with RW, Supervision w/c mobility  mod I at w/c level  bed mobility, independence with transfers and management of w/c parts, family completing home modifications   Communication             Safety/Cognition/ Behavioral Observations            Pain   R hip surgical pain; scheduled tylenoland prn norco&tramadol  Pain </= 3/10  assess qshift and prn   Skin   surgical incision with steristrips in place; R thigh ecchymosis  Surgical incision healed, no signs of infection.  assess incision qshift and prn    Rehab Goals Patient on target to meet rehab goals: Yes Rehab Goals Revised: none *See Care Plan and progress notes for long and short-term goals.     Barriers to Discharge  Current Status/Progress Possible Resolutions Date Resolved   Physician       pain mgt     see medical progress notes      Nursing                  PT  Decreased caregiver support;Medical stability;Home environment access/layout                 OT  SLP                SW                Discharge Planning/Teaching Needs:  Pt is planning to return to her home with assistance from her niece and paid caregivers.  Family education will be offered to the niece and/or caregivers, as needed.   Team Discussion:  Dr. Naaman Plummer did an xray of pt's hip due to ongoing hip pain and overall it looked ok, hardware was prominent.  Dr. Stann Mainland, ortho, to see pt as well.  Pt is continent of bowel and bladder and taking vicodin in between scheduled tylenol; is doing great with nursing.  Pt is supervision/occasional min A for sit to stand txs and has mod I/supervision goals.  Pt is CGA for txs with PT and supervision with w/c; min A gait 10'.  Pt is ambulating through the pain.  She can do 1 small step and will meet mod I w/c level PT goals.  Revisions to Treatment  Plan:  none    Continued Need for Acute Rehabilitation Level of Care: The patient requires daily medical management by a physician with specialized training in physical medicine and rehabilitation for the following conditions: Daily direction of a multidisciplinary physical rehabilitation program to ensure safe treatment while eliciting the highest outcome that is of practical value to the patient.: Yes Daily medical management of patient stability for increased activity during participation in an intensive rehabilitation regime.: Yes Daily analysis of laboratory values and/or radiology reports with any subsequent need for medication adjustment of medical intervention for : Post surgical problems;Other   I attest that I was present, lead the team conference, and concur with the assessment and plan of the team.Team conference was held via web/ teleconference due to [place] - 19.   Teghan Philbin, [name] 05/28/2018, 10:38 AM

## 2018-05-28 NOTE — Progress Notes (Signed)
[place] PHYSICAL MEDICINE & REHABILITATION PROGRESS NOTE   Subjective/Complaints: No major problems over night. Able to sleep  ROS: Patient denies fever, rash, sore throat, blurred vision, nausea, vomiting, diarrhea, cough, shortness of breath or chest pain, joint or back pain, headache, or mood change.   Objective:   Dg Hip Unilat W Or W/o Pelvis 1v Right  Result Date: 05/27/2018 CLINICAL DATA:  Fall 2 weeks ago with persistent right hip pain, initial encounter EXAM: DG HIP (WITH OR WITHOUT PELVIS) 1V RIGHT COMPARISON:  05/10/2018 FINDINGS: Postsurgical changes are noted in the proximal right femur. The fixation screw traversing the femoral neck has withdrawn significantly from the medullary rod and there is increased impaction of the right femoral fracture when compared with the prior intraoperative exam. No other fracture is seen. No other focal abnormality is noted. IMPRESSION: Changes consistent with further impaction of the right femoral fracture with some dislodgement of the fixation screw traversing the femoral neck at its junction with the medullary rod. Electronically Signed   By: Inez Catalina M.D.   On: 05/27/2018 12:27   Recent Labs    05/28/18 0518  WBC 6.4  HGB 9.8*  HCT 31.8*  PLT 238   Recent Labs    05/28/18 0518  NA 139  K 4.5  CL 105  CO2 25  GLUCOSE 127*  BUN 27*  CREATININE 1.03*  CALCIUM 9.0    Intake/Output Summary (Last 24 hours) at 05/28/2018 0911 Last data filed at 05/28/2018 0732 Gross per 24 hour  Intake 478 ml  Output -  Net 478 ml     Physical Exam: Vital Signs Blood pressure (!) 135/50, pulse 61, temperature 98.5 F (36.9 C), temperature source Oral, resp. rate 18, height 5\' 3"  (1.6 m), weight 101.1 kg, last menstrual period 01/15/1982, SpO2 96 %. Constitutional: No distress . Vital signs reviewed. HEENT: EOMI, oral membranes moist Neck: supple Cardiovascular: RRR without murmur. No JVD    Respiratory: CTA Bilaterally without wheezes  or rales. Normal effort    GI: BS +, non-tender, non-distended  Musc: right hip tender Neurological: She is alert.  normal insight and awareness.  Motor: Bilateral upper extremities: 5/5 proximal distal Left lower extremity: Hip flexion, knee extension 4-/5, ankle dorsiflexion 4+/5 Right lower extremity: Hip flexion and KE 2-/5 (pain), ankle dorsiflexion/APF 4/5 --no motor changes Skin:  Hip incisions remain CDI Psychiatric: pleasant    Assessment/Plan: 1. Functional deficits secondary to right femoral neck fracture, endstage OA right knee which require 3+ hours per day of interdisciplinary therapy in a comprehensive inpatient rehab setting.  Physiatrist is providing close team supervision and 24 hour management of active medical problems listed below.  Physiatrist and rehab team continue to assess barriers to discharge/monitor patient progress toward functional and medical goals  Care Tool:  Bathing    Body parts bathed by patient: Right arm, Left upper leg, Left arm, Chest, Abdomen, Face, Front perineal area, Right upper leg, Right lower leg, Left lower leg, Buttocks   Body parts bathed by helper: Buttocks     Bathing assist Assist Level: Supervision/Verbal cueing     Upper Body Dressing/Undressing Upper body dressing   What is the patient wearing?: Bra, Pull over shirt    Upper body assist Assist Level: Independent    Lower Body Dressing/Undressing Lower body dressing      What is the patient wearing?: Pants, Underwear/pull up     Lower body assist Assist for lower body dressing: Supervision/Verbal cueing  Toileting Toileting    Toileting assist Assist for toileting: Supervision/Verbal cueing     Transfers Chair/bed transfer  Transfers assist     Chair/bed transfer assist level: Supervision/Verbal cueing     Locomotion Ambulation   Ambulation assist      Assist level: Minimal Assistance - Patient > 75% Assistive device:  Walker-rolling Max distance: 10'   Walk 10 feet activity   Assist  Walk 10 feet activity did not occur: Safety/medical concerns  Assist level: Minimal Assistance - Patient > 75% Assistive device: Walker-rolling   Walk 50 feet activity   Assist Walk 50 feet with 2 turns activity did not occur: Safety/medical concerns         Walk 150 feet activity   Assist Walk 150 feet activity did not occur: Safety/medical concerns         Walk 10 feet on uneven surface  activity   Assist Walk 10 feet on uneven surfaces activity did not occur: Safety/medical concerns         Wheelchair     Assist Will patient use wheelchair at discharge?: Yes Type of Wheelchair: Manual    Wheelchair assist level: Supervision/Verbal cueing Max wheelchair distance: 150'    Wheelchair 50 feet with 2 turns activity    Assist        Assist Level: Supervision/Verbal cueing   Wheelchair 150 feet activity     Assist     Assist Level: Supervision/Verbal cueing     Medical Problem List and Plan: 1.  Decreased functional mobility secondary to comminuted angulated intertrochanteric right femur fracture. Status post IM nailing 05/10/2018. Weightbearing as tolerated.  Continue CIR PT, OT  -hip xray with some impaction. Rods in place  -continue with current plan. ELOS 5/15 2.  Antithrombotics: -DVT/anticoagulation:  SCDs.   Vascular ultrasound negative for DVT             -antiplatelet therapy: Aspirin 81 mg daily 3. Pain Management: Hydrocodone   as needed. Pt wants to limit use  -continue voltaren gel to right knee  -ice scheduled to right hip   - tramadol for moderate pain-seems to be tolerated and helpful  -continue scheduled tylenol 500mg  tid 4. Mood:  Provide emotional support             -antipsychotic agents: N/A  Add trazodone for sleep 5. Neuropsych: This patient is capable of making decisions on her own behalf. 6. Skin/Wound Care:  staple removal ordered  7.  Fluids/Electrolytes/Nutrition:  encourage po 8. Acute blood loss anemia.   hgb 10.4 on 4/30  Continue to [facility] tomorrow 9. Hypertension. Norvasc 10 mg daily, Cozaar 100 mg daily--changed cozaar to HS per home regimen  orthostasis seems better    10. Hypothyroidism. Synthroid 11.History of ulcerative colitis as well as colon cancer with resection. Continue Azulfidine 1000 mg twice a day 12. Diet-controlled diabetes mellitus. Blood sugar checks discontinued. Continue diabetic diet. controlled  13. Constipation. Laxative assistance    Improving.  14.  Morbid obesity: diet discussed  LOS: 14 days A FACE TO FACE EVALUATION WAS PERFORMED  Meredith Staggers 05/28/2018, 9:11 AM

## 2018-05-28 NOTE — Progress Notes (Signed)
Occupational Therapy Session Note  Patient Details  Name: Jenna Chambers MRN: [id number] Date of Birth: Jun 06, 1931  Today's Date: 05/28/2018 OT Individual Time: 3335-4562 and 1430-1500 OT Individual Time Calculation (min): 60 min and 30 min   Short Term Goals: Week 2:  OT Short Term Goal 1 (Week 2): STG=LTG due to LOS  Skilled Therapeutic Interventions/Progress Updates:    Session One:Pt seen for OT Session focusing on ADL re-training. Pt awake in supine upon arival, denied pain at rest and agreeable to tx session. She transferred tosittting EOB with supervision using hospital bed functions. Stand pivot with RW and increased time to transfer to w/c.     Stand Pivot toBSC over Toilet using RW with supervision. Toileting task completed atoverall supervision level. Bathing task complete via sponge bathing as she will have to do at d/c as home bathroom is not w/c accessible. She completed bathing/dressing from w/c level with set-up assist, standing at RW to complete pericare/buttock hygiene. Dressing completed from w/c level, independent UB dressing and supervision with assist to stabilize equipment when standing to pull pants up with increased time and trials required to achieve full stand.  Per pt request, showed her x-rays of hip that were taken yesterday. Education provided within OT scope of practice regarding hip joint and surgery. Pt left set-up completing grooming tasks at sink from w/c level, needed items within reach.  Session Two: Pt seen for OT session focusing on functional mobility and transfers. Pt sitting up in w/c upon arrival, agreeable to tx session and denying pain at rest. Pain much less limiting this session compared to previous sessions. She self propelled w/c throughout unit mod I. Completed obstacle course ambulating btwn objects in simulation of home environment. Min A overall walking with RW, ~37ft before requiring seated rest break and then ambulating back to w/c.  Sit<>stands throughout session completed with supervision. Completed toilet transfer and toileting task in room with supervision and set-up of equipment. Mod A to return to supine at end of session. Left in supervision with all needs in reach and bed alarm on.    Therapy Documentation Precautions:  Precautions Precautions: Fall Restrictions Weight Bearing Restrictions: Yes RLE Weight Bearing: Weight bearing as tolerated   Therapy/Group: Individual Therapy  Andra Heslin L 05/28/2018, 6:58 AM

## 2018-05-28 NOTE — Progress Notes (Signed)
Patient ID: Jenna Chambers, female   DOB: 1931-03-07, 85 y.o.   MRN: [id number]      Diagnosis codes:  S72.141D  Height:   5'3"             Weight:     227lbs/103kg       Patient suffers from displaced intertrochonteric fracture of right femur which impairs her ability to perform daily activities like toileting, feeding, grooming in the home.  A cane or walker will not resolve issue with performing activities of daily living.  A wheelchair will allow patient to safely perform daily activities.  Patient is not able to propel herself in the home using a standard weight wheelchair due to general weakness and endurance.  Patient can self propel in the lightweight wheelchair.

## 2018-05-28 NOTE — Progress Notes (Signed)
Social Work Patient ID: Jenna Chambers, female   DOB: Feb 28, 1931, 85 y.o.   MRN: [id number]   CSW met with pt 05-27-18 and spoke with niece, [name], via telephone to update them on team conference discussion and targeted d/c date of 05-30-18 still the plan.  They expressed understanding, but both are nervous about pt coming home.  Pt will have paid caregivers from [employer] with her when niece cannot be with her and HH will be arranged by this CSW for PT and OT.  CSW also ordered DME and scheduled family education for tomorrow with niece.  CSW will continue to follow and assist as needed.

## 2018-05-29 ENCOUNTER — Encounter (HOSPITAL_COMMUNITY): Payer: Medicare Other | Admitting: Occupational Therapy

## 2018-05-29 ENCOUNTER — Inpatient Hospital Stay (HOSPITAL_COMMUNITY): Payer: Medicare Other | Admitting: Occupational Therapy

## 2018-05-29 ENCOUNTER — Ambulatory Visit (HOSPITAL_COMMUNITY): Payer: Medicare Other | Admitting: Physical Therapy

## 2018-05-29 ENCOUNTER — Inpatient Hospital Stay (HOSPITAL_COMMUNITY): Payer: Medicare Other | Admitting: Physical Therapy

## 2018-05-29 MED ORDER — DICLOFENAC SODIUM 1 % TD GEL: 2.0000 g | Freq: Four times a day (QID) | TRANSDERMAL | 1 refills | Status: AC

## 2018-05-29 MED ORDER — AMLODIPINE BESYLATE 10 MG PO TABS: 10.0000 mg | ORAL_TABLET | Freq: Every day | ORAL | 0 refills | Status: AC

## 2018-05-29 MED ORDER — FOLIC ACID 1 MG PO TABS: 1.0000 mg | ORAL_TABLET | Freq: Every day | ORAL | 0 refills | Status: AC

## 2018-05-29 MED ORDER — LOSARTAN POTASSIUM 100 MG PO TABS: 100.0000 mg | ORAL_TABLET | Freq: Every day | ORAL | 0 refills | Status: AC

## 2018-05-29 MED ORDER — HYDRALAZINE HCL 10 MG PO TABS: 10.0000 mg | ORAL_TABLET | Freq: Three times a day (TID) | ORAL | 0 refills | Status: AC

## 2018-05-29 MED ORDER — LEVOTHYROXINE SODIUM 150 MCG PO TABS: 150.0000 ug | ORAL_TABLET | Freq: Every day | ORAL | 0 refills | Status: AC

## 2018-05-29 MED ORDER — HYDROCODONE-ACETAMINOPHEN 5-325 MG PO TABS: 0.5000 | ORAL_TABLET | Freq: Four times a day (QID) | ORAL | 0 refills | Status: AC | PRN

## 2018-05-29 MED ORDER — HYDROCODONE-ACETAMINOPHEN 5-325 MG PO TABS: 0.5000 | ORAL_TABLET | Freq: Four times a day (QID) | ORAL | 0 refills | Status: DC | PRN

## 2018-05-29 MED ORDER — TRAZODONE HCL 50 MG PO TABS: 25.0000 mg | ORAL_TABLET | Freq: Every day | ORAL | 0 refills | Status: AC

## 2018-05-29 MED ORDER — HYDRALAZINE HCL 10 MG PO TABS: 10.0000 mg | ORAL_TABLET | Freq: Three times a day (TID) | ORAL | 0 refills | Status: DC

## 2018-05-29 MED ORDER — ADULT MULTIVITAMIN W/MINERALS CH: 1.0000 | ORAL_TABLET | Freq: Every day | ORAL | Status: AC

## 2018-05-29 NOTE — Progress Notes (Signed)
Occupational Therapy Discharge Summary  Patient Details  Name: Jenna Chambers MRN: [id number] Date of Birth: 1931/01/25   Patient has met 7 of 7 long term goals due to improved activity tolerance, improved balance, postural control, ability to compensate for deficits and improved coordination.  Patient to discharge at overall Supervision- mod I level.  Patient lives independently and plans to have hired caregivers to assist at d/c. Recommend w/c level ADLs and mobility at d/c. She is able to complete stand pivot transfers with use of RW mod I. Recommend sponge bathing at d/c from seated level as pt's bathroom is not accessible via w/c.   Pt voices feeling comfortable with planned d/c home.   Reasons goals not met: Tub/shower goal not addressed as pt unable to access home bathroom from w/c level.   Recommendation:  Patient will benefit from ongoing skilled OT services in home health setting to continue to advance functional skills in the area of BADL, iADL and Reduce care partner burden.  Equipment: drop arm BSC  Reasons for discharge: treatment goals met and discharge from hospital  Patient/family agrees with progress made and goals achieved: Yes  OT Discharge Precautions/Restrictions  Precautions Precautions: Fall Restrictions Weight Bearing Restrictions: Yes RLE Weight Bearing: Weight bearing as tolerated Vision Baseline Vision/History: Wears glasses Wears Glasses: At all times Patient Visual Report: No change from baseline Vision Assessment?: No apparent visual deficits Perception  Perception: Within Functional Limits Praxis Praxis: Intact Cognition Overall Cognitive Status: Within Functional Limits for tasks assessed Arousal/Alertness: Awake/alert Orientation Level: Oriented X4 Focused Attention: Appears intact Memory: Appears intact Awareness: Appears intact Problem Solving: Appears intact Safety/Judgment: Appears intact Sensation Sensation Light Touch: Appears  Intact(hx neuropathy B LEs) Proprioception: Appears Intact Coordination Gross Motor Movements are Fluid and Coordinated: No Fine Motor Movements are Fluid and Coordinated: Yes Coordination and Movement Description: impaired by pain and generalized weakness, however, greatly improved since admission Motor  Motor Motor: Within Functional Limits Motor - Skilled Clinical Observations: generalized weakness/deconditioning Trunk/Postural Assessment  Cervical Assessment Cervical Assessment: Exceptions to WFL(Forward head) Thoracic Assessment Thoracic Assessment: Exceptions to WFL(Kyphotic) Lumbar Assessment Lumbar Assessment: Exceptions to WFL(Posterior pelvic tilt) Postural Control Postural Control: Deficits on evaluation Righting Reactions: delayed  Balance Balance Balance Assessed: Yes Static Sitting Balance Static Sitting - Balance Support: No upper extremity supported;Feet supported Static Sitting - Level of Assistance: 7: Independent Dynamic Sitting Balance Dynamic Sitting - Balance Support: No upper extremity supported;Feet supported;During functional activity Dynamic Sitting - Level of Assistance: 7: Independent Sitting balance - Comments: Sitting on tub bench to complete bathing task Static Standing Balance Static Standing - Balance Support: During functional activity;Right upper extremity supported;Left upper extremity supported Static Standing - Level of Assistance: 6: Modified independent (Device/Increase time) Dynamic Standing Balance Dynamic Standing - Balance Support: During functional activity;Right upper extremity supported;Left upper extremity supported Dynamic Standing - Level of Assistance: 6: Modified independent (Device/Increase time);5: Stand by assistance Dynamic Standing - Comments: Standing at RW to complete LB clothing management/toileting task Extremity/Trunk Assessment RUE Assessment RUE Assessment: Within Functional Limits LUE Assessment LUE Assessment:  Within Functional Limits   Vedder Brittian L 05/29/2018, 7:46 AM

## 2018-05-29 NOTE — Progress Notes (Signed)
Physical Therapy Session Note  Patient Details  Name: Jenna Chambers MRN: [id number] Date of Birth: Nov 25, 1931  Today's Date: 05/29/2018 PT Individual Time: 4098-1191 PT Individual Time Calculation (min): 60 min   Short Term Goals: Week 2:  PT Short Term Goal 1 (Week 2): =LTG due to ELOS  Skilled Therapeutic Interventions/Progress Updates:   Pt received sitting in WC and agreeable to PT. Pt performed WC mobility without cues or assist to rehab gym x 176f, 1261f and 10073f   Gait training x [id number] RW and min assist for safety. Consistent antalgic gait pattern on the R, cues for posture and step length for imrpoved safety.   Throughout treatment, pt performed sit<>stand and stand pivot transfers x 8 without assist or cues from PT with UE support on RW. Antalgic gait pattern but no LOB noted.  Sit>supine onto mat without cues or assist from PT through log roll on the L. Increased time required due to hip pain for all bed mobility. CGA for supine>sit without rail to improve trunk control and allow increased use of LUE to push to sitting.   PT instructed pt in supine HEP with hand out provided: ankle pumps/circles, SAQ, Heel slides, hip abduction, quad sets, glute sets. All completed x12 with supervision assist and min cues for proper ROM, eccentric control, and decreased compensations through trunk. AAROM required for hip abduction on the R.   Patient returned to room and [facility] reach and all needs met.        Therapy Documentation Precautions:  Precautions Precautions: Fall Restrictions Weight Bearing Restrictions: Yes RLE Weight Bearing: Weight bearing as tolerated   Pain:   4/10 R hip. Aching. RN aware.   Therapy/Group: Individual Therapy  AustLorie Phenix4/2020, 10:34 AM

## 2018-05-29 NOTE — Progress Notes (Signed)
Occupational Therapy Session Note  Patient Details  Name: Jenna Chambers MRN: [id number] Date of Birth: 04/21/1931  Today's Date: 05/29/2018 OT Individual Time: 9381-8299 and 1330-1400 OT Individual Time Calculation (min): 60 min and 30 min   Short Term Goals: Week 2:  OT Short Term Goal 1 (Week 2): STG=LTG due to LOS  Skilled Therapeutic Interventions/Progress Updates:    Session One: pt seen for OT ADL bathing/dressing session. Pt asleep upon arrival, easily awoken and agreeable to tx session. Denied pain at rest and reports being pre-medicated prior to tx session.  She transferred to sitting EOB mod I using hospital bed functions. Throughout session, she completed stand pivot transfers with RW mod I. Transfers including EOB>w/c, w/c> BSC over toilet, and tub transfer bench in shower to w/c. Toileting tasks completed mod I standing with support of RW to complete clothing management, lateral leans for hygiene following BM. She ambulated ~62ft with close supervision to tub transfer bench. She bathed from seated position with set-up and distant supervision.  She returned to w/c to dress mod I overall completing sit>stand with RW to complete clothing management.  She completed grooming tasks mod I at sink from w/c level. Pt left seated in w/c at end of session, all needs in reach.     Session Two: Pt seen for OT session focusing on functional mobility and home accessibility. Pt sitting up in w/c upon arrival, agreeable to tx session and denying pain at rest. RN administered scheduled pain meds at start of session. D/c equipment delivered to pt's home. Pt transferred into new w/c mod I stand pivot with RW. Adjusted leg rests to pt comfort and education/demonstration provided regarding w/c parts management and features.  Practiced self propelling new w/c throughout unit mod I. Set-up simulated bedroom doorway of 26" for pt to propel w/c through. She was able to get through, however, will not be able  to propel with arms at side due to narrow doorway. Practiced walking with RW through 26" door way completed with close supervision, so pt will be able to access bedroom from w/c or RW level, however recommending w/c for safety if able. Pt left seated on  EOM in therapy gym at end of session with hand off to PT.    Therapy Documentation Precautions:  Precautions Precautions: Fall Restrictions Weight Bearing Restrictions: Yes RLE Weight Bearing: Weight bearing as tolerated   Therapy/Group: Individual Therapy  Maximum Reiland L 05/29/2018, 6:51 AM

## 2018-05-29 NOTE — Progress Notes (Signed)
[facility] PHYSICAL MEDICINE & REHABILITATION PROGRESS NOTE   Subjective/Complaints: Had a reasonable night. Hip a little better  ROS: Patient denies fever, rash, sore throat, blurred vision, nausea, vomiting, diarrhea, cough, shortness of breath or chest pain,   or mood change.   Objective:   Dg Hip Unilat W Or W/o Pelvis 1v Right  Result Date: 05/27/2018 CLINICAL DATA:  Fall 2 weeks ago with persistent right hip pain, initial encounter EXAM: DG HIP (WITH OR WITHOUT PELVIS) 1V RIGHT COMPARISON:  05/10/2018 FINDINGS: Postsurgical changes are noted in the proximal right femur. The fixation screw traversing the femoral neck has withdrawn significantly from the medullary rod and there is increased impaction of the right femoral fracture when compared with the prior intraoperative exam. No other fracture is seen. No other focal abnormality is noted. IMPRESSION: Changes consistent with further impaction of the right femoral fracture with some dislodgement of the fixation screw traversing the femoral neck at its junction with the medullary rod. Electronically Signed   By: Inez Catalina M.D.   On: 05/27/2018 12:27   Recent Labs    05/28/18 0518  WBC 6.4  HGB 9.8*  HCT 31.8*  PLT 238   Recent Labs    05/28/18 0518  NA 139  K 4.5  CL 105  CO2 25  GLUCOSE 127*  BUN 27*  CREATININE 1.03*  CALCIUM 9.0    Intake/Output Summary (Last 24 hours) at 05/29/2018 0919 Last data filed at 05/29/2018 0837 Gross per 24 hour  Intake 674 ml  Output -  Net 674 ml     Physical Exam: Vital Signs Blood pressure (!) 150/53, pulse 66, temperature 98.7 F (37.1 C), temperature source Oral, resp. rate 16, height 5\' 3"  (1.6 m), weight 101.1 kg, last menstrual period 01/15/1982, SpO2 96 %. Constitutional: No distress . Vital signs reviewed. HEENT: EOMI, oral membranes moist Neck: supple Cardiovascular: RRR without murmur. No JVD    Respiratory: CTA Bilaterally without wheezes or rales. Normal effort     GI: BS +, non-tender, non-distended  Musc: right hip tender, surrounding bruising Neurological: She is alert.  normal insight and awareness.  Motor: Bilateral upper extremities: 5/5 proximal distal Left lower extremity: Hip flexion, knee extension 4-/5, ankle dorsiflexion 4+/5 Right lower extremity: Hip flexion and KE 2-/5 (pain), ankle dorsiflexion/APF 4/5 --no motor changes Skin:  Hip incisions CDI Psychiatric: pleasant    Assessment/Plan: 1. Functional deficits secondary to right femoral neck fracture, endstage OA right knee which require 3+ hours per day of interdisciplinary therapy in a comprehensive inpatient rehab setting.  Physiatrist is providing close team supervision and 24 hour management of active medical problems listed below.  Physiatrist and rehab team continue to assess barriers to discharge/monitor patient progress toward functional and medical goals  Care Tool:  Bathing    Body parts bathed by patient: Right arm, Left upper leg, Left arm, Chest, Abdomen, Face, Front perineal area, Right upper leg, Right lower leg, Left lower leg, Buttocks   Body parts bathed by helper: Buttocks     Bathing assist Assist Level: Set up assist     Upper Body Dressing/Undressing Upper body dressing   What is the patient wearing?: Bra, Pull over shirt    Upper body assist Assist Level: Independent    Lower Body Dressing/Undressing Lower body dressing      What is the patient wearing?: Pants, Underwear/pull up     Lower body assist Assist for lower body dressing: Supervision/Verbal cueing     Toileting  Toileting    Toileting assist Assist for toileting: Independent with assistive device     Transfers Chair/bed transfer  Transfers assist     Chair/bed transfer assist level: Independent with assistive device     Locomotion Ambulation   Ambulation assist      Assist level: Minimal Assistance - Patient > 75% Assistive device: Walker-rolling Max  distance: 10'   Walk 10 feet activity   Assist  Walk 10 feet activity did not occur: Safety/medical concerns  Assist level: Minimal Assistance - Patient > 75% Assistive device: Walker-rolling   Walk 50 feet activity   Assist Walk 50 feet with 2 turns activity did not occur: Safety/medical concerns         Walk 150 feet activity   Assist Walk 150 feet activity did not occur: Safety/medical concerns         Walk 10 feet on uneven surface  activity   Assist Walk 10 feet on uneven surfaces activity did not occur: Safety/medical concerns         Wheelchair     Assist Will patient use wheelchair at discharge?: Yes Type of Wheelchair: Manual    Wheelchair assist level: Supervision/Verbal cueing Max wheelchair distance: 150'    Wheelchair 50 feet with 2 turns activity    Assist        Assist Level: Supervision/Verbal cueing   Wheelchair 150 feet activity     Assist     Assist Level: Supervision/Verbal cueing     Medical Problem List and Plan: 1.  Decreased functional mobility secondary to comminuted angulated intertrochanteric right femur fracture. Status post IM nailing 05/10/2018. Weightbearing as tolerated.  Continue CIR PT, OT  -hip xray with some impaction. Rods in place  -continue with current plan. ELOS 5/15 2.  Antithrombotics: -DVT/anticoagulation:  SCDs.   Vascular ultrasound negative for DVT             -antiplatelet therapy: Aspirin 81 mg daily 3. Pain Management: Hydrocodone   as needed.    -continue voltaren gel to right knee  -ice scheduled to right hip   - tramadol for moderate pain-seems to be tolerated and helpful  -continue scheduled tylenol 500mg  tid 4. Mood:  Provide emotional support             -antipsychotic agents: N/A   trazodone for sleep 5. Neuropsych: This patient is capable of making decisions on her own behalf. 6. Skin/Wound Care:  staple removal ordered  7. Fluids/Electrolytes/Nutrition:  encourage  po  I personally reviewed the patient's labs today.  BUN is up---push po fluids  -recheck BMET tomorrow 8. Acute blood loss anemia.   hgb stable at 9.8 today    9. Hypertension. Norvasc 10 mg daily, Cozaar 100 mg daily--changed cozaar to HS per home regimen  orthostasis seems better despite being a little dry    10. Hypothyroidism. Synthroid 11.History of ulcerative colitis as well as colon cancer with resection. Continue Azulfidine 1000 mg twice a day 12. Diet-controlled diabetes mellitus. Blood sugar checks discontinued. Continue diabetic diet. controlled  13. Constipation. Laxative assistance    Improving.  14.  Morbid obesity: diet discussed  LOS: 15 days A FACE TO FACE EVALUATION WAS PERFORMED  Meredith Staggers 05/29/2018, 9:19 AM

## 2018-05-29 NOTE — Progress Notes (Signed)
Physical Therapy Discharge Summary  Patient Details  Name: Jenna Chambers MRN: [id number] Date of Birth: December 29, 1931  Today's Date: 05/29/2018 PT Individual Time: 1400-1500 PT Individual Time Calculation (min): 60 min    Patient has met 6 of 7 long term goals due to improved activity tolerance, improved balance, increased strength and ability to compensate for deficits.  Patient to discharge at a wheelchair level Modified Independent.   Patient's care partner is independent to provide the necessary physical assistance at discharge.  Reasons goals not met: Pt did not meet her stair goal due to having too much pain in R hip to be able to complete stairs safely and efficiently. Pt's family has installed a ramp so she will not need to be able to perform stairs to enter her home. Pt will have to step up a threshold with her RW to enter her home. Pt has practice this with therapy and is able to safely perform this.  Recommendation:  Patient will benefit from ongoing skilled PT services in home health setting to continue to advance safe functional mobility, address ongoing impairments in strength, endurance, pain management, and minimize fall risk.  Equipment: 18x18 manual w/c, pt already owns RW  Reasons for discharge: treatment goals met and discharge from hospital  Patient/family agrees with progress made and goals achieved: Yes   Skilled Intervention: Pt received seated in w/c in therapy gym from OT session, agreeable to PT. No complaints of pain. Pt performed bed mobility, transfers, gait, car transfer, and w/c mobility as noted below and in CARE Tool. Pt left seated EOB with needs in reach at end of session.  PT Discharge Precautions/Restrictions Precautions Precautions: Fall Restrictions Weight Bearing Restrictions: Yes RLE Weight Bearing: Weight bearing as tolerated Vision/Perception  Perception Perception: Within Functional Limits Praxis Praxis: Intact  Cognition Overall  Cognitive Status: Within Functional Limits for tasks assessed Arousal/Alertness: Awake/alert Orientation Level: Oriented X4 Attention: Focused Focused Attention: Appears intact Memory: Appears intact Awareness: Appears intact Problem Solving: Appears intact Safety/Judgment: Appears intact Sensation Sensation Light Touch: Appears Intact(BLE neuropathy at baseline) Proprioception: Appears Intact Coordination Gross Motor Movements are Fluid and Coordinated: No Fine Motor Movements are Fluid and Coordinated: Yes Coordination and Movement Description: impaired by pain and generalized weakness, however, greatly improved since admission Heel Shin Test: unable to assess 2/2 pain Motor  Motor Motor: Within Functional Limits Motor - Discharge Observations: generalized weakness/deconditioning  Mobility Bed Mobility Bed Mobility: Rolling Right;Rolling Left;Supine to Sit;Sit to Supine Rolling Right: Independent Rolling Left: Independent Supine to Sit: Independent Sit to Supine: Independent Transfers Transfers: Sit to [employer] Transfers Sit to Stand: Independent with assistive device Stand Pivot Transfers: Independent with assistive device Transfer (Assistive device): Rolling walker Locomotion  Gait Ambulation: Yes Gait Assistance: Independent with assistive device Gait Distance (Feet): 10 Feet Assistive device: Rolling walker Gait Gait: Yes Gait Pattern: Impaired Gait Pattern: Step-to pattern;Decreased stance time - right;Antalgic Gait velocity: Decreased Stairs / Additional Locomotion Stairs: Yes Stairs Assistance: Minimal Assistance - Patient > 75% Stair Management Technique: With walker Number of Stairs: 1 Height of Stairs: 3 Wheelchair Mobility Wheelchair Mobility: Yes Wheelchair Assistance: Independent with Camera operator: Both upper extremities Wheelchair Parts Management: Independent Distance: 150  Trunk/Postural Assessment   Cervical Assessment Cervical Assessment: Exceptions to WFL(forward head) Thoracic Assessment Thoracic Assessment: Exceptions to WFL(kyphotic) Lumbar Assessment Lumbar Assessment: Exceptions to WFL(posterior pelvic tilt) Postural Control Postural Control: Deficits on evaluation Righting Reactions: delayed  Balance Balance Balance Assessed: Yes Static Sitting Balance Static Sitting -  Balance Support: No upper extremity supported;Feet supported Static Sitting - Level of Assistance: 7: Independent Dynamic Sitting Balance Dynamic Sitting - Balance Support: No upper extremity supported;Feet supported;During functional activity Dynamic Sitting - Level of Assistance: 7: Independent Static Standing Balance Static Standing - Balance Support: During functional activity;Right upper extremity supported;Left upper extremity supported Static Standing - Level of Assistance: 6: Modified independent (Device/Increase time) Dynamic Standing Balance Dynamic Standing - Balance Support: During functional activity;Right upper extremity supported;Left upper extremity supported Dynamic Standing - Level of Assistance: 6: Modified independent (Device/Increase time);5: Stand by assistance Extremity Assessment   RLE Assessment RLE Assessment: Exceptions to [facility] Passive Range of Motion (PROM) Comments: impaired 2/2 pain General Strength Comments: impaired, see below RLE Strength Right Hip Flexion: 3/5 Right Knee Flexion: 4/5 Right Knee Extension: 4/5 Right Ankle Dorsiflexion: 4/5 LLE Assessment LLE Assessment: Exceptions to [facility] Passive Range of Motion (PROM) Comments: [facility] General Strength Comments: improved since eval LLE Strength Left Hip Flexion: 4/5 Left Knee Flexion: 4/5 Left Knee Extension: 4/5 Left Ankle Dorsiflexion: 4/5     Excell Seltzer, PT, DPT 05/29/2018, 4:20 PM

## 2018-05-29 NOTE — Progress Notes (Signed)
Social Work Discharge Note  The overall goal for the admission was met for:   Discharge location: Yes - home to pt's home  Length of Stay: Yes - 16 days  Discharge activity level: Yes - supervision  Home/community participation: Yes  Services provided included: MD, RD, PT, OT, RN, Pharmacy, Neuropsych and SW  Financial Services: Private Insurance: [place] [employer]  Follow-up services arranged: Home Health: PT/OT, DME: [phone]" lightweight wheelchair with basic cushion and drop arm commode and Patient/Family request agency HH: [facility], DME: [employer]  Comments (or additional information):  Pt is able to direct her own care, but her niece is POA and can assist as needed.  PT did supervision/CGA car tx with niece at niece's car.  Pt will have [employer] with her to provide 24/7 supervision and assist as needed.  Patient/Family verbalized understanding of follow-up arrangements: Yes  Individual responsible for coordination of the follow-up plan: pt and her niece, Benson Norway  [phone]  Confirmed correct DME delivered: Trey Sailors 05/29/2018    Kabrea Seeney, [name]

## 2018-05-29 NOTE — Discharge Instructions (Signed)
Inpatient Rehab Discharge Instructions  [place] Discharge date and time: No discharge date for patient encounter.   Activities/Precautions/ Functional Status: Activity: activity as tolerated Diet: diabetic diet Wound Care: keep wound clean and dry Functional status:  ___ No restrictions     ___ Walk up steps independently ___ 24/7 supervision/assistance   ___ Walk up steps with assistance ___ Intermittent supervision/assistance  ___ Bathe/dress independently ___ Walk with walker     _x__ Bathe/dress with assistance ___ Walk Independently    ___ Shower independently ___ Walk with assistance    ___ Shower with assistance ___ No alcohol     ___ Return to work/school ________  COMMUNITY REFERRALS UPON DISCHARGE:   Home Health:   PT     OT  Agency:  [facility] Phone:  [phone] Medical Equipment/Items Ordered:  Drop arm commode; 18"x18" lightweight wheelchair with basic cushion  Agency/Supplier:  [employer]         Phone:  [phone]  Special Instructions: No driving smoking or alcohol   My questions have been answered and I understand these instructions. I will adhere to these goals and the provided educational materials after my discharge from the hospital.  Patient/Caregiver Signature _______________________________ Date __________  Clinician Signature _______________________________________ Date __________  Please bring this form and your medication list with you to all your follow-up doctor's appointments.

## 2018-05-30 ENCOUNTER — Inpatient Hospital Stay (HOSPITAL_COMMUNITY): Payer: Medicare Other | Admitting: Physical Therapy

## 2018-05-30 LAB — BASIC METABOLIC PANEL
Anion gap: 7 (ref 5–15)
BUN: 24 mg/dL — ABNORMAL HIGH (ref 8–23)
CO2: 23 mmol/L (ref 22–32)
Calcium: 8.9 mg/dL (ref 8.9–10.3)
Chloride: 107 mmol/L (ref 98–111)
Creatinine, Ser: 0.82 mg/dL (ref 0.44–1.00)
GFR calc Af Amer: >60 mL/min (ref >60)
GFR calc non Af Amer: >60 mL/min (ref >60)
Glucose, Bld: 131 mg/dL — ABNORMAL HIGH (ref 70–99)
Potassium: 4 mmol/L (ref 3.5–5.1)
Sodium: 137 mmol/L (ref 135–145)

## 2018-05-30 NOTE — Plan of Care (Signed)
  Problem: RH SKIN INTEGRITY Goal: RH STG SKIN FREE OF INFECTION/BREAKDOWN Description No new breakdown with min assist   Outcome: Completed/Met   Problem: RH SAFETY Goal: RH STG ADHERE TO SAFETY PRECAUTIONS W/ASSISTANCE/DEVICE Description STG Adhere to Safety Precautions With min Assistance/Device.  Outcome: Completed/Met   Problem: RH PAIN MANAGEMENT Goal: RH STG PAIN MANAGED AT OR BELOW PT'S PAIN GOAL Description <3 out of 10.   Outcome: Completed/Met

## 2018-05-30 NOTE — Progress Notes (Signed)
Slept well throughout the night. Medicated x1 with vicodin for c/o right hip/thigh pain with positive effects noted.

## 2018-05-30 NOTE — Progress Notes (Signed)
[facility] PHYSICAL MEDICINE & REHABILITATION PROGRESS NOTE   Subjective/Complaints: No new problems. A little anxious about going home but feels ready  ROS: Patient denies fever, rash, sore throat, blurred vision, nausea, vomiting, diarrhea, cough, shortness of breath or chest pain,  headache, or mood change. .   Objective:   No results found. Recent Labs    05/28/18 0518  WBC 6.4  HGB 9.8*  HCT 31.8*  PLT 238   Recent Labs    05/28/18 0518 05/30/18 0520  NA 139 137  K 4.5 4.0  CL 105 107  CO2 25 23  GLUCOSE 127* 131*  BUN 27* 24*  CREATININE 1.03* 0.82  CALCIUM 9.0 8.9    Intake/Output Summary (Last 24 hours) at 05/30/2018 0925 Last data filed at 05/30/2018 0735 Gross per 24 hour  Intake 772 ml  Output -  Net 772 ml     Physical Exam: Vital Signs Blood pressure (!) 138/43, pulse (!) 58, temperature 97.6 F (36.4 C), resp. rate 18, height 5\' 3"  (1.6 m), weight 101.1 kg, last menstrual period 01/15/1982, SpO2 95 %. Constitutional: No distress . Vital signs reviewed. HEENT: EOMI, oral membranes moist Neck: supple Cardiovascular: RRR without murmur. No JVD    Respiratory: CTA Bilaterally without wheezes or rales. Normal effort    GI: BS +, non-tender, non-distended  Musc: right hip tender, surrounding bruising improving Neurological: She is alert.  normal insight and awareness.  Motor: Bilateral upper extremities: 5/5 proximal distal Left lower extremity: Hip flexion, knee extension 4-/5, ankle dorsiflexion 4+/5 Right lower extremity: Hip flexion and KE 2-/5 (pain), ankle dorsiflexion/APF 4/5 --stable Skin:  Hip incisions CDI Psychiatric: pleasant    Assessment/Plan: 1. Functional deficits secondary to right femoral neck fracture, endstage OA right knee which require 3+ hours per day of interdisciplinary therapy in a comprehensive inpatient rehab setting.  Physiatrist is providing close team supervision and 24 hour management of active medical problems  listed below.  Physiatrist and rehab team continue to assess barriers to discharge/monitor patient progress toward functional and medical goals  Care Tool:  Bathing    Body parts bathed by patient: Right arm, Left upper leg, Left arm, Chest, Abdomen, Face, Front perineal area, Right upper leg, Right lower leg, Left lower leg, Buttocks   Body parts bathed by helper: Buttocks     Bathing assist Assist Level: Set up assist     Upper Body Dressing/Undressing Upper body dressing   What is the patient wearing?: Bra, Pull over shirt    Upper body assist Assist Level: Independent    Lower Body Dressing/Undressing Lower body dressing      What is the patient wearing?: Pants, Underwear/pull up     Lower body assist Assist for lower body dressing: Supervision/Verbal cueing     Toileting Toileting    Toileting assist Assist for toileting: Independent with assistive device     Transfers Chair/bed transfer  Transfers assist     Chair/bed transfer assist level: Independent with assistive device Chair/bed transfer assistive device: Programmer, multimedia   Ambulation assist      Assist level: Minimal Assistance - Patient > 75% Assistive device: Walker-rolling Max distance: 25   Walk 10 feet activity   Assist  Walk 10 feet activity did not occur: Safety/medical concerns  Assist level: Minimal Assistance - Patient > 75% Assistive device: Walker-rolling   Walk 50 feet activity   Assist Walk 50 feet with 2 turns activity did not occur: Safety/medical concerns  Walk 150 feet activity   Assist Walk 150 feet activity did not occur: Safety/medical concerns         Walk 10 feet on uneven surface  activity   Assist Walk 10 feet on uneven surfaces activity did not occur: Safety/medical concerns         Wheelchair     Assist Will patient use wheelchair at discharge?: Yes Type of Wheelchair: Manual    Wheelchair assist level:  Independent Max wheelchair distance: 137ft    Wheelchair 50 feet with 2 turns activity    Assist        Assist Level: Independent   Wheelchair 150 feet activity     Assist     Assist Level: Independent     Medical Problem List and Plan: 1.  Decreased functional mobility secondary to comminuted angulated intertrochanteric right femur fracture. Status post IM nailing 05/10/2018. Weightbearing as tolerated.  Dc home today  -follow up with ortho and primary. No PM&R follow up needed unless requested by pt 2.  Antithrombotics: -DVT/anticoagulation:  SCDs.   Vascular ultrasound negative for DVT             -antiplatelet therapy: Aspirin 81 mg daily 3. Pain Management: Hydrocodone   as needed.    -continue voltaren gel to right knee  -ice scheduled to right hip   - tramadol for moderate pain-seems to be tolerated and helpful  -  tylenol 500mg  tid 4. Mood:  Provide emotional support             -antipsychotic agents: N/A   trazodone for sleep 5. Neuropsych: This patient is capable of making decisions on her own behalf. 6. Skin/Wound Care:  staple removal ordered  7. Fluids/Electrolytes/Nutrition:  encourage po  I personally reviewed the patient's labs today again  BUN down to 24, continue to push fluids 8. Acute blood loss anemia.   hgb stable at 9.8      9. Hypertension. Norvasc 10 mg daily, Cozaar 100 mg daily--changed cozaar to HS per home regimen  orthostasis seems better despite being a little dry    10. Hypothyroidism. Synthroid 11.History of ulcerative colitis as well as colon cancer with resection. Continue Azulfidine 1000 mg twice a day 12. Diet-controlled diabetes mellitus. Blood sugar checks discontinued. Continue diabetic diet. controlled  13. Constipation. Laxative assistance    Improving.  14.  Morbid obesity: diet discussed  LOS: 16 days A FACE TO [place] 05/30/2018, 9:25 AM

## 2018-05-30 NOTE — Progress Notes (Signed)
Physical Therapy Session Note  Patient Details  Name: Jenna Chambers MRN: [id number] Date of Birth: [birth year]  Today's Date: 05/30/2018 PT Individual Time: 1030-1055 PT Individual Time Calculation (min): 25 min   Short Term Goals: Week 2:  PT Short Term Goal 1 (Week 2): =LTG due to ELOS  Skilled Therapeutic Interventions/Progress Updates:    Pt received seated in w/c in room ready for discharge home. Assisted pt downstairs via w/c to her niece's car. Education with patient and [name] about how to setup and perform car transfer safely. Stand pivot transfer w/c to car with RW and setup A. Pt left in care of niece and is safe to d/c home.  Therapy Documentation Precautions:  Precautions Precautions: Fall Restrictions Weight Bearing Restrictions: Yes RLE Weight Bearing: Weight bearing as tolerated    Therapy/Group: Individual Therapy   Excell Seltzer, PT, DPT  05/30/2018, 12:19 PM

## 2018-05-31 DIAGNOSIS — S72141D Displaced intertrochanteric fracture of right femur, subsequent encounter for closed fracture with routine healing: Secondary | ICD-10-CM | POA: Diagnosis not present

## 2018-05-31 DIAGNOSIS — Z7982 Long term (current) use of aspirin: Secondary | ICD-10-CM | POA: Diagnosis not present

## 2018-05-31 DIAGNOSIS — I10 Essential (primary) hypertension: Secondary | ICD-10-CM | POA: Diagnosis not present

## 2018-05-31 DIAGNOSIS — E119 Type 2 diabetes mellitus without complications: Secondary | ICD-10-CM | POA: Diagnosis not present

## 2018-05-31 DIAGNOSIS — W1831XD Fall on same level due to stepping on an object, subsequent encounter: Secondary | ICD-10-CM | POA: Diagnosis not present

## 2018-06-02 ENCOUNTER — Telehealth: Payer: Self-pay

## 2018-06-02 NOTE — Telephone Encounter (Signed)
Herbert Deaner, PT from [facility] called requesting verbal orders for HHPT 1wk1, 2wk3, 1wk3. Orders approved and given per discharge summary.

## 2018-06-03 ENCOUNTER — Telehealth: Payer: Self-pay | Admitting: *Deleted

## 2018-06-03 NOTE — Telephone Encounter (Signed)
Erlene Quan PT from [facility] called to get approval for OT visits. They will fax frequency (POC). Approval given.

## 2018-06-06 DIAGNOSIS — S72001D Fracture of unspecified part of neck of right femur, subsequent encounter for closed fracture with routine healing: Secondary | ICD-10-CM | POA: Diagnosis not present

## 2018-06-06 DIAGNOSIS — E039 Hypothyroidism, unspecified: Secondary | ICD-10-CM | POA: Diagnosis not present

## 2018-06-06 DIAGNOSIS — I10 Essential (primary) hypertension: Secondary | ICD-10-CM | POA: Diagnosis not present

## 2018-06-06 DIAGNOSIS — E1142 Type 2 diabetes mellitus with diabetic polyneuropathy: Secondary | ICD-10-CM | POA: Diagnosis not present

## 2018-06-17 DIAGNOSIS — S72141D Displaced intertrochanteric fracture of right femur, subsequent encounter for closed fracture with routine healing: Secondary | ICD-10-CM

## 2018-06-17 DIAGNOSIS — W1831XD Fall on same level due to stepping on an object, subsequent encounter: Secondary | ICD-10-CM

## 2018-06-17 DIAGNOSIS — E119 Type 2 diabetes mellitus without complications: Secondary | ICD-10-CM

## 2018-06-17 DIAGNOSIS — Z7982 Long term (current) use of aspirin: Secondary | ICD-10-CM

## 2018-06-17 DIAGNOSIS — I110 Hypertensive heart disease with heart failure: Secondary | ICD-10-CM

## 2018-06-25 ENCOUNTER — Telehealth: Payer: Self-pay

## 2018-06-25 NOTE — Telephone Encounter (Signed)
Erlene Quan, OT/[facility] requesting extension for HHOT 1wk4. Orders approved and given.

## 2018-06-26 DIAGNOSIS — M1611 Unilateral primary osteoarthritis, right hip: Secondary | ICD-10-CM | POA: Diagnosis not present

## 2018-06-26 DIAGNOSIS — Z4789 Encounter for other orthopedic aftercare: Secondary | ICD-10-CM | POA: Diagnosis not present

## 2018-06-26 DIAGNOSIS — M1711 Unilateral primary osteoarthritis, right knee: Secondary | ICD-10-CM | POA: Diagnosis not present

## 2018-06-30 DIAGNOSIS — Z7982 Long term (current) use of aspirin: Secondary | ICD-10-CM | POA: Diagnosis not present

## 2018-06-30 DIAGNOSIS — S72141D Displaced intertrochanteric fracture of right femur, subsequent encounter for closed fracture with routine healing: Secondary | ICD-10-CM | POA: Diagnosis not present

## 2018-06-30 DIAGNOSIS — I10 Essential (primary) hypertension: Secondary | ICD-10-CM | POA: Diagnosis not present

## 2018-06-30 DIAGNOSIS — W1831XD Fall on same level due to stepping on an object, subsequent encounter: Secondary | ICD-10-CM | POA: Diagnosis not present

## 2018-06-30 DIAGNOSIS — E119 Type 2 diabetes mellitus without complications: Secondary | ICD-10-CM | POA: Diagnosis not present

## 2018-07-24 DIAGNOSIS — Z4789 Encounter for other orthopedic aftercare: Secondary | ICD-10-CM | POA: Diagnosis not present

## 2018-07-29 DIAGNOSIS — S72001A Fracture of unspecified part of neck of right femur, initial encounter for closed fracture: Secondary | ICD-10-CM | POA: Diagnosis not present

## 2018-07-30 DIAGNOSIS — S72141D Displaced intertrochanteric fracture of right femur, subsequent encounter for closed fracture with routine healing: Secondary | ICD-10-CM | POA: Diagnosis not present

## 2018-07-30 DIAGNOSIS — E119 Type 2 diabetes mellitus without complications: Secondary | ICD-10-CM | POA: Diagnosis not present

## 2018-07-30 DIAGNOSIS — I10 Essential (primary) hypertension: Secondary | ICD-10-CM | POA: Diagnosis not present

## 2018-07-30 DIAGNOSIS — W1831XD Fall on same level due to stepping on an object, subsequent encounter: Secondary | ICD-10-CM | POA: Diagnosis not present

## 2018-07-30 DIAGNOSIS — Z7982 Long term (current) use of aspirin: Secondary | ICD-10-CM | POA: Diagnosis not present

## 2018-08-19 DIAGNOSIS — M810 Age-related osteoporosis without current pathological fracture: Secondary | ICD-10-CM | POA: Diagnosis not present

## 2018-08-19 DIAGNOSIS — R634 Abnormal weight loss: Secondary | ICD-10-CM | POA: Diagnosis not present

## 2018-08-19 DIAGNOSIS — Z8269 Family history of other diseases of the musculoskeletal system and connective tissue: Secondary | ICD-10-CM | POA: Diagnosis not present

## 2018-08-19 DIAGNOSIS — S7291XD Unspecified fracture of right femur, subsequent encounter for closed fracture with routine healing: Secondary | ICD-10-CM | POA: Diagnosis not present

## 2018-08-28 DIAGNOSIS — Z0000 Encounter for general adult medical examination without abnormal findings: Secondary | ICD-10-CM | POA: Diagnosis not present

## 2018-08-28 DIAGNOSIS — I10 Essential (primary) hypertension: Secondary | ICD-10-CM | POA: Diagnosis not present

## 2018-08-28 DIAGNOSIS — M810 Age-related osteoporosis without current pathological fracture: Secondary | ICD-10-CM | POA: Diagnosis not present

## 2018-08-28 DIAGNOSIS — E1142 Type 2 diabetes mellitus with diabetic polyneuropathy: Secondary | ICD-10-CM | POA: Diagnosis not present

## 2018-08-29 DIAGNOSIS — S72001A Fracture of unspecified part of neck of right femur, initial encounter for closed fracture: Secondary | ICD-10-CM | POA: Diagnosis not present

## 2018-09-01 DIAGNOSIS — W1831XD Fall on same level due to stepping on an object, subsequent encounter: Secondary | ICD-10-CM | POA: Diagnosis not present

## 2018-09-01 DIAGNOSIS — Z7982 Long term (current) use of aspirin: Secondary | ICD-10-CM | POA: Diagnosis not present

## 2018-09-01 DIAGNOSIS — I10 Essential (primary) hypertension: Secondary | ICD-10-CM | POA: Diagnosis not present

## 2018-09-01 DIAGNOSIS — S72141D Displaced intertrochanteric fracture of right femur, subsequent encounter for closed fracture with routine healing: Secondary | ICD-10-CM | POA: Diagnosis not present

## 2018-09-01 DIAGNOSIS — E119 Type 2 diabetes mellitus without complications: Secondary | ICD-10-CM | POA: Diagnosis not present

## 2018-09-03 DIAGNOSIS — Z1211 Encounter for screening for malignant neoplasm of colon: Secondary | ICD-10-CM | POA: Diagnosis not present

## 2018-09-04 DIAGNOSIS — S72001D Fracture of unspecified part of neck of right femur, subsequent encounter for closed fracture with routine healing: Secondary | ICD-10-CM | POA: Diagnosis not present

## 2018-09-29 DIAGNOSIS — S72001A Fracture of unspecified part of neck of right femur, initial encounter for closed fracture: Secondary | ICD-10-CM | POA: Diagnosis not present

## 2018-10-08 ENCOUNTER — Encounter: Payer: Self-pay | Admitting: Physical Therapy

## 2018-10-08 ENCOUNTER — Ambulatory Visit: Payer: Medicare Other | Attending: Orthopedic Surgery | Admitting: Physical Therapy

## 2018-10-08 ENCOUNTER — Other Ambulatory Visit: Payer: Self-pay

## 2018-10-08 DIAGNOSIS — M25551 Pain in right hip: Secondary | ICD-10-CM | POA: Diagnosis not present

## 2018-10-08 DIAGNOSIS — M6281 Muscle weakness (generalized): Secondary | ICD-10-CM | POA: Insufficient documentation

## 2018-10-08 DIAGNOSIS — R52 Pain, unspecified: Secondary | ICD-10-CM | POA: Insufficient documentation

## 2018-10-08 DIAGNOSIS — R262 Difficulty in walking, not elsewhere classified: Secondary | ICD-10-CM | POA: Diagnosis not present

## 2018-10-08 NOTE — Therapy (Signed)
[place], [place], [address] Phone: [phone]   Fax:  [phone]  Physical Therapy Evaluation  Patient Details  Name: Jenna Chambers MRN: [id number] Date of Birth: 02/25/1931 Referring Provider (PT): Victorino December   Encounter Date: 10/08/2018  PT End of Session - 10/08/18 1705    Visit Number  1    Date for PT Re-Evaluation  12/08/18    PT Start Time  1642    PT Stop Time  1730    PT Time Calculation (min)  48 min    Activity Tolerance  Patient tolerated treatment well    Behavior During Therapy  [facility] for tasks assessed/performed       Past Medical History:  Diagnosis Date  . Colon cancer ([place])    resecetion  . Diabetes ([place])    Type 2  . Glaucoma    --both eyes per patient getting worse  . Gout   . Hypertension   . Osteopenia   . Sleep apnea    no CPAP  . Ulcerative colitis [facility])     Past Surgical History:  Procedure Laterality Date  . BILATERAL SALPINGOOPHORECTOMY  1993   serous cystadenoma  . CATARACT EXTRACTION  2006, 2008   gluacoma  . EXPLORATORY LAPAROTOMY  1996   incarcerated hernia  . INTRAMEDULLARY (IM) NAIL INTERTROCHANTERIC Right 05/10/2018   Procedure: INTRAMEDULLARY (IM) NAIL INTERTROCHANTRIC;  Surgeon: [name], MD;  Location: [place];  Service: Orthopedics;  Laterality: Right;  . PARTIAL COLECTOMY  1991  . TOTAL KNEE ARTHROPLASTY Left 2010    There were no vitals filed for this visit.   Subjective Assessment - 10/08/18 1645    Subjective  Patient reports a fall in April and sustained a right hip fracture, she underwent an IM nailing of the irght hip in late April.  She was in rehab at the hospital for for about 2 weeks, then to home, she then had therapy in the home and that ended in late August.  She reports that she is not happy ith her current level    How long can you walk comfortably?  150 feet and out of breath    Patient Stated Goals  walk  with a cane or nothing, get stronger and be able to do more    Currently in Pain?  Yes    Pain Score  1     Pain Location  Hip   some pain in the right kee   Pain Orientation  Right    Pain Descriptors / Indicators  Aching;Sore    Pain Type  Acute pain    Pain Radiating Towards  does report neuropathy of the feet    Pain Onset  More than a month ago    Pain Frequency  Intermittent    Aggravating Factors   walking, standing pain can be 3-4/10    Pain Relieving Factors  rest the pain can be 0/10    Effect of Pain on Daily Activities  limits walking, functioning, some limitations in sleeping, limits shopping and driving         [facility] PT Assessment - 10/08/18 0001      Assessment   Medical Diagnosis  s/p right hip ORIF    Referring Provider (PT)  Victorino December    Onset Date/Surgical Date  05/10/18    Prior Therapy  home PT      Precautions   Precautions  None  Balance Screen   Has the patient fallen in the past 6 months  Yes    How many times?  1    Has the patient had a decrease in activity level because of a fear of falling?   Yes    Is the patient reluctant to leave their home because of a fear of falling?   No      Home Environment   Additional Comments  lives alone, does light housework      Prior Function   Level of Independence  Independent   a cane outside the home, nothing inside the home   Vocation  Retired    [employer]  no exercise      ROM / Strength   AROM / PROM / Strength  AROM;Strength      AROM   Overall AROM Comments  standing hip flexion 60 degrees, right knee AROM is WNL's      Strength   Overall Strength Comments  right hip 4-/5 mild discomfort, knee and ankle 4/5      Palpation   Palpation comment  non tender      Ambulation/Gait   Gait Comments  uses a FWW, slow, I tried having her use a SPC, she really does not trust the right leg and really talks about the knee feeling like it will give away      Standardized Balance  Assessment   Standardized Balance Assessment  Timed Up and Go Test      Timed Up and Go Test   Normal TUG (seconds)  30    TUG Comments  uses FWW                Objective measurements completed on examination: See above findings.      [facility] Adult PT Treatment/Exercise - 10/08/18 0001      Exercises   Exercises  Knee/Hip      Knee/Hip Exercises: Aerobic   Nustep  level 4 x 5 minutes, had some right shoulder pain, so the last few minutes did legs only      Knee/Hip Exercises: Machines for Strengthening   Cybex Knee Flexion  20# 2x10               PT Short Term Goals - 10/08/18 1725      PT SHORT TERM GOAL #1   Title  decrease TUG time to 20 seconds    Time  4    Period  Weeks    Status  New        PT Long Term Goals - 10/08/18 1710      PT LONG TERM GOAL #1   Title  walk with SPC for all in house activities    Time  8    Period  Weeks    Status  New      PT LONG TERM GOAL #2   Title  increase right hip flexion in standing to 90 degrees    Time  8    Period  Weeks    Status  New      PT LONG TERM GOAL #3   Title  drive without any worries    Time  8    Period  Weeks    Status  New      PT LONG TERM GOAL #4   Title  able to return to grocery shopping    Time  8    Period  Weeks    Status  New      PT LONG TERM GOAL #5   Title  increase right hip strength to 4+/5    Time  8    Period  Weeks    Status  New             Plan - 10/08/18 1721    Clinical Impression Statement  Patient fell in April and fractured her right hip, she underwent a right IM nailing in late April.  She reports that she is not happy with her current level of function, she lives alone and is having difficulty walking, she has not been able to drive or shop.  she really does not trust the right leg and c/o the knee being unstable.  She did not like putting weight on this right leg due to this.  Her Tug time was 30 seconds.    Stability/Clinical Decision  Making  Evolving/Moderate complexity    Clinical Decision Making  Low    Rehab Potential  Good    PT Frequency  2x / week    PT Duration  8 weeks    PT Treatment/Interventions  ADLs/Self Care Home Management;Electrical Stimulation;Functional mobility training;Stair training;Gait training;Therapeutic activities;Therapeutic exercise;Balance training;Neuromuscular re-education;Manual techniques;Patient/family education    PT Next Visit Plan  slowly add exercises, be careful with the knee, she had some increased pain today after the leg extension, so we may need to avoid that    Consulted and Agree with Plan of Care  Patient       Patient will benefit from skilled therapeutic intervention in order to improve the following deficits and impairments:  Abnormal gait, Cardiopulmonary status limiting activity, Decreased mobility, Decreased activity tolerance, Decreased endurance, Decreased range of motion, Decreased strength, Decreased balance, Difficulty walking, Pain  Visit Diagnosis: Difficulty in walking, not elsewhere classified - Plan: PT plan of care cert/re-cert  Pain in right hip - Plan: PT plan of care cert/re-cert  Muscle weakness (generalized) - Plan: PT plan of care cert/re-cert     Problem List Patient Active Problem List   Diagnosis Date Noted  . Slow transit constipation   . Labile blood pressure   . Postoperative pain   . Displaced fracture of right femoral neck ([place]) 05/14/2018  . Essential hypertension   . Hypothyroidism   . Acute blood loss anemia 05/13/2018  . Obesity, Class III, BMI 40-49.9 (morbid obesity) ([place]) 05/13/2018  . Hip fracture ([place]) 05/10/2018  . Colon cancer ([place]) 02/27/2013  . Essential hypertension, benign 02/27/2013  . Glaucoma 02/27/2013  . Ulcerative colitis ([place]) 02/27/2013  . Diabetes ([place])     Sumner Boast., PT 10/08/2018, 5:28 PM  [place]  [place], [place], [address] Phone: [phone]   Fax:  [phone]  Name: Jenna Chambers MRN: [id number] Date of Birth: 1931-04-22

## 2018-10-14 ENCOUNTER — Other Ambulatory Visit: Payer: Self-pay

## 2018-10-14 ENCOUNTER — Ambulatory Visit: Payer: Medicare Other | Admitting: Physical Therapy

## 2018-10-14 ENCOUNTER — Encounter: Payer: Self-pay | Admitting: Physical Therapy

## 2018-10-14 DIAGNOSIS — R262 Difficulty in walking, not elsewhere classified: Secondary | ICD-10-CM | POA: Diagnosis not present

## 2018-10-14 DIAGNOSIS — M6281 Muscle weakness (generalized): Secondary | ICD-10-CM

## 2018-10-14 DIAGNOSIS — R52 Pain, unspecified: Secondary | ICD-10-CM | POA: Diagnosis not present

## 2018-10-14 DIAGNOSIS — M25551 Pain in right hip: Secondary | ICD-10-CM | POA: Diagnosis not present

## 2018-10-14 NOTE — Therapy (Signed)
[place], [place], [address] Phone: [phone]   Fax:  [phone]  Physical Therapy Treatment  Patient Details  Name: Jenna Chambers MRN: [id number] Date of Birth: 1931/10/23 Referring Provider (PT): Victorino December   Encounter Date: 10/14/2018  PT End of Session - 10/14/18 1557    Visit Number  2    Date for PT Re-Evaluation  12/08/18    PT Start Time  1517    PT Stop Time  1600    PT Time Calculation (min)  43 min    Activity Tolerance  Patient tolerated treatment well    Behavior During Therapy  [facility] for tasks assessed/performed       Past Medical History:  Diagnosis Date  . Colon cancer ([place])    resecetion  . Diabetes ([place])    Type 2  . Glaucoma    --both eyes per patient getting worse  . Gout   . Hypertension   . Osteopenia   . Sleep apnea    no CPAP  . Ulcerative colitis [facility])     Past Surgical History:  Procedure Laterality Date  . BILATERAL SALPINGOOPHORECTOMY  1993   serous cystadenoma  . CATARACT EXTRACTION  2006, 2008   gluacoma  . EXPLORATORY LAPAROTOMY  1996   incarcerated hernia  . INTRAMEDULLARY (IM) NAIL INTERTROCHANTERIC Right 05/10/2018   Procedure: INTRAMEDULLARY (IM) NAIL INTERTROCHANTRIC;  Surgeon: [name], MD;  Location: [place];  Service: Orthopedics;  Laterality: Right;  . PARTIAL COLECTOMY  1991  . TOTAL KNEE ARTHROPLASTY Left 2010    There were no vitals filed for this visit.  Subjective Assessment - 10/14/18 1521    Subjective  "Ok I guess"    Currently in Pain?  Yes    Pain Score  2     Pain Location  Back    Pain Orientation  Lower                       [facility] Adult PT Treatment/Exercise - 10/14/18 0001      High Level Balance   High Level Balance Activities  Side stepping    High Level Balance Comments  HHA x2      Knee/Hip Exercises: Aerobic   Nustep  L3 x 6 min       Knee/Hip Exercises: Standing   Other  Standing Knee Exercises  In RW R hip flex x10 L hip 2x3,     Other Standing Knee Exercises  Hip Abd with RW 2x5 each, In RW alt 4in boc taps x10       Knee/Hip Exercises: Seated   Long Arc Quad  Both;2 sets;10 reps    Long Arc Quad Weight  2 lbs.    Ball Squeeze  2x10    Marching  2 sets;Both;10 reps;Strengthening    Marching Weights  2 lbs.    Hamstring Curl  Both;2 sets;15 reps    Hamstring Limitations  green Tband     Abduction/Adduction   2 sets;15 reps    Abd/Adduction Limitations  green    Sit to Sand  2 sets;5 reps;with UE support               PT Short Term Goals - 10/08/18 1725      PT SHORT TERM GOAL #1   Title  decrease TUG time to 20 seconds    Time  4    Period  Weeks    Status  New        PT Long Term Goals - 10/08/18 1710      PT LONG TERM GOAL #1   Title  walk with SPC for all in house activities    Time  8    Period  Weeks    Status  New      PT LONG TERM GOAL #2   Title  increase right hip flexion in standing to 90 degrees    Time  8    Period  Weeks    Status  New      PT LONG TERM GOAL #3   Title  drive without any worries    Time  8    Period  Weeks    Status  New      PT LONG TERM GOAL #4   Title  able to return to grocery shopping    Time  8    Period  Weeks    Status  New      PT LONG TERM GOAL #5   Title  increase right hip strength to 4+/5    Time  8    Period  Weeks    Status  New            Plan - 10/14/18 1600    Clinical Impression Statement  Pt tolerated an initial progression to TE well evident by no subjective reports of increase pain. She does have some uncertainty with the RLE when having to do SLS during a phase of the exercises. Cues needed not to dead RLE with side steps.    Stability/Clinical Decision Making  Evolving/Moderate complexity    Rehab Potential  Good    PT Frequency  2x / week    PT Duration  8 weeks    PT Treatment/Interventions  ADLs/Self Care Home Management;Electrical  Stimulation;Functional mobility training;Stair training;Gait training;Therapeutic activities;Therapeutic exercise;Balance training;Neuromuscular re-education;Manual techniques;Patient/family education    PT Next Visit Plan  slowly add exercises       Patient will benefit from skilled therapeutic intervention in order to improve the following deficits and impairments:  Abnormal gait, Cardiopulmonary status limiting activity, Decreased mobility, Decreased activity tolerance, Decreased endurance, Decreased range of motion, Decreased strength, Decreased balance, Difficulty walking, Pain  Visit Diagnosis: Muscle weakness (generalized)  Difficulty in walking, not elsewhere classified  Pain  Pain in right hip     Problem List Patient Active Problem List   Diagnosis Date Noted  . Slow transit constipation   . Labile blood pressure   . Postoperative pain   . Displaced fracture of right femoral neck ([place]) 05/14/2018  . Essential hypertension   . Hypothyroidism   . Acute blood loss anemia 05/13/2018  . Obesity, Class III, BMI 40-49.9 (morbid obesity) ([place]) 05/13/2018  . Hip fracture ([place]) 05/10/2018  . Colon cancer ([place]) 02/27/2013  . Essential hypertension, benign 02/27/2013  . Glaucoma 02/27/2013  . Ulcerative colitis ([place]) 02/27/2013  . Diabetes ([place])     Scot Jun, PTA 10/14/2018, 4:06 PM  [place], [place], [address] Phone: [phone]   Fax:  [phone]  Name: Jenna Chambers MRN: [id number] Date of Birth: 1931/10/02

## 2018-10-16 ENCOUNTER — Other Ambulatory Visit: Payer: Self-pay

## 2018-10-16 ENCOUNTER — Ambulatory Visit: Payer: Medicare Other | Attending: Orthopedic Surgery | Admitting: Physical Therapy

## 2018-10-16 ENCOUNTER — Encounter: Payer: Self-pay | Admitting: Physical Therapy

## 2018-10-16 ENCOUNTER — Ambulatory Visit: Payer: Medicare Other | Admitting: Physical Therapy

## 2018-10-16 DIAGNOSIS — R52 Pain, unspecified: Secondary | ICD-10-CM | POA: Diagnosis not present

## 2018-10-16 DIAGNOSIS — M25551 Pain in right hip: Secondary | ICD-10-CM

## 2018-10-16 DIAGNOSIS — M6281 Muscle weakness (generalized): Secondary | ICD-10-CM | POA: Diagnosis not present

## 2018-10-16 DIAGNOSIS — R262 Difficulty in walking, not elsewhere classified: Secondary | ICD-10-CM | POA: Diagnosis not present

## 2018-10-16 NOTE — Therapy (Signed)
[place], [place], [address] Phone: [phone]   Fax:  [phone]  Physical Therapy Treatment  Patient Details  Name: Jenna Chambers MRN: [id number] Date of Birth: [birth year] Referring Provider (PT): Victorino December   Encounter Date: 10/16/2018  PT End of Session - 10/16/18 1509    Visit Number  3    Date for PT Re-Evaluation  12/08/18    PT Start Time  1430    PT Stop Time  1510    PT Time Calculation (min)  40 min    Activity Tolerance  Patient tolerated treatment well    Behavior During Therapy  [facility] for tasks assessed/performed       Past Medical History:  Diagnosis Date  . Colon cancer ([place])    resecetion  . Diabetes ([place])    Type 2  . Glaucoma    --both eyes per patient getting worse  . Gout   . Hypertension   . Osteopenia   . Sleep apnea    no CPAP  . Ulcerative colitis [facility]     Past Surgical History:  Procedure Laterality Date  . BILATERAL SALPINGOOPHORECTOMY  1993   serous cystadenoma  . CATARACT EXTRACTION  2006, 2008   gluacoma  . EXPLORATORY LAPAROTOMY  1996   incarcerated hernia  . INTRAMEDULLARY (IM) NAIL INTERTROCHANTERIC Right 05/10/2018   Procedure: INTRAMEDULLARY (IM) NAIL INTERTROCHANTRIC;  Surgeon: [name], MD;  Location: [facility];  Service: Orthopedics;  Laterality: Right;  . PARTIAL COLECTOMY  1991  . TOTAL KNEE ARTHROPLASTY Left 2010    There were no vitals filed for this visit.  Subjective Assessment - 10/16/18 1431    Subjective  "OK" was tired after last session but ok    Currently in Pain?  Yes    Pain Score  4     Pain Location  Back                       [facility] Adult PT Treatment/Exercise - 10/16/18 0001      High Level Balance   High Level Balance Activities  Side stepping    High Level Balance Comments  HHA x2      Knee/Hip Exercises: Aerobic   Nustep  L3 x 6 min       Knee/Hip Exercises: Standing   Other  Standing Knee Exercises  In RW R hip flex x10 L hip 2x5       Knee/Hip Exercises: Seated   Long Arc Quad  Both;2 sets;10 reps    Long Arc Quad Weight  2 lbs.    Ball Squeeze  2x10    Other Seated Knee/Hip Exercises  Fitter presses 2 blue RLE 2x15     Marching  2 sets;Both;10 reps;Strengthening    Marching Weights  2 lbs.    Hamstring Curl  Both;2 sets;15 reps    Hamstring Limitations  green Tband     Abduction/Adduction   2 sets;15 reps    Abd/Adduction Limitations  green    Sit to Sand  1 set;5 reps;with UE support               PT Short Term Goals - 10/08/18 1725      PT SHORT TERM GOAL #1   Title  decrease TUG time to 20 seconds    Time  4    Period  Weeks    Status  New  PT Long Term Goals - 10/16/18 1525      PT LONG TERM GOAL #1   Title  walk with SPC for all in house activities    Status  On-going      PT LONG TERM GOAL #2   Title  increase right hip flexion in standing to 90 degrees    Status  On-going      PT LONG TERM GOAL #3   Title  drive without any worries    Status  On-going            Plan - 10/16/18 1512    Clinical Impression Statement  Pt remains very hesitant and fear full of interventions that require her to stand on her RLE only. She reports that her RLE feel like it will go out on her but she is not sure if the issue is her hip or knee. Cues not do drag LLE with side steps and to bear weight through RLE.  Cues to squeeze R quad with LAQ.    Stability/Clinical Decision Making  Evolving/Moderate complexity    Rehab Potential  Good    PT Frequency  2x / week    PT Duration  8 weeks    PT Treatment/Interventions  ADLs/Self Care Home Management;Electrical Stimulation;Functional mobility training;Stair training;Gait training;Therapeutic activities;Therapeutic exercise;Balance training;Neuromuscular re-education;Manual techniques;Patient/family education    PT Next Visit Plan  slowly add exercises       Patient will benefit  from skilled therapeutic intervention in order to improve the following deficits and impairments:  Abnormal gait, Cardiopulmonary status limiting activity, Decreased mobility, Decreased activity tolerance, Decreased endurance, Decreased range of motion, Decreased strength, Decreased balance, Difficulty walking, Pain  Visit Diagnosis: Muscle weakness (generalized)  Difficulty in walking, not elsewhere classified  Pain  Pain in right hip     Problem List Patient Active Problem List   Diagnosis Date Noted  . Slow transit constipation   . Labile blood pressure   . Postoperative pain   . Displaced fracture of right femoral neck ([place]) 05/14/2018  . Essential hypertension   . Hypothyroidism   . Acute blood loss anemia 05/13/2018  . Obesity, Class III, BMI 40-49.9 (morbid obesity) ([place]) 05/13/2018  . Hip fracture ([place]) 05/10/2018  . Colon cancer ([place]) 02/27/2013  . Essential hypertension, benign 02/27/2013  . Glaucoma 02/27/2013  . Ulcerative colitis ([place]) 02/27/2013  . Diabetes ([place])     Scot Jun, PTA 10/16/2018, 3:26 PM  [place], [place], [address] Phone: [phone]   Fax:  [phone]  Name: Jenna Chambers MRN: [id number] Date of Birth: [birth year]

## 2018-10-21 ENCOUNTER — Other Ambulatory Visit: Payer: Self-pay

## 2018-10-21 ENCOUNTER — Encounter: Payer: Self-pay | Admitting: Physical Therapy

## 2018-10-21 ENCOUNTER — Ambulatory Visit: Payer: Medicare Other | Admitting: Physical Therapy

## 2018-10-21 DIAGNOSIS — R262 Difficulty in walking, not elsewhere classified: Secondary | ICD-10-CM | POA: Diagnosis not present

## 2018-10-21 DIAGNOSIS — R52 Pain, unspecified: Secondary | ICD-10-CM | POA: Diagnosis not present

## 2018-10-21 DIAGNOSIS — M25551 Pain in right hip: Secondary | ICD-10-CM

## 2018-10-21 DIAGNOSIS — M6281 Muscle weakness (generalized): Secondary | ICD-10-CM | POA: Diagnosis not present

## 2018-10-21 NOTE — Therapy (Signed)
[place], [place], [address] Phone: [phone]   Fax:  [phone]  Physical Therapy Treatment  Patient Details  Name: Jenna Chambers MRN: [id number] Date of Birth: [birth year] Referring Provider (PT): Victorino December   Encounter Date: 10/21/2018  PT End of Session - 10/21/18 1558    Visit Number  4    Date for PT Re-Evaluation  12/08/18    PT Start Time  [id number]    PT Stop Time  1559    PT Time Calculation (min)  44 min    Activity Tolerance  Patient tolerated treatment well    Behavior During Therapy  [facility] for tasks assessed/performed       Past Medical History:  Diagnosis Date  . Colon cancer ([place])    resecetion  . Diabetes ([place])    Type 2  . Glaucoma    --both eyes per patient getting worse  . Gout   . Hypertension   . Osteopenia   . Sleep apnea    no CPAP  . Ulcerative colitis [facility])     Past Surgical History:  Procedure Laterality Date  . BILATERAL SALPINGOOPHORECTOMY  1993   serous cystadenoma  . CATARACT EXTRACTION  2006, 2008   gluacoma  . EXPLORATORY LAPAROTOMY  1996   incarcerated hernia  . INTRAMEDULLARY (IM) NAIL INTERTROCHANTERIC Right 05/10/2018   Procedure: INTRAMEDULLARY (IM) NAIL INTERTROCHANTRIC;  Surgeon: [name], MD;  Location: [facility];  Service: Orthopedics;  Laterality: Right;  . PARTIAL COLECTOMY  1991  . TOTAL KNEE ARTHROPLASTY Left 2010    There were no vitals filed for this visit.  Subjective Assessment - 10/21/18 1516    Subjective  "Pretty good"    Currently in Pain?  Yes    Pain Score  3     Pain Location  Back    Pain Orientation  Lower                       [facility] Adult PT Treatment/Exercise - 10/21/18 0001      Ambulation/Gait   Ambulation/Gait  Yes    Ambulation/Gait Assistance  4: Min guard    Ambulation Distance (Feet)  40 Feet    Assistive device  1 person hand held assist    Gait Pattern  Step-through  pattern;Decreased stance time - right;Decreased step length - right    Ambulation Surface  Level;Indoor      Knee/Hip Exercises: Aerobic   Nustep  L4 x 6 min       Knee/Hip Exercises: Machines for Strengthening   Cybex Leg Press  20lb 2x10      Knee/Hip Exercises: Standing   Other Standing Knee Exercises  Standing march HHA x10 each       Knee/Hip Exercises: Seated   Long Arc Quad  Both;2 sets;10 reps    Long Arc Quad Weight  3 lbs.    Marching  2 sets;Both;10 reps;Strengthening    Marching Weights  2 lbs.    Hamstring Curl  Both;2 sets;10 reps    Hamstring Limitations  blue Tband    Sit to Sand  2 sets;10 reps;without UE support   airex on mat table               PT Short Term Goals - 10/08/18 1725      PT SHORT TERM GOAL #1   Title  decrease TUG time to 20 seconds  Time  4    Period  Weeks    Status  New        PT Long Term Goals - 10/16/18 1525      PT LONG TERM GOAL #1   Title  walk with SPC for all in house activities    Status  On-going      PT LONG TERM GOAL #2   Title  increase right hip flexion in standing to 90 degrees    Status  On-going      PT LONG TERM GOAL #3   Title  drive without any worries    Status  On-going            Plan - 10/21/18 1600    Clinical Impression Statement  Progressed with some gait with HHA, decrease stance time on RLE. She ambulates with a limp, but reports no pain, only uncertainty.  She is afraid and hesitant with all interventions that requires her to stand on RLE only. Cues to lean towards the R with sit to stands.    Stability/Clinical Decision Making  Evolving/Moderate complexity    Rehab Potential  Good    PT Frequency  2x / week    PT Duration  8 weeks    PT Treatment/Interventions  ADLs/Self Care Home Management;Electrical Stimulation;Functional mobility training;Stair training;Gait training;Therapeutic activities;Therapeutic exercise;Balance training;Neuromuscular re-education;Manual  techniques;Patient/family education    PT Next Visit Plan  LE strengthening, and gait       Patient will benefit from skilled therapeutic intervention in order to improve the following deficits and impairments:  Abnormal gait, Cardiopulmonary status limiting activity, Decreased mobility, Decreased activity tolerance, Decreased endurance, Decreased range of motion, Decreased strength, Decreased balance, Difficulty walking, Pain  Visit Diagnosis: Muscle weakness (generalized)  Difficulty in walking, not elsewhere classified  Pain  Pain in right hip     Problem List Patient Active Problem List   Diagnosis Date Noted  . Slow transit constipation   . Labile blood pressure   . Postoperative pain   . Displaced fracture of right femoral neck ([place]) 05/14/2018  . Essential hypertension   . Hypothyroidism   . Acute blood loss anemia 05/13/2018  . Obesity, Class III, BMI 40-49.9 (morbid obesity) ([place]) 05/13/2018  . Hip fracture ([place]) 05/10/2018  . Colon cancer ([place]) 02/27/2013  . Essential hypertension, benign 02/27/2013  . Glaucoma 02/27/2013  . Ulcerative colitis ([place]) 02/27/2013  . Diabetes ([place])     Scot Jun, PTA 10/21/2018, 4:03 PM  [place], [place], [address] Phone: [phone]   Fax:  [phone]  Name: Jenna Chambers MRN: [id number] Date of Birth: [birth year]

## 2018-10-22 DIAGNOSIS — Z23 Encounter for immunization: Secondary | ICD-10-CM | POA: Diagnosis not present

## 2018-10-23 ENCOUNTER — Ambulatory Visit: Payer: Medicare Other | Admitting: Physical Therapy

## 2018-10-23 ENCOUNTER — Other Ambulatory Visit: Payer: Self-pay

## 2018-10-23 ENCOUNTER — Encounter: Payer: Self-pay | Admitting: Physical Therapy

## 2018-10-23 DIAGNOSIS — M6281 Muscle weakness (generalized): Secondary | ICD-10-CM

## 2018-10-23 DIAGNOSIS — R52 Pain, unspecified: Secondary | ICD-10-CM | POA: Diagnosis not present

## 2018-10-23 DIAGNOSIS — M25551 Pain in right hip: Secondary | ICD-10-CM

## 2018-10-23 DIAGNOSIS — R262 Difficulty in walking, not elsewhere classified: Secondary | ICD-10-CM

## 2018-10-23 NOTE — Therapy (Signed)
[place], [place], [address] Phone: [phone]   Fax:  [phone]  Physical Therapy Treatment  Patient Details  Name: Jenna Chambers MRN: [id number] Date of Birth: [birth year] Referring Provider (PT): Victorino December   Encounter Date: 10/23/2018  PT End of Session - 10/23/18 1508    Visit Number  5    Date for PT Re-Evaluation  12/08/18    PT Start Time  1430    PT Stop Time  1512    PT Time Calculation (min)  42 min       Past Medical History:  Diagnosis Date  . Colon cancer ([place])    resecetion  . Diabetes ([place])    Type 2  . Glaucoma    --both eyes per patient getting worse  . Gout   . Hypertension   . Osteopenia   . Sleep apnea    no CPAP  . Ulcerative colitis [facility])     Past Surgical History:  Procedure Laterality Date  . BILATERAL SALPINGOOPHORECTOMY  1993   serous cystadenoma  . CATARACT EXTRACTION  2006, 2008   glu[place]  . EXPLORATORY LAPAROTOMY  1996   incarcerated hernia  . INTRAMEDULLARY (IM) NAIL INTERTROCHANTERIC Right 05/10/2018   Procedure: INTRAMEDULLARY (IM) NAIL INTERTROCHANTRIC;  Surgeon: [name], MD;  Location: [facility];  Service: Orthopedics;  Laterality: Right;  . PARTIAL COLECTOMY  1991  . TOTAL KNEE ARTHROPLASTY Left 2010    There were no vitals filed for this visit.  Subjective Assessment - 10/23/18 1432    Subjective  Pt reports that she was sore yesterday. pretty good today    Currently in Pain?  Yes    Pain Score  3     Pain Location  Back    Pain Orientation  Left;Lower                       [facility] Adult PT Treatment/Exercise - 10/23/18 0001      Knee/Hip Exercises: Aerobic   Recumbent Bike  L0 x3 min     Nustep  L3 x 5 min       Knee/Hip Exercises: Machines for Strengthening   Cybex Knee Extension  5lb 2x10     Cybex Knee Flexion  20# 2x10    Cybex Leg Press  20lb 2x10      Knee/Hip Exercises: Standing   Forward  Step Up  Right;10 reps;Step Height: 4";2 sets    Other Standing Knee Exercises  Standing march HHA x10 each       Knee/Hip Exercises: Seated   Long Arc Quad  2 sets;10 reps;Right    Long Arc Quad Weight  3 lbs.    Ball Squeeze  2x10               PT Short Term Goals - 10/23/18 1508      PT SHORT TERM GOAL #1   Title  decrease TUG time to 20 seconds    Status  On-going        PT Long Term Goals - 10/23/18 1508      PT LONG TERM GOAL #1   Title  walk with SPC for all in house activities    Status  On-going      PT LONG TERM GOAL #2   Title  increase right hip flexion in standing to 90 degrees    Status  On-going  PT LONG TERM GOAL #3   Title  drive without any worries    Status  On-going            Plan - 10/23/18 1515    Clinical Impression Statement  Pt completed all of today's interventions. Progressed to some more machine level strengthening interventions. She remains very fearful to bear with in her R leg. Decrease stance time on RLE with ambulation and exercises. Heavy UE use needed with step ups.    Stability/Clinical Decision Making  Evolving/Moderate complexity    Rehab Potential  Good    PT Frequency  2x / week    PT Duration  8 weeks    PT Treatment/Interventions  ADLs/Self Care Home Management;Electrical Stimulation;Functional mobility training;Stair training;Gait training;Therapeutic activities;Therapeutic exercise;Balance training;Neuromuscular re-education;Manual techniques;Patient/family education    PT Next Visit Plan  LE strengthing, and gait       Patient will benefit from skilled therapeutic intervention in order to improve the following deficits and impairments:  Abnormal gait, Cardiopulmonary status limiting activity, Decreased mobility, Decreased activity tolerance, Decreased endurance, Decreased range of motion, Decreased strength, Decreased balance, Difficulty walking, Pain  Visit Diagnosis: Difficulty in walking, not elsewhere  classified  Pain  Pain in right hip  Muscle weakness (generalized)     Problem List Patient Active Problem List   Diagnosis Date Noted  . Slow transit constipation   . Labile blood pressure   . Postoperative pain   . Displaced fracture of right femoral neck ([place]) 05/14/2018  . Essential hypertension   . Hypothyroidism   . Acute blood loss anemia 05/13/2018  . Obesity, Class III, BMI 40-49.9 (morbid obesity) ([place]) 05/13/2018  . Hip fracture ([place]) 05/10/2018  . Colon cancer ([place]) 02/27/2013  . Essential hypertension, benign 02/27/2013  . Glaucoma 02/27/2013  . Ulcerative colitis ([place]) 02/27/2013  . Diabetes ([place])     Scot Jun, PTA 10/23/2018, 3:20 PM  [place], [place], [address] Phone: [phone]   Fax:  [phone]  Name: Jenna Chambers MRN: [id number] Date of Birth: 25-Jul-1931

## 2018-10-28 ENCOUNTER — Other Ambulatory Visit: Payer: Self-pay

## 2018-10-28 ENCOUNTER — Ambulatory Visit: Payer: Medicare Other | Admitting: Physical Therapy

## 2018-10-28 DIAGNOSIS — M6281 Muscle weakness (generalized): Secondary | ICD-10-CM | POA: Diagnosis not present

## 2018-10-28 DIAGNOSIS — R262 Difficulty in walking, not elsewhere classified: Secondary | ICD-10-CM

## 2018-10-28 DIAGNOSIS — M25551 Pain in right hip: Secondary | ICD-10-CM | POA: Diagnosis not present

## 2018-10-28 DIAGNOSIS — R52 Pain, unspecified: Secondary | ICD-10-CM | POA: Diagnosis not present

## 2018-10-28 NOTE — Therapy (Signed)
Jenna Chambers, [place], [address] Phone: [phone]   Fax:  [phone]  Physical Therapy Treatment  Patient Details  Name: Jenna Chambers MRN: [id number] Date of Birth: [birth year] Referring Provider (PT): Victorino December   Encounter Date: 10/28/2018  PT End of Session - 10/28/18 1531    Visit Number  6    PT Start Time  [id number]    PT Stop Time  [id number]    PT Time Calculation (min)  41 min    Activity Tolerance  Patient tolerated treatment well       Past Medical History:  Diagnosis Date  . Colon cancer ([place])    resecetion  . Diabetes ([place])    Type 2  . Glaucoma    --both eyes per patient getting worse  . Gout   . Hypertension   . Osteopenia   . Sleep apnea    no CPAP  . Ulcerative colitis [facility])     Past Surgical History:  Procedure Laterality Date  . BILATERAL SALPINGOOPHORECTOMY  1993   serous cystadenoma  . CATARACT EXTRACTION  2006, 2008   gluacoma  . EXPLORATORY LAPAROTOMY  1996   incarcerated hernia  . INTRAMEDULLARY (IM) NAIL INTERTROCHANTERIC Right 05/10/2018   Procedure: INTRAMEDULLARY (IM) NAIL INTERTROCHANTRIC;  Surgeon: [name], MD;  Location: [facility];  Service: Orthopedics;  Laterality: Right;  . PARTIAL COLECTOMY  1991  . TOTAL KNEE ARTHROPLASTY Left 2010    There were no vitals filed for this visit.  Subjective Assessment - 10/28/18 1447    Subjective  Pt reports pain in the back and right hip.    Currently in Pain?  Yes    Pain Score  3                        [facility] Adult PT Treatment/Exercise - 10/28/18 0001      Knee/Hip Exercises: Aerobic   Recumbent Bike  L0 x5 min     Nustep  L3 x 5 min       Knee/Hip Exercises: Machines for Strengthening   Cybex Knee Extension  5lb 2x10     Cybex Knee Flexion  20# 2x10    Cybex Leg Press  20lb 2x10      Knee/Hip Exercises: Standing   Forward Step Up  Right;10 reps;Step Height: 4";2 sets    Other Standing Knee Exercises  Standing march HHA x10 each       Knee/Hip Exercises: Seated   Long Arc Quad  2 sets;10 reps;Both    Long Arc Quad Weight  3 lbs.    Ball Squeeze  2x10               PT Short Term Goals - 10/23/18 1508      PT SHORT TERM GOAL #1   Title  decrease TUG time to 20 seconds    Status  On-going        PT Long Term Goals - 10/23/18 1508      PT LONG TERM GOAL #1   Title  walk with SPC for all in house activities    Status  On-going      PT LONG TERM GOAL #2   Title  increase right hip flexion in standing to 90 degrees    Status  On-going      PT LONG TERM GOAL #3   Title  drive without any worries  Status  On-going            Plan - 10/28/18 1533    Clinical Impression Statement  Kept treatment the same as pt continues to need rest and cuing with current treatment. Heavy use of UE with step ups on rolling walker, attempted with holding rail pt unable to step up.    Rehab Potential  Good    PT Frequency  2x / week    PT Treatment/Interventions  ADLs/Self Care Home Management;Electrical Stimulation;Functional mobility training;Stair training;Gait training;Therapeutic activities;Therapeutic exercise;Balance training;Neuromuscular re-education;Manual techniques;Patient/family education    PT Next Visit Plan  LE strengthing, and gait, try single leg  press to advanced strength       Patient will benefit from skilled therapeutic intervention in order to improve the following deficits and impairments:     Visit Diagnosis: Difficulty in walking, not elsewhere classified  Muscle weakness (generalized)     Problem List Patient Active Problem List   Diagnosis Date Noted  . Slow transit constipation   . Labile blood pressure   . Postoperative pain   . Displaced fracture of right femoral neck ([place]) 05/14/2018  . Essential hypertension   . Hypothyroidism   . Acute blood loss anemia 05/13/2018  . Obesity, Class III, BMI 40-49.9  (morbid obesity) ([place]) 05/13/2018  . Hip fracture ([place]) 05/10/2018  . Colon cancer ([place]) 02/27/2013  . Essential hypertension, benign 02/27/2013  . Glaucoma 02/27/2013  . Ulcerative colitis ([place]) 02/27/2013  . Diabetes [facility])     Barrett Henle, [place] 10/28/2018, 3:41 PM  [place], [place], [address] Phone: [phone]   Fax:  [phone]  Name: Jenna Chambers MRN: [id number] Date of Birth: 1931/03/20

## 2018-10-29 DIAGNOSIS — S72001A Fracture of unspecified part of neck of right femur, initial encounter for closed fracture: Secondary | ICD-10-CM | POA: Diagnosis not present

## 2018-10-30 ENCOUNTER — Other Ambulatory Visit: Payer: Self-pay

## 2018-10-30 ENCOUNTER — Ambulatory Visit: Payer: Medicare Other | Admitting: Physical Therapy

## 2018-10-30 DIAGNOSIS — R52 Pain, unspecified: Secondary | ICD-10-CM | POA: Diagnosis not present

## 2018-10-30 DIAGNOSIS — M25551 Pain in right hip: Secondary | ICD-10-CM | POA: Diagnosis not present

## 2018-10-30 DIAGNOSIS — R262 Difficulty in walking, not elsewhere classified: Secondary | ICD-10-CM | POA: Diagnosis not present

## 2018-10-30 DIAGNOSIS — M6281 Muscle weakness (generalized): Secondary | ICD-10-CM

## 2018-10-30 NOTE — Therapy (Signed)
[place], [place], [address] Phone: [phone]   Fax:  [phone]  Physical Therapy Treatment  Patient Details  Name: Jenna Chambers MRN: [id number] Date of Birth: 1931-08-25 Referring Provider (PT): Victorino December   Encounter Date: 10/30/2018  PT End of Session - 10/30/18 1638    Visit Number  7    PT Start Time  [id number]    PT Stop Time  1529    PT Time Calculation (min)  47 min       Past Medical History:  Diagnosis Date  . Colon cancer ([place])    resecetion  . Diabetes ([place])    Type 2  . Glaucoma    --both eyes per patient getting worse  . Gout   . Hypertension   . Osteopenia   . Sleep apnea    no CPAP  . Ulcerative colitis [facility])     Past Surgical History:  Procedure Laterality Date  . BILATERAL SALPINGOOPHORECTOMY  1993   serous cystadenoma  . CATARACT EXTRACTION  2006, 2008   gluacoma  . EXPLORATORY LAPAROTOMY  1996   incarcerated hernia  . INTRAMEDULLARY (IM) NAIL INTERTROCHANTERIC Right 05/10/2018   Procedure: INTRAMEDULLARY (IM) NAIL INTERTROCHANTRIC;  Surgeon: [name], MD;  Location: [facility];  Service: Orthopedics;  Laterality: Right;  . PARTIAL COLECTOMY  1991  . TOTAL KNEE ARTHROPLASTY Left 2010    There were no vitals filed for this visit.  Subjective Assessment - 10/30/18 1443    Subjective  "pain in the right hip"    Currently in Pain?  Yes    Pain Score  3     Pain Location  Hip    Pain Orientation  Right                       [facility] Adult PT Treatment/Exercise - 10/30/18 0001      Knee/Hip Exercises: Aerobic   Recumbent Bike  L0 x5 min     Nustep  L3 x 5 min       Knee/Hip Exercises: Machines for Strengthening   Cybex Knee Extension  5lb 2x15    Cybex Knee Flexion  20# 2x15    Cybex Leg Press  20lb 2x10   [place] leg without weight     Knee/Hip Exercises: Standing   Hip Flexion  Stengthening;Both;2 sets;10 reps;Knee bent    with 3#   Hip Abduction  Stengthening;Both;2 sets;10 reps;Knee straight   with 3#, modified to side step on left side   Hip Extension  Stengthening;Both;2 sets;10 reps;Knee straight   with 3#     Knee/Hip Exercises: Seated   Marching  --               PT Short Term Goals - 10/30/18 1636      PT SHORT TERM GOAL #1   Title  decrease TUG time to 20 seconds    Status  On-going        PT Long Term Goals - 10/30/18 1637      PT LONG TERM GOAL #1   Title  walk with SPC for all in house activities    Status  On-going      PT LONG TERM GOAL #2   Title  increase right hip flexion in standing to 90 degrees    Status  On-going      PT LONG TERM GOAL #3   Title  drive  without any worries    Status  On-going            Plan - 10/30/18 1634    Clinical Impression Statement  Pt was progressed to single leg press with no weight. Heavy use of UE on walker with hip abduction and extension. Pt needed a couple rest breaks between exercises.    Rehab Potential  Good    PT Frequency  2x / week    PT Treatment/Interventions  ADLs/Self Care Home Management;Electrical Stimulation;Functional mobility training;Stair training;Gait training;Therapeutic activities;Therapeutic exercise;Balance training;Neuromuscular re-education;Manual techniques;Patient/family education       Patient will benefit from skilled therapeutic intervention in order to improve the following deficits and impairments:  Abnormal gait, Cardiopulmonary status limiting activity, Decreased mobility, Decreased activity tolerance, Decreased endurance, Decreased range of motion, Decreased strength, Decreased balance, Difficulty walking, Pain  Visit Diagnosis: Muscle weakness (generalized)  Difficulty in walking, not elsewhere classified     Problem List Patient Active Problem List   Diagnosis Date Noted  . Slow transit constipation   . Labile blood pressure   . Postoperative pain   . Displaced fracture of  right femoral neck ([place]) 05/14/2018  . Essential hypertension   . Hypothyroidism   . Acute blood loss anemia 05/13/2018  . Obesity, Class III, BMI 40-49.9 (morbid obesity) ([place]) 05/13/2018  . Hip fracture ([place]) 05/10/2018  . Colon cancer ([place]) 02/27/2013  . Essential hypertension, benign 02/27/2013  . Glaucoma 02/27/2013  . Ulcerative colitis ([place]) 02/27/2013  . Diabetes [facility])     Jenna Chambers, Jenna Chambers 10/30/2018, 4:44 PM  [place], [place], [address] Phone: [phone]   Fax:  [phone]  Name: Jenna Chambers MRN: [id number] Date of Birth: November 14, 1931

## 2018-11-04 ENCOUNTER — Ambulatory Visit: Payer: Medicare Other | Admitting: Physical Therapy

## 2018-11-04 ENCOUNTER — Other Ambulatory Visit: Payer: Self-pay

## 2018-11-04 DIAGNOSIS — R262 Difficulty in walking, not elsewhere classified: Secondary | ICD-10-CM

## 2018-11-04 DIAGNOSIS — R52 Pain, unspecified: Secondary | ICD-10-CM | POA: Diagnosis not present

## 2018-11-04 DIAGNOSIS — M25551 Pain in right hip: Secondary | ICD-10-CM | POA: Diagnosis not present

## 2018-11-04 DIAGNOSIS — M6281 Muscle weakness (generalized): Secondary | ICD-10-CM | POA: Diagnosis not present

## 2018-11-04 NOTE — Therapy (Signed)
[place], [place], [address] Phone: [phone]   Fax:  [phone]  Physical Therapy Treatment  Patient Details  Name: Jenna Chambers MRN: [id number] Date of Birth: Feb 15, 1931 Referring Provider (PT): Victorino December   Encounter Date: 11/04/2018  PT End of Session - 11/04/18 1537    Visit Number  8    PT Start Time  [id number]    PT Stop Time  [id number]    PT Time Calculation (min)  46 min       Past Medical History:  Diagnosis Date  . Colon cancer ([place])    resecetion  . Diabetes ([place])    Type 2  . Glaucoma    --both eyes per patient getting worse  . Gout   . Hypertension   . Osteopenia   . Sleep apnea    no CPAP  . Ulcerative colitis [facility])     Past Surgical History:  Procedure Laterality Date  . BILATERAL SALPINGOOPHORECTOMY  1993   serous cystadenoma  . CATARACT EXTRACTION  2006, 2008   gluacoma  . EXPLORATORY LAPAROTOMY  1996   incarcerated hernia  . INTRAMEDULLARY (IM) NAIL INTERTROCHANTERIC Right 05/10/2018   Procedure: INTRAMEDULLARY (IM) NAIL INTERTROCHANTRIC;  Surgeon: [name], MD;  Location: [facility];  Service: Orthopedics;  Laterality: Right;  . PARTIAL COLECTOMY  1991  . TOTAL KNEE ARTHROPLASTY Left 2010    There were no vitals filed for this visit.  Subjective Assessment - 11/04/18 1448    Subjective  Pt states that she had trouble getting up and moving around yesterday due to pain and that some pain remained this morning. " no pain now, but leg feels heavy"    Currently in Pain?  No/denies                       [facility] Adult PT Treatment/Exercise - 11/04/18 0001      Ambulation/Gait   Ambulation/Gait  Yes    Ambulation/Gait Assistance  4: Min guard    Ambulation Distance (Feet)  75 Feet    Assistive device  Straight cane    Gait Pattern  Step-to pattern    Ambulation Surface  Indoor;Level      Knee/Hip Exercises: Aerobic   Recumbent Bike  L0  x5 min     Nustep  L3 x 5 min       Knee/Hip Exercises: Machines for Strengthening   Cybex Knee Extension  5lb 2x15    Cybex Knee Flexion  20# 2x15    Cybex Leg Press  20lb 2x10      Knee/Hip Exercises: Standing   Forward Step Up  Right;10 reps;2 sets;Step Height: 6";Hand Hold: 2    Other Standing Knee Exercises  side stepping HHA, across the gym x3,               PT Short Term Goals - 10/30/18 1636      PT SHORT TERM GOAL #1   Title  decrease TUG time to 20 seconds    Status  On-going        PT Long Term Goals - 11/04/18 1450      PT LONG TERM GOAL #3   Title  drive without any worries            Plan - 11/04/18 1533    Clinical Impression Statement  Pt able to walk with SPC with no LOB. Pt requires rest  breaks after exercises due to fatigue. Pt needs cues to reduce use of UE with forward step ups. Pt able to side step across gym to toward goal of increasing L hip abductor strength.    Rehab Potential  Good    PT Frequency  2x / week    PT Duration  8 weeks    PT Treatment/Interventions  ADLs/Self Care Home Management;Electrical Stimulation;Functional mobility training;Stair training;Gait training;Therapeutic activities;Therapeutic exercise;Balance training;Neuromuscular re-education;Manual techniques;Patient/family education    PT Next Visit Plan  continue to work on LE strength and gait with [facility]       Patient will benefit from skilled therapeutic intervention in order to improve the following deficits and impairments:  Abnormal gait, Cardiopulmonary status limiting activity, Decreased mobility, Decreased activity tolerance, Decreased endurance, Decreased range of motion, Decreased strength, Decreased balance, Difficulty walking, Pain  Visit Diagnosis: Difficulty in walking, not elsewhere classified  Muscle weakness (generalized)     Problem List Patient Active Problem List   Diagnosis Date Noted  . Slow transit constipation   . Labile blood  pressure   . Postoperative pain   . Displaced fracture of right femoral neck ([place]) 05/14/2018  . Essential hypertension   . Hypothyroidism   . Acute blood loss anemia 05/13/2018  . Obesity, Class III, BMI 40-49.9 (morbid obesity) ([place]) 05/13/2018  . Hip fracture ([place]) 05/10/2018  . Colon cancer ([place]) 02/27/2013  . Essential hypertension, benign 02/27/2013  . Glaucoma 02/27/2013  . Ulcerative colitis ([place]) 02/27/2013  . Diabetes [facility])     Barrett Henle, [place] 11/04/2018, 3:44 PM  [place], [place], [address] Phone: [phone]   Fax:  [phone]  Name: Jenna Chambers MRN: [id number] Date of Birth: [birth year]

## 2018-11-06 ENCOUNTER — Ambulatory Visit: Payer: Medicare Other | Admitting: Physical Therapy

## 2018-11-06 ENCOUNTER — Other Ambulatory Visit: Payer: Self-pay

## 2018-11-06 DIAGNOSIS — R262 Difficulty in walking, not elsewhere classified: Secondary | ICD-10-CM

## 2018-11-06 DIAGNOSIS — M25551 Pain in right hip: Secondary | ICD-10-CM | POA: Diagnosis not present

## 2018-11-06 DIAGNOSIS — R52 Pain, unspecified: Secondary | ICD-10-CM | POA: Diagnosis not present

## 2018-11-06 DIAGNOSIS — M6281 Muscle weakness (generalized): Secondary | ICD-10-CM

## 2018-11-06 NOTE — Therapy (Signed)
[place], [place], [address] Phone: [phone]   Fax:  [phone]  Physical Therapy Treatment  Patient Details  Name: Jenna Chambers MRN: [id number] Date of Birth: 06-17-1931 Referring Provider (PT): Victorino December   Encounter Date: 11/06/2018  PT End of Session - 11/06/18 1446    Visit Number  9    PT Start Time  1751    PT Stop Time  1435    PT Time Calculation (min)  42 min       Past Medical History:  Diagnosis Date  . Colon cancer ([place])    resecetion  . Diabetes ([place])    Type 2  . Glaucoma    --both eyes per patient getting worse  . Gout   . Hypertension   . Osteopenia   . Sleep apnea    no CPAP  . Ulcerative colitis [facility])     Past Surgical History:  Procedure Laterality Date  . BILATERAL SALPINGOOPHORECTOMY  1993   serous cystadenoma  . CATARACT EXTRACTION  2006, 2008   gluacoma  . EXPLORATORY LAPAROTOMY  1996   incarcerated hernia  . INTRAMEDULLARY (IM) NAIL INTERTROCHANTERIC Right 05/10/2018   Procedure: INTRAMEDULLARY (IM) NAIL INTERTROCHANTRIC;  Surgeon: [name], MD;  Location: [facility];  Service: Orthopedics;  Laterality: Right;  . PARTIAL COLECTOMY  1991  . TOTAL KNEE ARTHROPLASTY Left 2010    There were no vitals filed for this visit.  Subjective Assessment - 11/06/18 1357    Subjective  "feeling pretty good' 'no pain, just heavy legs"    Currently in Pain?  No/denies                       [facility] Adult PT Treatment/Exercise - 11/06/18 0001      Ambulation/Gait   Ambulation/Gait  Yes    Ambulation/Gait Assistance  4: Min guard    Ambulation Distance (Feet)  90 Feet    Assistive device  Straight cane    Gait Pattern  Step-to pattern    Ambulation Surface  Level;Indoor      High Level Balance   High Level Balance Activities  Side stepping;Marching forwards   across the gym x2     Knee/Hip Exercises: Aerobic   Recumbent Bike   L0 x5 min     Nustep  L4 x 5 min       Knee/Hip Exercises: Machines for Strengthening   Cybex Knee Extension  10lb 2x10    Cybex Knee Flexion  20# 2x15               PT Short Term Goals - 11/06/18 1442      PT SHORT TERM GOAL #1   Title  decrease TUG time to 20 seconds    Status  Achieved        PT Long Term Goals - 11/06/18 1359      PT LONG TERM GOAL #1   Title  walk with SPC for all in house activities    Status  On-going      PT LONG TERM GOAL #2   Title  increase right hip flexion in standing to 90 degrees    Status  On-going      PT LONG TERM GOAL #4   Title  able to return to grocery shopping    Status  On-going      PT LONG TERM GOAL #5   Title  increase right hip strength to 4+/5    Status  Partially Met            Plan - 11/06/18 1442    Clinical Impression Statement  pt achieved goal of completing TUG in 28 seconds with walker. Pt able to perform TUG in 30 seconds with cane. Pt able to side step across gym but has weakness in the right hip and SOB. Pt continues to walk with SPC to work towards goal. Pt requires rest breaks between exercises secondary to fatigue.    Rehab Potential  Good    PT Frequency  2x / week    PT Duration  8 weeks    PT Treatment/Interventions  ADLs/Self Care Home Management;Electrical Stimulation;Functional mobility training;Stair training;Gait training;Therapeutic activities;Therapeutic exercise;Balance training;Neuromuscular re-education;Manual techniques;Patient/family education    PT Next Visit Plan  continue to work on LE strength and gait with [facility]       Patient will benefit from skilled therapeutic intervention in order to improve the following deficits and impairments:  Abnormal gait, Cardiopulmonary status limiting activity, Decreased mobility, Decreased activity tolerance, Decreased endurance, Decreased range of motion, Decreased strength, Decreased balance, Difficulty walking, Pain  Visit  Diagnosis: Difficulty in walking, not elsewhere classified  Muscle weakness (generalized)     Problem List Patient Active Problem List   Diagnosis Date Noted  . Slow transit constipation   . Labile blood pressure   . Postoperative pain   . Displaced fracture of right femoral neck ([place]) 05/14/2018  . Essential hypertension   . Hypothyroidism   . Acute blood loss anemia 05/13/2018  . Obesity, Class III, BMI 40-49.9 (morbid obesity) ([place]) 05/13/2018  . Hip fracture ([place]) 05/10/2018  . Colon cancer ([place]) 02/27/2013  . Essential hypertension, benign 02/27/2013  . Glaucoma 02/27/2013  . Ulcerative colitis ([place]) 02/27/2013  . Diabetes [facility])     Barrett Henle, [place] 11/06/2018, 2:48 PM  [place], [place], [address] Phone: [phone]   Fax:  [phone]  Name: Jenna Chambers MRN: [id number] Date of Birth: January 04, 1932

## 2018-11-11 ENCOUNTER — Other Ambulatory Visit: Payer: Self-pay

## 2018-11-11 ENCOUNTER — Ambulatory Visit: Payer: Medicare Other | Admitting: Physical Therapy

## 2018-11-11 DIAGNOSIS — M6281 Muscle weakness (generalized): Secondary | ICD-10-CM

## 2018-11-11 DIAGNOSIS — M25551 Pain in right hip: Secondary | ICD-10-CM | POA: Diagnosis not present

## 2018-11-11 DIAGNOSIS — R52 Pain, unspecified: Secondary | ICD-10-CM | POA: Diagnosis not present

## 2018-11-11 DIAGNOSIS — R262 Difficulty in walking, not elsewhere classified: Secondary | ICD-10-CM | POA: Diagnosis not present

## 2018-11-11 NOTE — Therapy (Addendum)
[address] [place], [place], [address] Phone: [phone]   Fax:  [phone] Progress Note Reporting Period  10/08/18 to 11/11/18 for the first 10 visits  See note below for Objective Data and Assessment of Progress/Goals.      Physical Therapy Treatment  Patient Details  Name: Jenna Chambers MRN: [id number] Date of Birth: [birth year] Referring Provider (PT): Victorino December   Encounter Date: 11/11/2018  PT End of Session - 11/11/18 1445    Visit Number  10    PT Start Time  1400    PT Stop Time  6962    PT Time Calculation (min)  43 min       Past Medical History:  Diagnosis Date  . Colon cancer ([place])    resecetion  . Diabetes ([place])    Type 2  . Glaucoma    --both eyes per patient getting worse  . Gout   . Hypertension   . Osteopenia   . Sleep apnea    no CPAP  . Ulcerative colitis [facility])     Past Surgical History:  Procedure Laterality Date  . BILATERAL SALPINGOOPHORECTOMY  1993   serous cystadenoma  . CATARACT EXTRACTION  2006, 2008   [place]  . EXPLORATORY LAPAROTOMY  1996   incarcerated hernia  . INTRAMEDULLARY (IM) NAIL INTERTROCHANTERIC Right 05/10/2018   Procedure: INTRAMEDULLARY (IM) NAIL INTERTROCHANTRIC;  Surgeon: [name], MD;  Location: [facility];  Service: Orthopedics;  Laterality: Right;  . PARTIAL COLECTOMY  1991  . TOTAL KNEE ARTHROPLASTY Left 2010    There were no vitals filed for this visit.  Subjective Assessment - 11/11/18 1359    Subjective  "doing well' "a little pain behind the knee after last session."    Currently in Pain?  Yes    Pain Score  2     Pain Location  Hip    Pain Orientation  Right                       [facility] Adult PT Treatment/Exercise - 11/11/18 0001      Ambulation/Gait   Ambulation/Gait  Yes    Ambulation/Gait Assistance  4: Min guard    Ambulation Distance (Feet)  110 Feet    Assistive device  Straight cane     Gait Pattern  Step-to pattern    Ambulation Surface  Level;Indoor      High Level Balance   High Level Balance Activities  Side stepping;Marching forwards    High Level Balance Comments  HHA x2      Knee/Hip Exercises: Aerobic   Nustep  L4 x 6 min       Knee/Hip Exercises: Machines for Strengthening   Cybex Knee Extension  10lb 2x15    Cybex Knee Flexion  20# 2x15    Cybex Leg Press  20lb 2x10, RLE only 2 x 10      Knee/Hip Exercises: Standing   Forward Step Up  Both;20 reps;Hand Hold: 2               PT Short Term Goals - 11/06/18 1442      PT SHORT TERM GOAL #1   Title  decrease TUG time to 20 seconds    Status  Achieved        PT Long Term Goals - 11/11/18 1442      PT LONG TERM GOAL #1   Title  walk with  SPC for all in house activities    Status  Partially Met      PT [place] #2   Title  increase right hip flexion in standing to 90 degrees    Status  On-going      PT LONG TERM GOAL #3   Title  drive without any worries    Status  Achieved      PT LONG TERM GOAL #4   Title  able to return to grocery shopping    Status  On-going      PT LONG TERM GOAL #5   Title  increase right hip strength to 4+/5    Status  Partially Met            Plan - 11/11/18 1446    Clinical Impression Statement  pt continues to work on walking with SPC. She is increasing in distance, but is still hesitant due to fear. Pt was very SOB. pt needs HHA to side step across gym. She has decreased weight bearing on right leg due to weakness. Pt requires rest breaks between exercises secondary to fatigue. Pt requires cues with step ups to reduce use of UE.    Rehab Potential  Good    PT Frequency  2x / week    PT Duration  8 weeks    PT Treatment/Interventions  ADLs/Self Care Home Management;Electrical Stimulation;Functional mobility training;Stair training;Gait training;Therapeutic activities;Therapeutic exercise;Balance training;Neuromuscular re-education;Manual  techniques;Patient/family education    PT Next Visit Plan  continue to work on LE strength and gait with [facility]       Patient will benefit from skilled therapeutic intervention in order to improve the following deficits and impairments:  Abnormal gait, Cardiopulmonary status limiting activity, Decreased mobility, Decreased activity tolerance, Decreased endurance, Decreased range of motion, Decreased strength, Decreased balance, Difficulty walking, Pain  Visit Diagnosis: Difficulty in walking, not elsewhere classified  Muscle weakness (generalized)     Problem List Patient Active Problem List   Diagnosis Date Noted  . Slow transit constipation   . Labile blood pressure   . Postoperative pain   . Displaced fracture of right femoral neck ([place]) 05/14/2018  . Essential hypertension   . Hypothyroidism   . Acute blood loss anemia 05/13/2018  . Obesity, Class III, BMI 40-49.9 (morbid obesity) ([place]) 05/13/2018  . Hip fracture ([place]) 05/10/2018  . Colon cancer ([place]) 02/27/2013  . Essential hypertension, benign 02/27/2013  . Glaucoma 02/27/2013  . Ulcerative colitis ([place]) 02/27/2013  . Diabetes [facility])     Barrett Henle, [place] 11/11/2018, 2:54 PM  [place] [address] [place], [place], [address] Phone: [phone]   Fax:  [phone]  Name: TAEGEN LENNOX MRN: [id number] Date of Birth: [birth year]

## 2018-11-13 ENCOUNTER — Ambulatory Visit: Payer: Medicare Other | Admitting: Physical Therapy

## 2018-11-18 ENCOUNTER — Ambulatory Visit: Payer: Medicare Other | Attending: Orthopedic Surgery | Admitting: Physical Therapy

## 2018-11-18 ENCOUNTER — Other Ambulatory Visit: Payer: Self-pay

## 2018-11-18 DIAGNOSIS — M25551 Pain in right hip: Secondary | ICD-10-CM | POA: Diagnosis not present

## 2018-11-18 DIAGNOSIS — R262 Difficulty in walking, not elsewhere classified: Secondary | ICD-10-CM | POA: Insufficient documentation

## 2018-11-18 DIAGNOSIS — R52 Pain, unspecified: Secondary | ICD-10-CM | POA: Insufficient documentation

## 2018-11-18 DIAGNOSIS — M6281 Muscle weakness (generalized): Secondary | ICD-10-CM | POA: Diagnosis not present

## 2018-11-18 NOTE — Therapy (Signed)
Jenna Chambers, [place], [address] Phone: [phone]   Fax:  [phone]  Physical Therapy Treatment  Patient Details  Name: Jenna Chambers MRN: [id number] Date of Birth: Jenna Chambers Referring Provider (PT): Jenna Chambers   Encounter Date: 11/18/2018  PT End of Session - 11/18/18 1450    Visit Number  11    PT Start Time  1400    PT Stop Time  1440    PT Time Calculation (min)  40 min       Past Medical History:  Diagnosis Date  . Colon cancer ([place])    resecetion  . Diabetes ([place])    Type 2  . Glaucoma    --both eyes per patient getting worse  . Gout   . Hypertension   . Osteopenia   . Sleep apnea    no CPAP  . Ulcerative colitis [facility])     Past Surgical History:  Procedure Laterality Date  . BILATERAL SALPINGOOPHORECTOMY  1993   serous cystadenoma  . CATARACT EXTRACTION  2006, 2008   gluacoma  . EXPLORATORY LAPAROTOMY  1996   incarcerated hernia  . INTRAMEDULLARY (IM) NAIL INTERTROCHANTERIC Right 05/10/2018   Procedure: INTRAMEDULLARY (IM) NAIL INTERTROCHANTRIC;  Surgeon: Jenna Stairs, MD;  Location: [facility];  Service: Orthopedics;  Laterality: Right;  . PARTIAL COLECTOMY  1991  . TOTAL KNEE ARTHROPLASTY Left 2010    There were no vitals filed for this visit.  Subjective Assessment - 11/18/18 1358    Subjective  "feeling pretty good, just the usual pain".    Currently in Pain?  Yes    Pain Location  Hip                       [facility] Adult PT Treatment/Exercise - 11/18/18 0001      High Level Balance   High Level Balance Activities  Side stepping;Negotitating around obstacles;Negotiating over obstacles   w/ cane    High Level Balance Comments  step up on airex over form roll x5 R side      Knee/Hip Exercises: Aerobic   Nustep  L4 x 6 min       Knee/Hip Exercises: Machines for Strengthening   Cybex Knee Extension  10lb 2x10    Cybex Knee Flexion  20#  2x10      Knee/Hip Exercises: Standing   Walking with Sports Cord  forward x 4 w, 20#               PT Short Term Goals - 11/06/18 1442      PT SHORT TERM GOAL #1   Title  decrease TUG time to 20 seconds    Status  Achieved        PT Long Term Goals - 11/11/18 1442      PT LONG TERM GOAL #1   Title  walk with SPC for all in house activities    Status  Partially Met      PT LONG TERM GOAL #2   Title  increase right hip flexion in standing to 90 degrees    Status  On-going      PT LONG TERM GOAL #3   Title  drive without any worries    Status  Achieved      PT LONG TERM GOAL #4   Title  able to return to grocery shopping    Status  On-going  PT LONG TERM GOAL #5   Title  increase right hip strength to 4+/5    Status  Partially Met            Plan - 11/18/18 1442    Clinical Impression Statement  pt continues to work on walking with SPC. pt able to negoiate over and around objects. pt needs HHA x 1 to step over foam rolls. pt able to side step over foam over to the right, but has difficulty stepping to the left.  pt needs cues to take wider steps with side stepping. pt progressed to forward resisted gait. weight with knee flex/ext was scaled back due to pt fatigue.    Stability/Clinical Decision Making  Evolving/Moderate complexity    Rehab Potential  Good    PT Frequency  2x / week    PT Duration  8 weeks    PT Next Visit Plan  continue to work on LE strength and gait with [facility]       Patient will benefit from skilled therapeutic intervention in order to improve the following deficits and impairments:  Abnormal gait, Cardiopulmonary status limiting activity, Decreased mobility, Decreased activity tolerance, Decreased endurance, Decreased range of motion, Decreased strength, Decreased balance, Difficulty walking, Pain  Visit Diagnosis: Difficulty in walking, not elsewhere classified  Muscle weakness (generalized)     Problem List Patient Active  Problem List   Diagnosis Date Noted  . Slow transit constipation   . Labile blood pressure   . Postoperative pain   . Displaced fracture of right femoral neck ([place]) 05/14/2018  . Essential hypertension   . Hypothyroidism   . Acute blood loss anemia 05/13/2018  . Obesity, Class III, BMI 40-49.9 (morbid obesity) ([place]) 05/13/2018  . Hip fracture ([place]) 05/10/2018  . Colon cancer ([place]) 02/27/2013  . Essential hypertension, benign 02/27/2013  . Glaucoma 02/27/2013  . Ulcerative colitis ([place]) 02/27/2013  . Diabetes [facility])     Jenna Chambers, [place] 11/18/2018, 3:46 PM  [place] [address] [place], [place], [address] Phone: [phone]   Fax:  [phone]  Name: Jenna Chambers MRN: [id number] Date of Birth: Jenna Chambers

## 2018-11-20 ENCOUNTER — Other Ambulatory Visit: Payer: Self-pay

## 2018-11-20 ENCOUNTER — Ambulatory Visit: Payer: Medicare Other | Admitting: Physical Therapy

## 2018-11-20 DIAGNOSIS — M25551 Pain in right hip: Secondary | ICD-10-CM | POA: Diagnosis not present

## 2018-11-20 DIAGNOSIS — M6281 Muscle weakness (generalized): Secondary | ICD-10-CM | POA: Diagnosis not present

## 2018-11-20 DIAGNOSIS — R52 Pain, unspecified: Secondary | ICD-10-CM | POA: Diagnosis not present

## 2018-11-20 DIAGNOSIS — R262 Difficulty in walking, not elsewhere classified: Secondary | ICD-10-CM | POA: Diagnosis not present

## 2018-11-20 NOTE — Therapy (Signed)
[place], [place], [address] Phone: [phone]   Fax:  [phone]  Physical Therapy Treatment  Patient Details  Name: Jenna Chambers MRN: [id number] Date of Birth: 1931/09/29 Referring Provider (PT): Victorino December   Encounter Date: 11/20/2018  PT End of Session - 11/20/18 1539    Visit Number  12    Date for PT Re-Evaluation  12/08/18    PT Start Time  6503    PT Stop Time  5465    PT Time Calculation (min)  59 min       Past Medical History:  Diagnosis Date  . Colon cancer ([place])    resecetion  . Diabetes ([place])    Type 2  . Glaucoma    --both eyes per patient getting worse  . Gout   . Hypertension   . Osteopenia   . Sleep apnea    no CPAP  . Ulcerative colitis [facility])     Past Surgical History:  Procedure Laterality Date  . BILATERAL SALPINGOOPHORECTOMY  1993   serous cystadenoma  . CATARACT EXTRACTION  2006, 2008   gluacoma  . EXPLORATORY LAPAROTOMY  1996   incarcerated hernia  . INTRAMEDULLARY (IM) NAIL INTERTROCHANTERIC Right 05/10/2018   Procedure: INTRAMEDULLARY (IM) NAIL INTERTROCHANTRIC;  Surgeon: [name], MD;  Location: [facility];  Service: Orthopedics;  Laterality: Right;  . PARTIAL COLECTOMY  1991  . TOTAL KNEE ARTHROPLASTY Left 2010    There were no vitals filed for this visit.  Subjective Assessment - 11/20/18 1505    Subjective  "little pain in the hip and knee"    Currently in Pain?  Yes    Pain Score  2     Pain Location  Hip                       [facility] Adult PT Treatment/Exercise - 11/20/18 0001      High Level Balance   High Level Balance Activities  Side stepping   over rolls w/ 3#, both directions   High Level Balance Comments  step up on airex over form roll each side, forward stepping over roll w/ 3#      Knee/Hip Exercises: Aerobic   Nustep  L5 x 6 min       Knee/Hip Exercises: Machines for Strengthening   Cybex Knee  Extension  10# 2 sets 12    Cybex Knee Flexion  20# 2 sets 12               PT Short Term Goals - 11/06/18 1442      PT SHORT TERM GOAL #1   Title  decrease TUG time to 20 seconds    Status  Achieved        PT Long Term Goals - 11/20/18 1623      PT LONG TERM GOAL #1   Title  walk with SPC for all in house activities    Status  Partially Met      PT [place] #2   Title  increase right hip flexion in standing to 90 degrees    Status  Partially Met      PT LONG TERM GOAL #4   Title  able to return to grocery shopping    Status  Achieved      PT LONG TERM GOAL #5   Title  increase right hip strength to 4+/5  Status  Partially Met            Plan - 11/20/18 1628    Clinical Impression Statement  pt able to step over foam rolls while on airex on both Land R sides. pt needs cues to take wider steps while stepping over foam rolls. pt need rest breaks secondary to fatigue. pt able forward and side step over foam rolls w/ 3# weights. pt used cane for all standing exercises.    Stability/Clinical Decision Making  Evolving/Moderate complexity    Rehab Potential  Good    PT Frequency  2x / week    PT Duration  8 weeks    PT Treatment/Interventions  ADLs/Self Care Home Management;Electrical Stimulation;Functional mobility training;Stair training;Gait training;Therapeutic activities;Therapeutic exercise;Balance training;Neuromuscular re-education;Manual techniques;Patient/family education    PT Next Visit Plan  continue to work on LE strength and gait with [facility]       Patient will benefit from skilled therapeutic intervention in order to improve the following deficits and impairments:  Abnormal gait, Cardiopulmonary status limiting activity, Decreased mobility, Decreased activity tolerance, Decreased endurance, Decreased range of motion, Decreased strength, Decreased balance, Difficulty walking, Pain  Visit Diagnosis: Muscle weakness (generalized)     Problem  List Patient Active Problem List   Diagnosis Date Noted  . Slow transit constipation   . Labile blood pressure   . Postoperative pain   . Displaced fracture of right femoral neck ([place]) 05/14/2018  . Essential hypertension   . Hypothyroidism   . Acute blood loss anemia 05/13/2018  . Obesity, Class III, BMI 40-49.9 (morbid obesity) ([place]) 05/13/2018  . Hip fracture ([place]) 05/10/2018  . Colon cancer ([place]) 02/27/2013  . Essential hypertension, benign 02/27/2013  . Glaucoma 02/27/2013  . Ulcerative colitis ([place]) 02/27/2013  . Diabetes [facility])     Barrett Henle, [place] 11/20/2018, 4:31 PM  [place], [place], [address] Phone: [phone]   Fax:  [phone]  Name: Jenna Chambers MRN: [id number] Date of Birth: October 23, 1931

## 2018-11-24 DIAGNOSIS — M1711 Unilateral primary osteoarthritis, right knee: Secondary | ICD-10-CM | POA: Diagnosis not present

## 2018-11-24 DIAGNOSIS — S72001D Fracture of unspecified part of neck of right femur, subsequent encounter for closed fracture with routine healing: Secondary | ICD-10-CM | POA: Diagnosis not present

## 2018-11-25 ENCOUNTER — Other Ambulatory Visit: Payer: Self-pay

## 2018-11-25 ENCOUNTER — Ambulatory Visit: Payer: Medicare Other | Admitting: Physical Therapy

## 2018-11-25 DIAGNOSIS — R52 Pain, unspecified: Secondary | ICD-10-CM | POA: Diagnosis not present

## 2018-11-25 DIAGNOSIS — R262 Difficulty in walking, not elsewhere classified: Secondary | ICD-10-CM | POA: Diagnosis not present

## 2018-11-25 DIAGNOSIS — M25551 Pain in right hip: Secondary | ICD-10-CM

## 2018-11-25 DIAGNOSIS — M6281 Muscle weakness (generalized): Secondary | ICD-10-CM | POA: Diagnosis not present

## 2018-11-25 NOTE — Therapy (Signed)
[address] [place], [place], [address] Phone: [phone]   Fax:  [phone]  Physical Therapy Treatment  Patient Details  Name: Jenna Chambers MRN: [id number] Date of Birth: 04/19/1931 Referring Provider (PT): Victorino December   Encounter Date: 11/25/2018  PT End of Session - 11/25/18 1559    Visit Number  13    Date for PT Re-Evaluation  12/08/18    PT Start Time  1530    PT Stop Time  1620    PT Time Calculation (min)  50 min       Past Medical History:  Diagnosis Date  . Colon cancer ([place])    resecetion  . Diabetes ([place])    Type 2  . Glaucoma    --both eyes per patient getting worse  . Gout   . Hypertension   . Osteopenia   . Sleep apnea    no CPAP  . Ulcerative colitis [facility])     Past Surgical History:  Procedure Laterality Date  . BILATERAL SALPINGOOPHORECTOMY  1993   serous cystadenoma  . CATARACT EXTRACTION  2006, 2008   gluacoma  . EXPLORATORY LAPAROTOMY  1996   incarcerated hernia  . INTRAMEDULLARY (IM) NAIL INTERTROCHANTERIC Right 05/10/2018   Procedure: INTRAMEDULLARY (IM) NAIL INTERTROCHANTRIC;  Surgeon: [name], MD;  Location: [facility];  Service: Orthopedics;  Laterality: Right;  . PARTIAL COLECTOMY  1991  . TOTAL KNEE ARTHROPLASTY Left 2010    There were no vitals filed for this visit.  Subjective Assessment - 11/25/18 1532    Subjective  cortisone shot in RT knee yesterday that helped I think    Currently in Pain?  Yes    Pain Score  2     Pain Location  Hip    Pain Orientation  Right                       [facility] Adult PT Treatment/Exercise - 11/25/18 0001      Knee/Hip Exercises: Machines for Strengthening   Cybex Knee Extension  10# 3 sets 10    Cybex Knee Flexion  25# 2 sets 10    Cybex Leg Press  30# 2 sets 10, RT LE 20# 10 reps   calf raises 30# 2 sets 15     Knee/Hip Exercises: Standing   Hip Abduction  Stengthening;Both;2 sets;10  reps;Knee straight    Other Standing Knee Exercises  stepping over sm foam roll 10 x then over larger black foam roll with BIL UE assistance    Other Standing Knee Exercises  standing on airex. red tband RT hip flex,ext and abd 10 each   unable to do on RT as it causes increased pain /weak SLS RT     Knee/Hip Exercises: Seated   Ball Squeeze  20    Clamshell with TheraBand  Blue    Other Seated Knee/Hip Exercises  hip flex with blue tband 20    Sit to Sand  20 reps;without UE support   10 reps holding wt ball, 10 reps ball toss              PT Short Term Goals - 11/06/18 1442      PT SHORT TERM GOAL #1   Title  decrease TUG time to 20 seconds    Status  Achieved        PT Long Term Goals - 11/25/18 1553  PT LONG TERM GOAL #1   Title  walk with SPC for all in house activities    Status  Partially Met      PT [place] #2   Title  increase right hip flexion in standing to 90 degrees    Status  Partially Met      PT LONG TERM GOAL #3   Title  drive without any worries    Status  Achieved      PT LONG TERM GOAL #4   Title  able to return to grocery shopping    Status  Achieved      PT LONG TERM GOAL #5   Title  increase right hip strength to 4+/5    Baseline  met in sitting except for hip flexion 4/5.Marland Kitchen in wt bearing weakness noted esp with SLS moments on RT limiting confidence and safety with gait with SPC    Status  Partially Met            Plan - 11/25/18 1555    Clinical Impression Statement  pt progressing with goals. pt is making progress but still very limited with standing ex esp with SLS on RT making gait and activities with SPC difficult and unsafe. Pt has weakness when stadning on RT LE and pain in knee with audible crepitus.Did not push gait with SPC today as pt just had cortisone in RT knee yesterday.    PT Treatment/Interventions  ADLs/Self Care Home Management;Electrical Stimulation;Functional mobility training;Stair training;Gait  training;Therapeutic activities;Therapeutic exercise;Balance training;Neuromuscular re-education;Manual techniques;Patient/family education    PT Next Visit Plan  continue to work on LE strength and gait with [facility]       Patient will benefit from skilled therapeutic intervention in order to improve the following deficits and impairments:  Abnormal gait, Cardiopulmonary status limiting activity, Decreased mobility, Decreased activity tolerance, Decreased endurance, Decreased range of motion, Decreased strength, Decreased balance, Difficulty walking, Pain  Visit Diagnosis: Muscle weakness (generalized)  Difficulty in walking, not elsewhere classified  Pain in right hip     Problem List Patient Active Problem List   Diagnosis Date Noted  . Slow transit constipation   . Labile blood pressure   . Postoperative pain   . Displaced fracture of right femoral neck ([place]) 05/14/2018  . Essential hypertension   . Hypothyroidism   . Acute blood loss anemia 05/13/2018  . Obesity, Class III, BMI 40-49.9 (morbid obesity) ([place]) 05/13/2018  . Hip fracture ([place]) 05/10/2018  . Colon cancer ([place]) 02/27/2013  . Essential hypertension, benign 02/27/2013  . Glaucoma 02/27/2013  . Ulcerative colitis ([place]) 02/27/2013  . Diabetes ([place])     Druanne Bosques,ANGIE PTA 11/25/2018, 4:05 PM  [place], [place], [address] Phone: [phone]   Fax:  [phone]  Name: Jenna Chambers MRN: [id number] Date of Birth: [birth year]

## 2018-11-27 ENCOUNTER — Ambulatory Visit: Payer: Medicare Other | Admitting: Physical Therapy

## 2018-11-29 DIAGNOSIS — S72001A Fracture of unspecified part of neck of right femur, initial encounter for closed fracture: Secondary | ICD-10-CM | POA: Diagnosis not present

## 2018-12-02 ENCOUNTER — Ambulatory Visit: Payer: Medicare Other | Admitting: Physical Therapy

## 2018-12-02 ENCOUNTER — Other Ambulatory Visit: Payer: Self-pay

## 2018-12-02 ENCOUNTER — Encounter: Payer: Self-pay | Admitting: Physical Therapy

## 2018-12-02 DIAGNOSIS — M25551 Pain in right hip: Secondary | ICD-10-CM

## 2018-12-02 DIAGNOSIS — M6281 Muscle weakness (generalized): Secondary | ICD-10-CM | POA: Diagnosis not present

## 2018-12-02 DIAGNOSIS — R52 Pain, unspecified: Secondary | ICD-10-CM

## 2018-12-02 DIAGNOSIS — R262 Difficulty in walking, not elsewhere classified: Secondary | ICD-10-CM

## 2018-12-02 NOTE — Therapy (Signed)
[place] [place], [place], [address] Phone: [phone]   Fax:  [phone]  Physical Therapy Treatment  Patient Details  Name: Jenna Chambers MRN: [id number] Date of Birth: 31-May-1931 Referring Provider (PT): Victorino December   Encounter Date: 12/02/2018  PT End of Session - 12/02/18 1619    Visit Number  14    Date for PT Re-Evaluation  12/08/18    PT Start Time  4315    PT Stop Time  1600    PT Time Calculation (min)  45 min    Activity Tolerance  Patient tolerated treatment well    Behavior During Therapy  [facility] for tasks assessed/performed       Past Medical History:  Diagnosis Date  . Colon cancer ([place])    resecetion  . Diabetes ([place])    Type 2  . Glaucoma    --both eyes per patient getting worse  . Gout   . Hypertension   . Osteopenia   . Sleep apnea    no CPAP  . Ulcerative colitis [facility])     Past Surgical History:  Procedure Laterality Date  . BILATERAL SALPINGOOPHORECTOMY  1993   serous cystadenoma  . CATARACT EXTRACTION  2006, 2008   gluacoma  . EXPLORATORY LAPAROTOMY  1996   incarcerated hernia  . INTRAMEDULLARY (IM) NAIL INTERTROCHANTERIC Right 05/10/2018   Procedure: INTRAMEDULLARY (IM) NAIL INTERTROCHANTRIC;  Surgeon: [name], MD;  Location: [place];  Service: Orthopedics;  Laterality: Right;  . PARTIAL COLECTOMY  1991  . TOTAL KNEE ARTHROPLASTY Left 2010    There were no vitals filed for this visit.  Subjective Assessment - 12/02/18 1517    Subjective  "Pretty good"    How long can you walk comfortably?  150 feet and out of breath    Pain Score  3     Pain Location  Hip    Pain Orientation  Right                       [facility] Adult PT Treatment/Exercise - 12/02/18 0001      Ambulation/Gait   Ambulation/Gait Assistance  6: Modified independent (Device/Increase time);5: Supervision;4: Min guard    Ambulation Distance (Feet)  200 Feet     Assistive device  Straight cane    Gait Pattern  Step-to pattern    Ambulation Surface  Level;Indoor    Gait Comments  broken into 3 trials, decrease stance time on RLE      Knee/Hip Exercises: Aerobic   Recumbent Bike  L0 x 4 min     Nustep  L5 x 6 min       Knee/Hip Exercises: Machines for Strengthening   Cybex Leg Press  30# 2 sets 10, RT LE 20# 10 reps      Knee/Hip Exercises: Standing   Hip Abduction  Stengthening;Both;2 sets;10 reps;Knee straight    Abduction Limitations  3lb    Forward Step Up  Right;1 set;5 reps;Step Height: 4";Hand Hold: 2   some pull from UE   Other Standing Knee Exercises  Standing hip flex 3lb x 10 each                PT Short Term Goals - 11/06/18 1442      PT SHORT TERM GOAL #1   Title  decrease TUG time to 20 seconds    Status  Achieved  PT Long Term Goals - 11/25/18 1553      PT LONG TERM GOAL #1   Title  walk with SPC for all in house activities    Status  Partially Met      PT LONG TERM GOAL #2   Title  increase right hip flexion in standing to 90 degrees    Status  Partially Met      PT LONG TERM GOAL #3   Title  drive without any worries    Status  Achieved      PT LONG TERM GOAL #4   Title  able to return to grocery shopping    Status  Achieved      PT LONG TERM GOAL #5   Title  increase right hip strength to 4+/5    Baseline  met in sitting except for hip flexion 4/5.Marland Kitchen in wt bearing weakness noted esp with SLS moments on RT limiting confidence and safety with gait with SPC    Status  Partially Met            Plan - 12/02/18 1619    Clinical Impression Statement  Pt tolerated gait fair. She continues to have trouble standing on her RLE despite no reports of pain. She became very fatigue after each activity needing frequent rest. She had difficulty with SLS on her RLE, she reports that she does not trust it. Attempted some standing hip exercises, she did better tolerating SLS on RLE with firm pressure from  therapist applied to R hip.    Rehab Potential  Good    PT Frequency  2x / week    PT Duration  8 weeks    PT Treatment/Interventions  ADLs/Self Care Home Management;Electrical Stimulation;Functional mobility training;Stair training;Gait training;Therapeutic activities;Therapeutic exercise;Balance training;Neuromuscular re-education;Manual techniques;Patient/family education    PT Next Visit Plan  continue to work on LE strength and gait with [facility]       Patient will benefit from skilled therapeutic intervention in order to improve the following deficits and impairments:  Abnormal gait, Cardiopulmonary status limiting activity, Decreased mobility, Decreased activity tolerance, Decreased endurance, Decreased range of motion, Decreased strength, Decreased balance, Difficulty walking, Pain  Visit Diagnosis: Muscle weakness (generalized)  Difficulty in walking, not elsewhere classified  Pain in right hip  Pain     Problem List Patient Active Problem List   Diagnosis Date Noted  . Slow transit constipation   . Labile blood pressure   . Postoperative pain   . Displaced fracture of right femoral neck ([place]) 05/14/2018  . Essential hypertension   . Hypothyroidism   . Acute blood loss anemia 05/13/2018  . Obesity, Class III, BMI 40-49.9 (morbid obesity) ([place]) 05/13/2018  . Hip fracture ([place]) 05/10/2018  . Colon cancer ([place]) 02/27/2013  . Essential hypertension, benign 02/27/2013  . Glaucoma 02/27/2013  . Ulcerative colitis ([place]) 02/27/2013  . Diabetes ([place])     Scot Jun, PTA 12/02/2018, 4:26 PM  [place], [place], [address] Phone: [phone]   Fax:  [phone]  Name: Jenna Chambers MRN: [id number] Date of Birth: 09-10-1931

## 2018-12-04 ENCOUNTER — Ambulatory Visit: Payer: Medicare Other | Admitting: Physical Therapy

## 2018-12-04 ENCOUNTER — Other Ambulatory Visit: Payer: Self-pay

## 2018-12-04 DIAGNOSIS — M25551 Pain in right hip: Secondary | ICD-10-CM | POA: Diagnosis not present

## 2018-12-04 DIAGNOSIS — M6281 Muscle weakness (generalized): Secondary | ICD-10-CM | POA: Diagnosis not present

## 2018-12-04 DIAGNOSIS — R262 Difficulty in walking, not elsewhere classified: Secondary | ICD-10-CM

## 2018-12-04 DIAGNOSIS — R52 Pain, unspecified: Secondary | ICD-10-CM | POA: Diagnosis not present

## 2018-12-04 NOTE — Therapy (Signed)
[place] [address] [place], [place], [address] Phone: [phone]   Fax:  [phone]  Physical Therapy Treatment  Patient Details  Name: Jenna Chambers MRN: [id number] Date of Birth: February 23, 1931 Referring Provider (PT): Victorino December   Encounter Date: 12/04/2018  PT End of Session - 12/04/18 1528    Visit Number  15    PT Start Time  9937    PT Stop Time  1696    PT Time Calculation (min)  43 min       Past Medical History:  Diagnosis Date  . Colon cancer ([place])    resecetion  . Diabetes ([place])    Type 2  . Glaucoma    --both eyes per patient getting worse  . Gout   . Hypertension   . Osteopenia   . Sleep apnea    no CPAP  . Ulcerative colitis [facility])     Past Surgical History:  Procedure Laterality Date  . BILATERAL SALPINGOOPHORECTOMY  1993   serous cystadenoma  . CATARACT EXTRACTION  2006, 2008   gluacoma  . EXPLORATORY LAPAROTOMY  1996   incarcerated hernia  . INTRAMEDULLARY (IM) NAIL INTERTROCHANTERIC Right 05/10/2018   Procedure: INTRAMEDULLARY (IM) NAIL INTERTROCHANTRIC;  Surgeon: [name], MD;  Location: [place];  Service: Orthopedics;  Laterality: Right;  . PARTIAL COLECTOMY  1991  . TOTAL KNEE ARTHROPLASTY Left 2010    There were no vitals filed for this visit.  Subjective Assessment - 12/04/18 1452    Subjective  "doing pretty good"    Currently in Pain?  Yes    Pain Score  3     Pain Location  Knee    Pain Orientation  Right                       [facility] Adult PT Treatment/Exercise - 12/04/18 0001      Knee/Hip Exercises: Aerobic   Recumbent Bike  L0 x 4 min     Nustep  L5 x 6 min       Knee/Hip Exercises: Standing   Walking with Sports Cord  resisted gait w/ blue tband, forward/side stepping    Other Standing Knee Exercises  standing on airex. red tband RT hip flex,ext and abd 10 each               PT Short Term Goals - 11/06/18 1442       PT SHORT TERM GOAL #1   Title  decrease TUG time to 20 seconds    Status  Achieved        PT Long Term Goals - 12/04/18 1453      PT LONG TERM GOAL #1   Title  walk with SPC for all in house activities    Status  Partially Met      PT [place] #2   Title  increase right hip flexion in standing to 90 degrees    Status  Partially Met      PT LONG TERM GOAL #5   Title  increase right hip strength to 4+/5    Status  Partially Met            Plan - 12/04/18 1530    Clinical Impression Statement  pt able to perform resisted gait with a blue tband with min guarding. pt has difficulty side stepping with the right leg leading. pt able to perform marching, hip flex/abduction  on airex with red tband. pt needs min guarding to maintain  balance. pt needs rest breaks secondary to fatigue.    Stability/Clinical Decision Making  Evolving/Moderate complexity    Rehab Potential  Good    PT Frequency  2x / week    PT Duration  8 weeks    PT Treatment/Interventions  ADLs/Self Care Home Management;Electrical Stimulation;Functional mobility training;Stair training;Gait training;Therapeutic activities;Therapeutic exercise;Balance training;Neuromuscular re-education;Manual techniques;Patient/family education    PT Next Visit Plan  continue to work on LE strength and gait with [facility]       Patient will benefit from skilled therapeutic intervention in order to improve the following deficits and impairments:  Abnormal gait, Cardiopulmonary status limiting activity, Decreased mobility, Decreased activity tolerance, Decreased endurance, Decreased range of motion, Decreased strength, Decreased balance, Difficulty walking, Pain  Visit Diagnosis: Muscle weakness (generalized)  Difficulty in walking, not elsewhere classified     Problem List Patient Active Problem List   Diagnosis Date Noted  . Slow transit constipation   . Labile blood pressure   . Postoperative pain   . Displaced  fracture of right femoral neck ([place]) 05/14/2018  . Essential hypertension   . Hypothyroidism   . Acute blood loss anemia 05/13/2018  . Obesity, Class III, BMI 40-49.9 (morbid obesity) ([place]) 05/13/2018  . Hip fracture ([place]) 05/10/2018  . Colon cancer ([place]) 02/27/2013  . Essential hypertension, benign 02/27/2013  . Glaucoma 02/27/2013  . Ulcerative colitis ([place]) 02/27/2013  . Diabetes [facility])     Barrett Henle, [place] 12/04/2018, 3:35 PM  [place], [place], [address] Phone: [phone]   Fax:  [phone]  Name: Jenna Chambers MRN: [id number] Date of Birth: Dec 16, 1931

## 2018-12-09 ENCOUNTER — Ambulatory Visit: Payer: Medicare Other | Admitting: Physical Therapy

## 2018-12-09 ENCOUNTER — Other Ambulatory Visit: Payer: Self-pay

## 2018-12-09 DIAGNOSIS — M6281 Muscle weakness (generalized): Secondary | ICD-10-CM

## 2018-12-09 DIAGNOSIS — R262 Difficulty in walking, not elsewhere classified: Secondary | ICD-10-CM | POA: Diagnosis not present

## 2018-12-09 DIAGNOSIS — R52 Pain, unspecified: Secondary | ICD-10-CM | POA: Diagnosis not present

## 2018-12-09 DIAGNOSIS — M25551 Pain in right hip: Secondary | ICD-10-CM | POA: Diagnosis not present

## 2018-12-09 NOTE — Therapy (Signed)
[place] [address] [place], [place], [address] Phone: [phone]   Fax:  [phone]  Physical Therapy Treatment  Patient Details  Name: Jenna Chambers MRN: [id number] Date of Birth: July 06, 1931 Referring Provider (PT): Victorino December   Encounter Date: 12/09/2018  PT End of Session - 12/09/18 1436    Visit Number  16    PT Start Time  1400    PT Stop Time  1440    PT Time Calculation (min)  40 min       Past Medical History:  Diagnosis Date  . Colon cancer ([place])    resecetion  . Diabetes ([place])    Type 2  . Glaucoma    --both eyes per patient getting worse  . Gout   . Hypertension   . Osteopenia   . Sleep apnea    no CPAP  . Ulcerative colitis [facility])     Past Surgical History:  Procedure Laterality Date  . BILATERAL SALPINGOOPHORECTOMY  1993   serous cystadenoma  . CATARACT EXTRACTION  2006, 2008   gluacoma  . EXPLORATORY LAPAROTOMY  1996   incarcerated hernia  . INTRAMEDULLARY (IM) NAIL INTERTROCHANTERIC Right 05/10/2018   Procedure: INTRAMEDULLARY (IM) NAIL INTERTROCHANTRIC;  Surgeon: [name], MD;  Location: [place];  Service: Orthopedics;  Laterality: Right;  . PARTIAL COLECTOMY  1991  . TOTAL KNEE ARTHROPLASTY Left 2010    There were no vitals filed for this visit.  Subjective Assessment - 12/09/18 1403    Subjective  "doing okay, a little pain in the right hip"    Currently in Pain?  Yes    Pain Score  2     Pain Location  Hip    Pain Orientation  Right                       [facility] Adult PT Treatment/Exercise - 12/09/18 0001      Ambulation/Gait   Ambulation/Gait  Yes    Ambulation/Gait Assistance  6: Modified independent (Device/Increase time);5: Supervision;4: Min guard    Ambulation Distance (Feet)  100 Feet    Assistive device  Straight cane      Knee/Hip Exercises: Aerobic   Recumbent Bike  L0 x 6 min     Nustep  L5 x 6 min       Knee/Hip  Exercises: Standing   Hip Abduction  Stengthening;Both;2 sets;Knee straight;20 reps   w/ 3# on airex   Hip Extension  Stengthening;Both;2 sets;Knee straight;20 reps   w/ 3# on airex   Other Standing Knee Exercises  marching on airex with 3#  2 x 10               PT Short Term Goals - 11/06/18 1442      PT SHORT TERM GOAL #1   Title  decrease TUG time to 20 seconds    Status  Achieved        PT Long Term Goals - 12/09/18 1404      PT LONG TERM GOAL #1   Title  walk with SPC for all in house activities    Status  Partially Met      PT LONG TERM GOAL #2   Title  increase right hip flexion in standing to 90 degrees    Status  Partially Met      PT LONG TERM GOAL #5   Title  increase right hip strength to  4+/5    Status  Partially Met            Plan - 12/09/18 1440    Clinical Impression Statement  pt able to walk with her cane. her right knee has a tendency to buckle and it causes her to be fearful. pt requires rest breaks secondary to fatigue and SOB. pt able to perform standing hip exercises with weight on airex. pt has difficulty lifting left hip into abduction. pt will be renewed for another month.    Stability/Clinical Decision Making  Evolving/Moderate complexity    Rehab Potential  Good    PT Frequency  2x / week    PT Duration  8 weeks    PT Treatment/Interventions  ADLs/Self Care Home Management;Electrical Stimulation;Functional mobility training;Stair training;Gait training;Therapeutic activities;Therapeutic exercise;Balance training;Neuromuscular re-education;Manual techniques;Patient/family education    PT Next Visit Plan  continue to work on LE strength and gait with SPC. pt be renewed for another month.       Patient will benefit from skilled therapeutic intervention in order to improve the following deficits and impairments:  Abnormal gait, Cardiopulmonary status limiting activity, Decreased mobility, Decreased activity tolerance, Decreased  endurance, Decreased range of motion, Decreased strength, Decreased balance, Difficulty walking, Pain  Visit Diagnosis: Muscle weakness (generalized)  Difficulty in walking, not elsewhere classified     Problem List Patient Active Problem List   Diagnosis Date Noted  . Slow transit constipation   . Labile blood pressure   . Postoperative pain   . Displaced fracture of right femoral neck ([place]) 05/14/2018  . Essential hypertension   . Hypothyroidism   . Acute blood loss anemia 05/13/2018  . Obesity, Class III, BMI 40-49.9 (morbid obesity) ([place]) 05/13/2018  . Hip fracture ([place]) 05/10/2018  . Colon cancer ([place]) 02/27/2013  . Essential hypertension, benign 02/27/2013  . Glaucoma 02/27/2013  . Ulcerative colitis ([place]) 02/27/2013  . Diabetes [facility])     Barrett Henle, [place] 12/09/2018, 2:46 PM  [place], [place], [address] Phone: [phone]   Fax:  [phone]  Name: Jenna Chambers MRN: [id number] Date of Birth: 02/05/1931

## 2018-12-10 DIAGNOSIS — H401111 Primary open-angle glaucoma, right eye, mild stage: Secondary | ICD-10-CM | POA: Diagnosis not present

## 2018-12-10 DIAGNOSIS — H1789 Other corneal scars and opacities: Secondary | ICD-10-CM | POA: Diagnosis not present

## 2018-12-16 ENCOUNTER — Other Ambulatory Visit: Payer: Self-pay

## 2018-12-16 ENCOUNTER — Encounter: Payer: Self-pay | Admitting: Physical Therapy

## 2018-12-16 ENCOUNTER — Ambulatory Visit: Payer: Medicare Other | Attending: Orthopedic Surgery | Admitting: Physical Therapy

## 2018-12-16 DIAGNOSIS — R262 Difficulty in walking, not elsewhere classified: Secondary | ICD-10-CM | POA: Insufficient documentation

## 2018-12-16 DIAGNOSIS — M25551 Pain in right hip: Secondary | ICD-10-CM | POA: Insufficient documentation

## 2018-12-16 DIAGNOSIS — M6281 Muscle weakness (generalized): Secondary | ICD-10-CM | POA: Insufficient documentation

## 2018-12-16 NOTE — Patient Instructions (Signed)
Access Code: [email or web address]  URL: [email or web address]  Date: 12/16/2018  Prepared by: Lum Babe   Exercises Supine Hip Adduction Isometric with Ball - 10 reps - 1 sets - 2 hold - 2x daily - 7x weekly Supine Hip Adductor Stretch - 5 reps - 1 sets - 20 hold - 2x daily - 7x weekly Seated Hamstring Stretch with Chair - 5 reps - 1 sets - 20 hold - 2x daily - 7x weekly Modified Thomas Stretch - 10 reps - 1 sets - 20 hold - 2x daily - 7x weekly Seated Gastroc Stretch with Strap - 5 reps - 1 sets - 30 hold - 2x daily - 7x weekly

## 2018-12-16 NOTE — Therapy (Signed)
[place], [place], [address] Phone: [phone]   Fax:  [phone]  Physical Therapy Treatment  Patient Details  Name: Jenna Chambers MRN: [id number] Date of Birth: 11-01-1931 Referring Provider (PT): Victorino December   Encounter Date: 12/16/2018  PT End of Session - 12/16/18 1541    Visit Number  17    Date for PT Re-Evaluation  01/07/19    PT Start Time  1400    PT Stop Time  1445    PT Time Calculation (min)  45 min    Activity Tolerance  Patient tolerated treatment well    Behavior During Therapy  [facility] for tasks assessed/performed       Past Medical History:  Diagnosis Date  . Colon cancer ([place])    resecetion  . Diabetes ([place])    Type 2  . Glaucoma    --both eyes per patient getting worse  . Gout   . Hypertension   . Osteopenia   . Sleep apnea    no CPAP  . Ulcerative colitis [facility])     Past Surgical History:  Procedure Laterality Date  . BILATERAL SALPINGOOPHORECTOMY  1993   serous cystadenoma  . CATARACT EXTRACTION  2006, 2008   gluacoma  . EXPLORATORY LAPAROTOMY  1996   incarcerated hernia  . INTRAMEDULLARY (IM) NAIL INTERTROCHANTERIC Right 05/10/2018   Procedure: INTRAMEDULLARY (IM) NAIL INTERTROCHANTRIC;  Surgeon: [name], MD;  Location: [place];  Service: Orthopedics;  Laterality: Right;  . PARTIAL COLECTOMY  1991  . TOTAL KNEE ARTHROPLASTY Left 2010    There were no vitals filed for this visit.  Subjective Assessment - 12/16/18 1453    Subjective  I really am doing okay still discouraged about my right knee    Currently in Pain?  Yes    Pain Score  4     Pain Location  Knee    Pain Orientation  Right    Aggravating Factors   weight bearing and walking increase the right knee pain                       [facility] Adult PT Treatment/Exercise - 12/16/18 0001      Ambulation/Gait   Gait Comments  walked with patient with wallker and then with  [facility] wroking on step length sequencing and trust of the right knee and leg      Knee/Hip Exercises: Stretches   Passive Hamstring Stretch  Right;4 reps;20 seconds    Gastroc Stretch  Both;4 reps;20 seconds    Other Knee/Hip Stretches  adductor stretch, hip flexor and quad stretch, had to be very gentle iwth this as she was very tight and painful      Knee/Hip Exercises: Aerobic   Nustep  L5 x 6 min       Knee/Hip Exercises: Supine   Bridges with [employer]  Both;2 sets;10 reps;Limitations   this caused some back pain had to stop   Other Supine Knee/Hip Exercises  feet on ball K2C, trunk rotation, very small bridges working to stop the back pain but strengthen the back and hips., isometric abs      Knee/Hip Exercises: Sidelying   Clams  20 reps bilaterally with 5# at the knee               PT Short Term Goals - 11/06/18 1442      PT SHORT TERM GOAL #1  Title  decrease TUG time to 20 seconds    Status  Achieved        PT Long Term Goals - 12/16/18 1544      PT LONG TERM GOAL #1   Title  walk with [facility] for all in house activities    Status  Partially Met            Plan - 12/16/18 1541    Clinical Impression Statement  Patient reports that she really is still having right knee pain and difficulty walking.  I tried to do some assessment of the knee and the hip, she is tight in the HS, the gastroc, hip flexor and quad and some adductor tightness.  I re did the HEP and added the stretches to see if this could help her pain, I think that the tightness coulkd be the issue but she still has significant right knee pain with weight bearing and feel that she may need to see her MD about this    PT Next Visit Plan  work on flexibility and her gait    Consulted and Agree with Plan of Care  Patient       Patient will benefit from skilled therapeutic intervention in order to improve the following deficits and impairments:  Abnormal gait, Cardiopulmonary status limiting  activity, Decreased mobility, Decreased activity tolerance, Decreased endurance, Decreased range of motion, Decreased strength, Decreased balance, Difficulty walking, Pain  Visit Diagnosis: Muscle weakness (generalized)  Difficulty in walking, not elsewhere classified  Pain in right hip     Problem List Patient Active Problem List   Diagnosis Date Noted  . Slow transit constipation   . Labile blood pressure   . Postoperative pain   . Displaced fracture of right femoral neck ([place]) 05/14/2018  . Essential hypertension   . Hypothyroidism   . Acute blood loss anemia 05/13/2018  . Obesity, Class III, BMI 40-49.9 (morbid obesity) ([place]) 05/13/2018  . Hip fracture ([place]) 05/10/2018  . Colon cancer ([place]) 02/27/2013  . Essential hypertension, benign 02/27/2013  . Glaucoma 02/27/2013  . Ulcerative colitis ([place]) 02/27/2013  . Diabetes [facility])     Jenna Chambers., PT 12/16/2018, 3:44 PM  [place], [place], [address] Phone: [phone]   Fax:  [phone]  Name: Jenna Chambers MRN: [id number] Date of Birth: 11-28-1931

## 2018-12-18 ENCOUNTER — Ambulatory Visit: Payer: Medicare Other | Admitting: Physical Therapy

## 2018-12-18 ENCOUNTER — Other Ambulatory Visit: Payer: Self-pay

## 2018-12-18 DIAGNOSIS — M6281 Muscle weakness (generalized): Secondary | ICD-10-CM | POA: Diagnosis not present

## 2018-12-18 DIAGNOSIS — M25551 Pain in right hip: Secondary | ICD-10-CM | POA: Diagnosis not present

## 2018-12-18 DIAGNOSIS — R262 Difficulty in walking, not elsewhere classified: Secondary | ICD-10-CM | POA: Diagnosis not present

## 2018-12-18 NOTE — Therapy (Signed)
[place], [place], [address] Phone: [phone]   Fax:  [phone]  Physical Therapy Treatment  Patient Details  Name: Jenna Chambers MRN: [id number] Date of Birth: [birth year] Referring Provider (PT): Victorino December   Encounter Date: 12/18/2018  PT End of Session - 12/18/18 1531    Visit Number  18    Date for PT Re-Evaluation  01/07/19    PT Start Time  6270    PT Stop Time  1530    PT Time Calculation (min)  45 min       Past Medical History:  Diagnosis Date  . Colon cancer ([place])    resecetion  . Diabetes ([place])    Type 2  . Glaucoma    --both eyes per patient getting worse  . Gout   . Hypertension   . Osteopenia   . Sleep apnea    no CPAP  . Ulcerative colitis [facility])     Past Surgical History:  Procedure Laterality Date  . BILATERAL SALPINGOOPHORECTOMY  1993   serous cystadenoma  . CATARACT EXTRACTION  2006, 2008   glu[place]  . EXPLORATORY LAPAROTOMY  1996   incarcerated hernia  . INTRAMEDULLARY (IM) NAIL INTERTROCHANTERIC Right 05/10/2018   Procedure: INTRAMEDULLARY (IM) NAIL INTERTROCHANTRIC;  Surgeon: [name], MD;  Location: [place];  Service: Orthopedics;  Laterality: Right;  . PARTIAL COLECTOMY  1991  . TOTAL KNEE ARTHROPLASTY Left 2010    There were no vitals filed for this visit.  Subjective Assessment - 12/18/18 1451    Subjective  I am doing okay.    Currently in Pain?  Yes    Pain Score  4     Pain Location  Knee    Pain Orientation  Right    Pain Type  Acute pain                       [facility] Adult PT Treatment/Exercise - 12/18/18 0001      Knee/Hip Exercises: Aerobic   Nustep  L5 x 6 min       Knee/Hip Exercises: Machines for Strengthening   Cybex Leg Press  30# 2 sets 10, 20# LLE, RLE 10 reps      Knee/Hip Exercises: Seated   Long Arc Quad  2 sets;10 reps;Both    Long Arc Quad Weight  3 lbs.    Marching  2 sets;Both;10  reps;Strengthening   2.5#   Hamstring Curl  Strengthening;Both;20 reps    Hamstring Limitations  green theraband    Abduction/Adduction   2 sets;15 reps    Abd/Adduction Limitations  green band      Knee/Hip Exercises: Supine   Straight Leg Raises  Strengthening;Both;20 reps    Straight Leg Raises Limitations  SLR with abduction 2 x 10                PT Short Term Goals - 11/06/18 1442      PT SHORT TERM GOAL #1   Title  decrease TUG time to 20 seconds    Status  Achieved        PT Long Term Goals - 12/18/18 1455      PT LONG TERM GOAL #1   Title  walk with SPC for all in house activities      PT LONG TERM GOAL #2   Title  increase right hip flexion in standing to 90 degrees  PT LONG TERM GOAL #5   Title  increase right hip strength to 4+/5    Status  Partially Met            Plan - 12/18/18 1532    Clinical Impression Statement  pt's exercises were scaled back today to focus on strengthening the knees and hips. pt attempted SLR with weight, but she was unable to lift leg. weights were removed and she was able to complete the exercise. pt able to add abduction with SLR. pt able to perform SL on leg press on each leg. pt is progressing toward hip strength goal. she is still weak in hip flexion on the left side.    Rehab Potential  Good    PT Frequency  2x / week    PT Duration  8 weeks    PT Treatment/Interventions  ADLs/Self Care Home Management;Electrical Stimulation;Functional mobility training;Stair training;Gait training;Therapeutic activities;Therapeutic exercise;Balance training;Neuromuscular re-education;Manual techniques;Patient/family education    PT Next Visit Plan  increase strength on knee and hip       Patient will benefit from skilled therapeutic intervention in order to improve the following deficits and impairments:  Abnormal gait, Cardiopulmonary status limiting activity, Decreased mobility, Decreased activity tolerance, Decreased  endurance, Decreased range of motion, Decreased strength, Decreased balance, Difficulty walking, Pain  Visit Diagnosis: Muscle weakness (generalized)     Problem List Patient Active Problem List   Diagnosis Date Noted  . Slow transit constipation   . Labile blood pressure   . Postoperative pain   . Displaced fracture of right femoral neck ([place]) 05/14/2018  . Essential hypertension   . Hypothyroidism   . Acute blood loss anemia 05/13/2018  . Obesity, Class III, BMI 40-49.9 (morbid obesity) ([place]) 05/13/2018  . Hip fracture ([place]) 05/10/2018  . Colon cancer ([place]) 02/27/2013  . Essential hypertension, benign 02/27/2013  . Glaucoma 02/27/2013  . Ulcerative colitis ([place]) 02/27/2013  . Diabetes [facility] [place])     Barrett Henle, [place] 12/18/2018, 3:43 PM  [place], [place], [address] Phone: [phone]   Fax:  [phone]  Name: Jenna Chambers MRN: [id number] Date of Birth: 1931/04/23

## 2018-12-23 ENCOUNTER — Ambulatory Visit: Payer: Medicare Other | Admitting: Physical Therapy

## 2018-12-23 ENCOUNTER — Other Ambulatory Visit: Payer: Self-pay

## 2018-12-23 DIAGNOSIS — M6281 Muscle weakness (generalized): Secondary | ICD-10-CM | POA: Diagnosis not present

## 2018-12-23 DIAGNOSIS — R262 Difficulty in walking, not elsewhere classified: Secondary | ICD-10-CM | POA: Diagnosis not present

## 2018-12-23 DIAGNOSIS — M25551 Pain in right hip: Secondary | ICD-10-CM | POA: Diagnosis not present

## 2018-12-23 NOTE — Therapy (Signed)
[place], [place], [address] Phone: [phone]   Fax:  [phone]  Physical Therapy Treatment  Patient Details  Name: Jenna Chambers MRN: [id number] Date of Birth: 07-29-1931 Referring Provider (PT): Victorino December   Encounter Date: 12/23/2018  PT End of Session - 12/23/18 1530    Visit Number  19    Date for PT Re-Evaluation  01/07/19    PT Start Time  1448    PT Stop Time  1530    PT Time Calculation (min)  42 min       Past Medical History:  Diagnosis Date  . Colon cancer ([place])    resecetion  . Diabetes ([place])    Type 2  . Glaucoma    --both eyes per patient getting worse  . Gout   . Hypertension   . Osteopenia   . Sleep apnea    no CPAP  . Ulcerative colitis [facility])     Past Surgical History:  Procedure Laterality Date  . BILATERAL SALPINGOOPHORECTOMY  1993   serous cystadenoma  . CATARACT EXTRACTION  2006, 2008   gluacoma  . EXPLORATORY LAPAROTOMY  1996   incarcerated hernia  . INTRAMEDULLARY (IM) NAIL INTERTROCHANTERIC Right 05/10/2018   Procedure: INTRAMEDULLARY (IM) NAIL INTERTROCHANTRIC;  Surgeon: [name], MD;  Location: [place];  Service: Orthopedics;  Laterality: Right;  . PARTIAL COLECTOMY  1991  . TOTAL KNEE ARTHROPLASTY Left 2010    There were no vitals filed for this visit.  Subjective Assessment - 12/23/18 1451    Subjective  "doing okay, dragging a little bit."    How long can you walk comfortably?  150 feet and out of breath    Currently in Pain?  Yes    Pain Score  4     Pain Location  Knee    Pain Orientation  Right                       [facility] Adult PT Treatment/Exercise - 12/23/18 0001      Knee/Hip Exercises: Aerobic   Recumbent Bike  L0 x 6 min     Nustep  L5 x 6 min       Knee/Hip Exercises: Machines for Strengthening   Cybex Knee Extension  10# 2 x 15    Cybex Knee Flexion  25# 2 sets 15      Knee/Hip Exercises:  Seated   Ball Squeeze  20    Marching  2 sets;Both;10 reps;Strengthening    Abduction/Adduction   2 sets;15 reps    Abd/Adduction Limitations  green band      Knee/Hip Exercises: Supine   Bridges  Strengthening;Both;20 reps    Other Supine Knee/Hip Exercises  K2C w/ ball 2 x 10               PT Short Term Goals - 11/06/18 1442      PT SHORT TERM GOAL #1   Title  decrease TUG time to 20 seconds    Status  Achieved        PT Long Term Goals - 12/18/18 1455      PT LONG TERM GOAL #1   Title  walk with SPC for all in house activities      PT LONG TERM GOAL #2   Title  increase right hip flexion in standing to 90 degrees      PT LONG TERM GOAL #  5   Title  increase right hip strength to 4+/5    Status  Partially Met            Plan - 12/23/18 1531    Clinical Impression Statement  pt tolerated treatment well as demonstrated by no increase of pain with interventions. pt continues to do hip/knee exercises in NWB position. pt able to perform bridges and K2C with ball. pt requires rest breaks secondary to fatigue and SOB. pt able to add weight to seated marches.pt able to add reps with knee flexion and extension.    Stability/Clinical Decision Making  Evolving/Moderate complexity    Rehab Potential  Good    PT Frequency  2x / week    PT Duration  8 weeks    PT Treatment/Interventions  ADLs/Self Care Home Management;Electrical Stimulation;Functional mobility training;Stair training;Gait training;Therapeutic activities;Therapeutic exercise;Balance training;Neuromuscular re-education;Manual techniques;Patient/family education    PT Next Visit Plan  increase strength on knee and hip       Patient will benefit from skilled therapeutic intervention in order to improve the following deficits and impairments:  Abnormal gait, Cardiopulmonary status limiting activity, Decreased mobility, Decreased activity tolerance, Decreased endurance, Decreased range of motion, Decreased  strength, Decreased balance, Difficulty walking, Pain  Visit Diagnosis: Muscle weakness (generalized)  Pain in right hip     Problem List Patient Active Problem List   Diagnosis Date Noted  . Slow transit constipation   . Labile blood pressure   . Postoperative pain   . Displaced fracture of right femoral neck ([place]) 05/14/2018  . Essential hypertension   . Hypothyroidism   . Acute blood loss anemia 05/13/2018  . Obesity, Class III, BMI 40-49.9 (morbid obesity) ([place]) 05/13/2018  . Hip fracture ([place]) 05/10/2018  . Colon cancer ([place]) 02/27/2013  . Essential hypertension, benign 02/27/2013  . Glaucoma 02/27/2013  . Ulcerative colitis ([place]) 02/27/2013  . Diabetes [facility])     Barrett Henle, [place] 12/23/2018, 3:43 PM  [place], [place], [address] Phone: [phone]   Fax:  [phone]  Name: Jenna Chambers MRN: [id number] Date of Birth: [birth year]

## 2018-12-25 ENCOUNTER — Ambulatory Visit: Payer: Medicare Other | Admitting: Physical Therapy

## 2018-12-25 ENCOUNTER — Other Ambulatory Visit: Payer: Self-pay

## 2018-12-25 DIAGNOSIS — M25551 Pain in right hip: Secondary | ICD-10-CM | POA: Diagnosis not present

## 2018-12-25 DIAGNOSIS — M6281 Muscle weakness (generalized): Secondary | ICD-10-CM | POA: Diagnosis not present

## 2018-12-25 DIAGNOSIS — R262 Difficulty in walking, not elsewhere classified: Secondary | ICD-10-CM | POA: Diagnosis not present

## 2018-12-25 NOTE — Therapy (Addendum)
[place] [address] [place], [place], [address] Phone: [phone]   Fax:  [phone] Progress Note Reporting Period 11/18/18 to 12/25/18 for visits 11-20  See note below for Objective Data and Assessment of Progress/Goals.      Physical Therapy Treatment  Patient Details  Name: Jenna Chambers MRN: [id number] Date of Birth: 1931/12/12 Referring Provider (PT): Victorino December   Encounter Date: 12/25/2018  PT End of Session - 12/25/18 1527    Visit Number  20    Date for PT Re-Evaluation  01/07/19    PT Start Time  1440    PT Stop Time  4827    PT Time Calculation (min)  47 min       Past Medical History:  Diagnosis Date  . Colon cancer ([place])    resecetion  . Diabetes ([place])    Type 2  . Glaucoma    --both eyes per patient getting worse  . Gout   . Hypertension   . Osteopenia   . Sleep apnea    no CPAP  . Ulcerative colitis [facility])     Past Surgical History:  Procedure Laterality Date  . BILATERAL SALPINGOOPHORECTOMY  1993   serous cystadenoma  . CATARACT EXTRACTION  2006, 2008   gluacoma  . EXPLORATORY LAPAROTOMY  1996   incarcerated hernia  . INTRAMEDULLARY (IM) NAIL INTERTROCHANTERIC Right 05/10/2018   Procedure: INTRAMEDULLARY (IM) NAIL INTERTROCHANTRIC;  Surgeon: [name], MD;  Location: [place];  Service: Orthopedics;  Laterality: Right;  . PARTIAL COLECTOMY  1991  . TOTAL KNEE ARTHROPLASTY Left 2010    There were no vitals filed for this visit.  Subjective Assessment - 12/25/18 1443    Subjective  "doing okay, i was sore after last session". pt states she thinks the bike is causing her hip pain.    Currently in Pain?  Yes    Pain Score  2     Pain Location  Knee    Pain Orientation  Right                       [facility] Adult PT Treatment/Exercise - 12/25/18 0001      Knee/Hip Exercises: Aerobic   Nustep  L5 x  8 min       Knee/Hip Exercises: Machines for  Strengthening   Cybex Knee Extension  15# 2 x 10    Cybex Knee Flexion  35# 2 sets 10    Cybex Leg Press  30# 2 sets 10      Knee/Hip Exercises: Seated   Other Seated Knee/Hip Exercises  flexion and extension on fitter, up and on box 2 x 10    Marching  2 sets;Both;10 reps;Strengthening   2.5#              PT Short Term Goals - 11/06/18 1442      PT SHORT TERM GOAL #1   Title  decrease TUG time to 20 seconds    Status  Achieved        PT Long Term Goals - 12/25/18 1445      PT LONG TERM GOAL #1   Title  walk with SPC for all in house activities    Status  Partially Met      PT LONG TERM GOAL #2   Title  increase right hip flexion in standing to 90 degrees    Status  Partially Met  PT LONG TERM GOAL #5   Title  increase right hip strength to 4+/5    Status  Partially Met            Plan - 12/25/18 1528    Clinical Impression Statement  pt did extra mintues on nustep since bike causes her hip pain. pt able to increase weight with knee flexion and extension. pt needs tactile cues with marching and seated atep overs to improve form and technique. pt performed knee flex/ext on fitter and added seated step overs.pt needs rest breaks secondary to fatigue and SOB.    Stability/Clinical Decision Making  Evolving/Moderate complexity    Rehab Potential  Good    PT Frequency  2x / week    PT Duration  8 weeks    PT Treatment/Interventions  ADLs/Self Care Home Management;Electrical Stimulation;Functional mobility training;Stair training;Gait training;Therapeutic activities;Therapeutic exercise;Balance training;Neuromuscular re-education;Manual techniques;Patient/family education    PT Next Visit Plan  increase strength on knee and hip    Consulted and Agree with Plan of Care  Patient       Patient will benefit from skilled therapeutic intervention in order to improve the following deficits and impairments:  Abnormal gait, Cardiopulmonary status limiting activity,  Decreased mobility, Decreased activity tolerance, Decreased endurance, Decreased range of motion, Decreased strength, Decreased balance, Difficulty walking, Pain  Visit Diagnosis: Muscle weakness (generalized)     Problem List Patient Active Problem List   Diagnosis Date Noted  . Slow transit constipation   . Labile blood pressure   . Postoperative pain   . Displaced fracture of right femoral neck ([place]) 05/14/2018  . Essential hypertension   . Hypothyroidism   . Acute blood loss anemia 05/13/2018  . Obesity, Class III, BMI 40-49.9 (morbid obesity) ([place]) 05/13/2018  . Hip fracture ([place]) 05/10/2018  . Colon cancer ([place]) 02/27/2013  . Essential hypertension, benign 02/27/2013  . Glaucoma 02/27/2013  . Ulcerative colitis ([place]) 02/27/2013  . Diabetes [facility])     Barrett Henle, [place] 12/25/2018, 3:36 PM  [place], [place], [address] Phone: [phone]   Fax:  [phone]  Name: Jenna Chambers MRN: [id number] Date of Birth: 01-28-1931

## 2018-12-29 DIAGNOSIS — S72001A Fracture of unspecified part of neck of right femur, initial encounter for closed fracture: Secondary | ICD-10-CM | POA: Diagnosis not present

## 2018-12-30 ENCOUNTER — Ambulatory Visit: Payer: Medicare Other | Admitting: Physical Therapy

## 2018-12-30 ENCOUNTER — Other Ambulatory Visit: Payer: Self-pay

## 2018-12-30 DIAGNOSIS — R262 Difficulty in walking, not elsewhere classified: Secondary | ICD-10-CM | POA: Diagnosis not present

## 2018-12-30 DIAGNOSIS — M6281 Muscle weakness (generalized): Secondary | ICD-10-CM

## 2018-12-30 DIAGNOSIS — M25551 Pain in right hip: Secondary | ICD-10-CM | POA: Diagnosis not present

## 2018-12-30 NOTE — Therapy (Signed)
[place], [place], [address] Phone: [phone]   Fax:  [phone]  Physical Therapy Treatment  Patient Details  Name: Jenna Chambers MRN: [id number] Date of Birth: Mar 27, 1931 Referring Provider (PT): Victorino December   Encounter Date: 12/30/2018  PT End of Session - 12/30/18 1455    Visit Number  21    Date for PT Re-Evaluation  01/07/19    PT Start Time  1409    PT Stop Time  1454    PT Time Calculation (min)  45 min       Past Medical History:  Diagnosis Date  . Colon cancer ([place])    resecetion  . Diabetes ([place])    Type 2  . Glaucoma    --both eyes per patient getting worse  . Gout   . Hypertension   . Osteopenia   . Sleep apnea    no CPAP  . Ulcerative colitis [facility])     Past Surgical History:  Procedure Laterality Date  . BILATERAL SALPINGOOPHORECTOMY  1993   serous cystadenoma  . CATARACT EXTRACTION  2006, 2008   gluacoma  . EXPLORATORY LAPAROTOMY  1996   incarcerated hernia  . INTRAMEDULLARY (IM) NAIL INTERTROCHANTERIC Right 05/10/2018   Procedure: INTRAMEDULLARY (IM) NAIL INTERTROCHANTRIC;  Surgeon: [name], MD;  Location: [place];  Service: Orthopedics;  Laterality: Right;  . PARTIAL COLECTOMY  1991  . TOTAL KNEE ARTHROPLASTY Left 2010    There were no vitals filed for this visit.  Subjective Assessment - 12/30/18 1411    Subjective  "doing okay, feeling pain in both hips and knees'. pt states she had a little soreness after last session.    Currently in Pain?  Yes    Pain Score  4     Pain Location  Knee    Pain Orientation  Right;Left                       [facility] Adult PT Treatment/Exercise - 12/30/18 0001      Knee/Hip Exercises: Aerobic   Nustep  L5 x  7 min       Knee/Hip Exercises: Machines for Strengthening   Cybex Knee Flexion  35# 2 sets 15    Cybex Leg Press  40# 2 sets 10      Knee/Hip Exercises: Seated   Long Arc Quad   2 sets;10 reps;Both    Long Arc Quad Weight  3 lbs.    Ball Squeeze  2 x 15    Clamshell with TheraBand  Green   2 x 15   Other Seated Knee/Hip Exercises  step on box 2 x 10, both    Marching  2 sets;Both;10 reps;Strengthening    Marching Weights  3 lbs.               PT Short Term Goals - 11/06/18 1442      PT SHORT TERM GOAL #1   Title  decrease TUG time to 20 seconds    Status  Achieved        PT Long Term Goals - 12/30/18 1445      PT LONG TERM GOAL #1   Title  walk with SPC for all in house activities    Status  Partially Met      PT LONG TERM GOAL #5   Title  increase right hip strength to 4+/5    Status  Achieved  Plan - 12/30/18 1457    Clinical Impression Statement  pt exercises were done in sitting due to pain in bilateral knees and hips. pt has met goal for R hip strength. pt increased weight with leg press. pt needs rest breaks secondary to fatigue and SOB. pt needs cues to improve form and technique to improve exercises.    Stability/Clinical Decision Making  Evolving/Moderate complexity    Rehab Potential  Good    PT Frequency  2x / week    PT Duration  8 weeks    PT Treatment/Interventions  ADLs/Self Care Home Management;Electrical Stimulation;Functional mobility training;Stair training;Gait training;Therapeutic activities;Therapeutic exercise;Balance training;Neuromuscular re-education;Manual techniques;Patient/family education    PT Next Visit Plan  increase strength on knee and hip, write doctor's note       Patient will benefit from skilled therapeutic intervention in order to improve the following deficits and impairments:  Abnormal gait, Cardiopulmonary status limiting activity, Decreased mobility, Decreased activity tolerance, Decreased endurance, Decreased range of motion, Decreased strength, Decreased balance, Difficulty walking, Pain  Visit Diagnosis: Muscle weakness (generalized)  Pain in right hip     Problem  List Patient Active Problem List   Diagnosis Date Noted  . Slow transit constipation   . Labile blood pressure   . Postoperative pain   . Displaced fracture of right femoral neck ([place]) 05/14/2018  . Essential hypertension   . Hypothyroidism   . Acute blood loss anemia 05/13/2018  . Obesity, Class III, BMI 40-49.9 (morbid obesity) ([place]) 05/13/2018  . Hip fracture ([place]) 05/10/2018  . Colon cancer ([place]) 02/27/2013  . Essential hypertension, benign 02/27/2013  . Glaucoma 02/27/2013  . Ulcerative colitis ([place]) 02/27/2013  . Diabetes [facility])     Barrett Henle, [place] 12/30/2018, 3:01 PM  [place], [place], [address] Phone: [phone]   Fax:  [phone]  Name: Jenna Chambers MRN: [id number] Date of Birth: 1931/02/28

## 2019-01-01 ENCOUNTER — Ambulatory Visit: Payer: Medicare Other | Admitting: Physical Therapy

## 2019-01-02 ENCOUNTER — Ambulatory Visit: Payer: Medicare Other | Admitting: Physical Therapy

## 2019-01-02 ENCOUNTER — Other Ambulatory Visit: Payer: Self-pay

## 2019-01-02 DIAGNOSIS — R262 Difficulty in walking, not elsewhere classified: Secondary | ICD-10-CM

## 2019-01-02 DIAGNOSIS — M6281 Muscle weakness (generalized): Secondary | ICD-10-CM

## 2019-01-02 DIAGNOSIS — M25551 Pain in right hip: Secondary | ICD-10-CM | POA: Diagnosis not present

## 2019-01-02 NOTE — Therapy (Signed)
[place], [place], [address] Phone: [phone]   Fax:  [phone]  Physical Therapy Treatment  Patient Details  Name: Jenna Chambers MRN: [id number] Date of Birth: 10-29-1931 Referring Provider (PT): Victorino December   Encounter Date: 01/02/2019  PT End of Session - 01/02/19 1140    Visit Number  22    Date for PT Re-Evaluation  01/07/19    PT Start Time  1102    PT Stop Time  1145    PT Time Calculation (min)  43 min       Past Medical History:  Diagnosis Date  . Colon cancer ([place])    resecetion  . Diabetes ([place])    Type 2  . Glaucoma    --both eyes per patient getting worse  . Gout   . Hypertension   . Osteopenia   . Sleep apnea    no CPAP  . Ulcerative colitis [facility])     Past Surgical History:  Procedure Laterality Date  . BILATERAL SALPINGOOPHORECTOMY  1993   serous cystadenoma  . CATARACT EXTRACTION  2006, 2008   gluacoma  . EXPLORATORY LAPAROTOMY  1996   incarcerated hernia  . INTRAMEDULLARY (IM) NAIL INTERTROCHANTERIC Right 05/10/2018   Procedure: INTRAMEDULLARY (IM) NAIL INTERTROCHANTRIC;  Surgeon: [name], MD;  Location: [facility] [place];  Service: Orthopedics;  Laterality: Right;  . PARTIAL COLECTOMY  1991  . TOTAL KNEE ARTHROPLASTY Left 2010    There were no vitals filed for this visit.  Subjective Assessment - 01/02/19 1104    Subjective  "feeling good"    Currently in Pain?  Yes    Pain Score  3     Pain Location  Leg    Pain Orientation  Right;Left                       [facility] Adult PT Treatment/Exercise - 01/02/19 0001      High Level Balance   High Level Balance Activities  Side stepping;Marching forwards   w/ HHA     Knee/Hip Exercises: Aerobic   Nustep  L6x  7 min       Knee/Hip Exercises: Machines for Strengthening   Cybex Knee Extension  15# 2 x 10    Cybex Knee Flexion  35# 2 sets 15      Knee/Hip Exercises: Seated   Long  Arc Quad  Strengthening;15 reps;2 sets;Weights    Long Arc Quad Weight  3 lbs.    Clamshell with TheraBand  Green   2 x 15   Marching  15 reps;2 sets;Strengthening;Both    Marching Weights  3 lbs.    Hamstring Limitations  green theraband               PT Short Term Goals - 11/06/18 1442      PT SHORT TERM GOAL #1   Title  decrease TUG time to 20 seconds    Status  Achieved        PT Long Term Goals - 01/02/19 1145      PT LONG TERM GOAL #2   Title  increase right hip flexion in standing to 90 degrees    Status  Partially Met            Plan - 01/02/19 1141    Clinical Impression Statement  pt able to perform forward marching and side stepping with HHA. pt has some instability in  the right knee with weight bearing. pt complains of pain in the knees with side stepping. pt needs rest breaks secondary to fatigue and SOB. pt able to increase weight with LAQ and HS curls. pt increased reps with marching.    Stability/Clinical Decision Making  Evolving/Moderate complexity    Rehab Potential  Good    PT Frequency  2x / week    PT Duration  8 weeks    PT Treatment/Interventions  ADLs/Self Care Home Management;Electrical Stimulation;Functional mobility training;Stair training;Gait training;Therapeutic activities;Therapeutic exercise;Balance training;Neuromuscular re-education;Manual techniques;Patient/family education    PT Next Visit Plan  increase strength on knee and hip    Consulted and Agree with Plan of Care  Patient       Patient will benefit from skilled therapeutic intervention in order to improve the following deficits and impairments:  Abnormal gait, Cardiopulmonary status limiting activity, Decreased mobility, Decreased activity tolerance, Decreased endurance, Decreased range of motion, Decreased strength, Decreased balance, Difficulty walking, Pain  Visit Diagnosis: Muscle weakness (generalized)  Difficulty in walking, not elsewhere  classified     Problem List Patient Active Problem List   Diagnosis Date Noted  . Slow transit constipation   . Labile blood pressure   . Postoperative pain   . Displaced fracture of right femoral neck ([place]) 05/14/2018  . Essential hypertension   . Hypothyroidism   . Acute blood loss anemia 05/13/2018  . Obesity, Class III, BMI 40-49.9 (morbid obesity) ([place]) 05/13/2018  . Hip fracture ([place]) 05/10/2018  . Colon cancer ([place]) 02/27/2013  . Essential hypertension, benign 02/27/2013  . Glaucoma 02/27/2013  . Ulcerative colitis ([place]) 02/27/2013  . Diabetes [facility])     Barrett Henle, [place] 01/02/2019, 11:48 AM  [place], [place], [address] Phone: [phone]   Fax:  [phone]  Name: Jenna Chambers MRN: [id number] Date of Birth: March 03, 1931

## 2019-01-05 DIAGNOSIS — M1711 Unilateral primary osteoarthritis, right knee: Secondary | ICD-10-CM | POA: Diagnosis not present

## 2019-01-06 ENCOUNTER — Other Ambulatory Visit: Payer: Self-pay

## 2019-01-06 ENCOUNTER — Ambulatory Visit: Payer: Medicare Other | Admitting: Physical Therapy

## 2019-01-06 DIAGNOSIS — R262 Difficulty in walking, not elsewhere classified: Secondary | ICD-10-CM | POA: Diagnosis not present

## 2019-01-06 DIAGNOSIS — M6281 Muscle weakness (generalized): Secondary | ICD-10-CM | POA: Diagnosis not present

## 2019-01-06 DIAGNOSIS — M25551 Pain in right hip: Secondary | ICD-10-CM | POA: Diagnosis not present

## 2019-01-06 NOTE — Therapy (Signed)
[address] [place], [place], [address] Phone: [phone]   Fax:  [phone]  Physical Therapy Treatment  Patient Details  Name: Jenna Chambers MRN: [id number] Date of Birth: [birth year] Referring Provider (PT): Victorino December   Encounter Date: 01/06/2019  PT End of Session - 01/06/19 1503    Visit Number  [id number]    Date for PT Re-Evaluation  01/07/19    PT Start Time  1455    PT Stop Time  1540    PT Time Calculation (min)  45 min       Past Medical History:  Diagnosis Date  . Colon cancer ([place])    resecetion  . Diabetes ([place])    Type 2  . Glaucoma    --both eyes per patient getting worse  . Gout   . Hypertension   . Osteopenia   . Sleep apnea    no CPAP  . Ulcerative colitis [facility])     Past Surgical History:  Procedure Laterality Date  . BILATERAL SALPINGOOPHORECTOMY  1993   serous cystadenoma  . CATARACT EXTRACTION  2006, 2008   gluacoma  . EXPLORATORY LAPAROTOMY  1996   incarcerated hernia  . INTRAMEDULLARY (IM) NAIL INTERTROCHANTERIC Right 05/10/2018   Procedure: INTRAMEDULLARY (IM) NAIL INTERTROCHANTRIC;  Surgeon: [name], MD;  Location: [facility];  Service: Orthopedics;  Laterality: Right;  . PARTIAL COLECTOMY  1991  . TOTAL KNEE ARTHROPLASTY Left 2010    There were no vitals filed for this visit.  Subjective Assessment - 01/06/19 1454    Subjective  10 min late.saw MD f/u in 6 weeks and okayed D/C with HEP. When asked pt about previos HEP issued she did not remember.    Currently in Pain?  Yes    Pain Score  2     Pain Location  Leg                       [facility] Adult PT Treatment/Exercise - 01/06/19 0001      Knee/Hip Exercises: Aerobic   Nustep  L6x  7 min       Knee/Hip Exercises: Machines for Strengthening   Cybex Knee Extension  15# 2 x 10    Cybex Knee Flexion  35# 2 sets 15      Knee/Hip Exercises: Standing   Other Standing Knee Exercises   hip 3 way plus march 15 times each with tband      Knee/Hip Exercises: Seated   Long Arc Quad  Strengthening;Both;15 reps   tband   Marching  Strengthening;Both;15 reps   tband   Abduction/Adduction   Strengthening;Both;15 reps   tband            PT Education - 01/06/19 1502    Education Details  LE hip and knee HEP with red tband    Person(s) Educated  Patient    Methods  Explanation;Demonstration;Handout    Comprehension  Verbalized understanding;Returned demonstration       PT Short Term Goals - 11/06/18 1442      PT SHORT TERM GOAL #1   Title  decrease TUG time to 20 seconds    Status  Achieved        PT Long Term Goals - 01/06/19 1502      PT LONG TERM GOAL #1   Title  walk with [facility] for all in house activities    Status  Partially Met  PT LONG TERM GOAL #2   Title  increase right hip flexion in standing to 90 degrees    Status  Partially Met      PT LONG TERM GOAL #3   Title  drive without any worries    Status  Achieved      PT LONG TERM GOAL #4   Title  able to return to grocery shopping    Status  Achieved      PT LONG TERM GOAL #5   Title  increase right hip strength to 4+/5    Status  Achieved            Plan - 01/06/19 1512    Clinical Impression Statement  LTGS partially met. STill not amb with SPC and decreased hip ROM in stadning. Strength improved but still func weakness and fatigued easily. Reissued HEP    PT Treatment/Interventions  ADLs/Self Care Home Management;Electrical Stimulation;Functional mobility training;Stair training;Gait training;Therapeutic activities;Therapeutic exercise;Balance training;Neuromuscular re-education;Manual techniques;Patient/family education    PT Next Visit Plan  D/C with HEP. Will f/u with MD in 6 weeks       Patient will benefit from skilled therapeutic intervention in order to improve the following deficits and impairments:  Abnormal gait, Cardiopulmonary status limiting activity, Decreased  mobility, Decreased activity tolerance, Decreased endurance, Decreased range of motion, Decreased strength, Decreased balance, Difficulty walking, Pain  Visit Diagnosis: Muscle weakness (generalized)  Difficulty in walking, not elsewhere classified  Pain in right hip     Problem List Patient Active Problem List   Diagnosis Date Noted  . Slow transit constipation   . Labile blood pressure   . Postoperative pain   . Displaced fracture of right femoral neck ([place]) 05/14/2018  . Essential hypertension   . Hypothyroidism   . Acute blood loss anemia 05/13/2018  . Obesity, Class III, BMI 40-49.9 (morbid obesity) ([place]) 05/13/2018  . Hip fracture ([place]) 05/10/2018  . Colon cancer ([place]) 02/27/2013  . Essential hypertension, benign 02/27/2013  . Glaucoma 02/27/2013  . Ulcerative colitis ([place]) 02/27/2013  . Diabetes ([place])    PHYSICAL THERAPY DISCHARGE SUMMARY  Plan: Patient agrees to discharge.  Patient goals were not met. Patient is being discharged due to being pleased with the current functional level.  ?????      ,ANGIE 01/06/2019, 3:14 PM  [address] [place], [place], [address] Phone: [phone]   Fax:  [phone]  Name: Jenna Chambers MRN: [id number] Date of Birth: 10/16/1931

## 2019-01-29 DIAGNOSIS — S72001A Fracture of unspecified part of neck of right femur, initial encounter for closed fracture: Secondary | ICD-10-CM | POA: Diagnosis not present

## 2019-01-31 ENCOUNTER — Ambulatory Visit: Payer: Medicare Other | Attending: Internal Medicine

## 2019-01-31 DIAGNOSIS — Z23 Encounter for immunization: Secondary | ICD-10-CM | POA: Insufficient documentation

## 2019-01-31 NOTE — Progress Notes (Signed)
   Covid-19 Vaccination Clinic  Name:  CYRSTAL ALARIE    MRN: [id number] DOB: 1931/09/08  01/31/2019  Ms. Barrack was observed post Covid-19 immunization for 30 minutes based on pre-vaccination screening without incidence. She was provided with Vaccine Information Sheet and instruction to access the V-Safe system.   Ms. [place] was instructed to call 911 with any severe reactions post vaccine: Marland Kitchen Difficulty breathing  . Swelling of your face and throat  . A fast heartbeat  . A bad rash all over your body  . Dizziness and weakness    Immunizations Administered    Name Date Dose VIS Date Route   Pfizer COVID-19 Vaccine 01/31/2019  1:09 PM 0.3 mL 12/26/2018 Intramuscular   Manufacturer: [place]   Lot: [id number]   [place]: [id number]

## 2019-02-20 ENCOUNTER — Ambulatory Visit: Payer: Medicare Other | Attending: Internal Medicine

## 2019-02-20 DIAGNOSIS — Z23 Encounter for immunization: Secondary | ICD-10-CM | POA: Insufficient documentation

## 2019-02-20 NOTE — Progress Notes (Signed)
   Covid-19 Vaccination Clinic  Name:  Jenna Chambers    MRN: [id number] DOB: Nov 07, 1931  02/20/2019  [name] was observed post Covid-19 immunization for 15 minutes without incidence. She was provided with Vaccine Information Sheet and instruction to access the V-Safe system.   [name] was instructed to call 911 with any severe reactions post vaccine: Marland Kitchen Difficulty breathing  . Swelling of your face and throat  . A fast heartbeat  . A bad rash all over your body  . Dizziness and weakness    Immunizations Administered    Name Date Dose VIS Date Route   Pfizer COVID-19 Vaccine 02/20/2019  1:57 PM 0.3 mL 12/26/2018 Intramuscular   Manufacturer: [place]   Lot: [id number]   [place]: [id number]

## 2019-02-27 DIAGNOSIS — I10 Essential (primary) hypertension: Secondary | ICD-10-CM | POA: Diagnosis not present

## 2019-02-27 DIAGNOSIS — E1142 Type 2 diabetes mellitus with diabetic polyneuropathy: Secondary | ICD-10-CM | POA: Diagnosis not present

## 2019-02-27 DIAGNOSIS — E039 Hypothyroidism, unspecified: Secondary | ICD-10-CM | POA: Diagnosis not present

## 2019-02-27 DIAGNOSIS — N189 Chronic kidney disease, unspecified: Secondary | ICD-10-CM | POA: Diagnosis not present

## 2019-03-01 DIAGNOSIS — S72001A Fracture of unspecified part of neck of right femur, initial encounter for closed fracture: Secondary | ICD-10-CM | POA: Diagnosis not present

## 2019-04-09 DIAGNOSIS — H401112 Primary open-angle glaucoma, right eye, moderate stage: Secondary | ICD-10-CM | POA: Diagnosis not present

## 2019-04-09 DIAGNOSIS — Z961 Presence of intraocular lens: Secondary | ICD-10-CM | POA: Diagnosis not present

## 2019-05-26 DIAGNOSIS — E039 Hypothyroidism, unspecified: Secondary | ICD-10-CM | POA: Diagnosis not present

## 2019-05-26 DIAGNOSIS — I10 Essential (primary) hypertension: Secondary | ICD-10-CM | POA: Diagnosis not present

## 2019-05-26 DIAGNOSIS — M810 Age-related osteoporosis without current pathological fracture: Secondary | ICD-10-CM | POA: Diagnosis not present

## 2019-05-26 DIAGNOSIS — M1990 Unspecified osteoarthritis, unspecified site: Secondary | ICD-10-CM | POA: Diagnosis not present

## 2019-06-04 DIAGNOSIS — H04123 Dry eye syndrome of bilateral lacrimal glands: Secondary | ICD-10-CM | POA: Diagnosis not present

## 2019-06-04 DIAGNOSIS — H401134 Primary open-angle glaucoma, bilateral, indeterminate stage: Secondary | ICD-10-CM | POA: Diagnosis not present

## 2019-06-17 DIAGNOSIS — M109 Gout, unspecified: Secondary | ICD-10-CM | POA: Diagnosis not present

## 2019-06-17 DIAGNOSIS — M79671 Pain in right foot: Secondary | ICD-10-CM | POA: Diagnosis not present

## 2019-06-18 DIAGNOSIS — M109 Gout, unspecified: Secondary | ICD-10-CM | POA: Diagnosis not present

## 2019-07-06 DIAGNOSIS — Z8739 Personal history of other diseases of the musculoskeletal system and connective tissue: Secondary | ICD-10-CM | POA: Diagnosis not present

## 2019-07-06 DIAGNOSIS — M25571 Pain in right ankle and joints of right foot: Secondary | ICD-10-CM | POA: Diagnosis not present

## 2019-07-06 DIAGNOSIS — M79645 Pain in left finger(s): Secondary | ICD-10-CM | POA: Diagnosis not present

## 2019-07-06 DIAGNOSIS — M779 Enthesopathy, unspecified: Secondary | ICD-10-CM | POA: Diagnosis not present

## 2019-07-17 DIAGNOSIS — M79645 Pain in left finger(s): Secondary | ICD-10-CM | POA: Diagnosis not present

## 2019-07-17 DIAGNOSIS — M79671 Pain in right foot: Secondary | ICD-10-CM | POA: Diagnosis not present

## 2019-08-13 DIAGNOSIS — M79671 Pain in right foot: Secondary | ICD-10-CM | POA: Diagnosis not present

## 2019-08-31 DIAGNOSIS — I10 Essential (primary) hypertension: Secondary | ICD-10-CM | POA: Diagnosis not present

## 2019-08-31 DIAGNOSIS — E7800 Pure hypercholesterolemia, unspecified: Secondary | ICD-10-CM | POA: Diagnosis not present

## 2019-08-31 DIAGNOSIS — E1142 Type 2 diabetes mellitus with diabetic polyneuropathy: Secondary | ICD-10-CM | POA: Diagnosis not present

## 2019-08-31 DIAGNOSIS — Z0000 Encounter for general adult medical examination without abnormal findings: Secondary | ICD-10-CM | POA: Diagnosis not present

## 2019-10-01 DIAGNOSIS — K5904 Chronic idiopathic constipation: Secondary | ICD-10-CM | POA: Diagnosis not present

## 2019-10-01 DIAGNOSIS — Z85038 Personal history of other malignant neoplasm of large intestine: Secondary | ICD-10-CM | POA: Diagnosis not present

## 2019-10-01 DIAGNOSIS — K529 Noninfective gastroenteritis and colitis, unspecified: Secondary | ICD-10-CM | POA: Diagnosis not present

## 2019-10-15 DIAGNOSIS — H401112 Primary open-angle glaucoma, right eye, moderate stage: Secondary | ICD-10-CM | POA: Diagnosis not present

## 2019-10-20 DIAGNOSIS — M19071 Primary osteoarthritis, right ankle and foot: Secondary | ICD-10-CM | POA: Diagnosis not present

## 2019-10-20 DIAGNOSIS — M21611 Bunion of right foot: Secondary | ICD-10-CM | POA: Diagnosis not present

## 2019-10-20 DIAGNOSIS — I890 Lymphedema, not elsewhere classified: Secondary | ICD-10-CM | POA: Diagnosis not present

## 2019-11-06 DIAGNOSIS — Z23 Encounter for immunization: Secondary | ICD-10-CM | POA: Diagnosis not present

## 2019-12-14 DIAGNOSIS — E039 Hypothyroidism, unspecified: Secondary | ICD-10-CM | POA: Diagnosis not present

## 2020-01-26 DIAGNOSIS — G629 Polyneuropathy, unspecified: Secondary | ICD-10-CM | POA: Diagnosis not present

## 2020-01-26 DIAGNOSIS — I890 Lymphedema, not elsewhere classified: Secondary | ICD-10-CM | POA: Diagnosis not present

## 2020-01-26 DIAGNOSIS — M79671 Pain in right foot: Secondary | ICD-10-CM | POA: Diagnosis not present

## 2020-02-18 DIAGNOSIS — M810 Age-related osteoporosis without current pathological fracture: Secondary | ICD-10-CM | POA: Diagnosis not present

## 2020-02-18 DIAGNOSIS — Z1231 Encounter for screening mammogram for malignant neoplasm of breast: Secondary | ICD-10-CM | POA: Diagnosis not present

## 2020-02-18 DIAGNOSIS — Z780 Asymptomatic menopausal state: Secondary | ICD-10-CM | POA: Diagnosis not present

## 2020-02-18 DIAGNOSIS — M85852 Other specified disorders of bone density and structure, left thigh: Secondary | ICD-10-CM | POA: Diagnosis not present

## 2020-03-02 DIAGNOSIS — N183 Chronic kidney disease, stage 3 unspecified: Secondary | ICD-10-CM | POA: Diagnosis not present

## 2020-03-02 DIAGNOSIS — I10 Essential (primary) hypertension: Secondary | ICD-10-CM | POA: Diagnosis not present

## 2020-03-02 DIAGNOSIS — E1142 Type 2 diabetes mellitus with diabetic polyneuropathy: Secondary | ICD-10-CM | POA: Diagnosis not present

## 2020-03-02 DIAGNOSIS — R600 Localized edema: Secondary | ICD-10-CM | POA: Diagnosis not present

## 2020-03-25 ENCOUNTER — Other Ambulatory Visit: Payer: Self-pay | Admitting: *Deleted

## 2020-03-25 DIAGNOSIS — I890 Lymphedema, not elsewhere classified: Secondary | ICD-10-CM

## 2020-04-01 ENCOUNTER — Ambulatory Visit: Payer: Medicare Other | Admitting: Physician Assistant

## 2020-04-01 ENCOUNTER — Ambulatory Visit (HOSPITAL_COMMUNITY)
Admission: RE | Admit: 2020-04-01 | Discharge: 2020-04-01 | Disposition: A | Discharge status: Home / Self Care | Payer: Medicare Other | Source: Ambulatory Visit | Attending: Vascular Surgery | Admitting: Vascular Surgery

## 2020-04-01 ENCOUNTER — Other Ambulatory Visit: Payer: Self-pay

## 2020-04-01 VITALS — BP 141/83 | HR 70 | Resp 16 | Ht 63.0 in | Wt 217.0 lb

## 2020-04-01 DIAGNOSIS — M7989 Other specified soft tissue disorders: Secondary | ICD-10-CM

## 2020-04-01 DIAGNOSIS — I890 Lymphedema, not elsewhere classified: Secondary | ICD-10-CM | POA: Diagnosis not present

## 2020-04-01 NOTE — Progress Notes (Signed)
[facility]           [facility]  History and Physical   Jenna Chambers is a 87 y.o. female who presents with bilateral leg swelling with the right worse than left.  She does have hx of right femur fracture in the past.  She states that her leg swelling worsened around June of last year.  She has pain around the right ankle.  She has small spider veins present around the right foot.  She does not have any hx of DVT.  She does have family hx of varicose veins with her mother and sister.  She does wear compression socks, but she cannot put these on by herself.  She does have hx of exploratory laparotomy with partial colectomy for colon cancer.  She elevates legs in a recliner, but does not elevate them high enough.  Her swelling is better in the mornings when she wakes.   She does have hx of CKD III that is followed by her primary care MD.    The pt is not on a statin for cholesterol management.  The pt is on a daily aspirin.   Other AC:  none The pt is on CCB, ARB for hypertension.   The pt is not diabetic.   Tobacco hx:  never   Past Medical History:  Diagnosis Date  . Colon cancer ([place])    resecetion  . Diabetes ([place])    Type 2  . Glaucoma    --both eyes per patient getting worse  . Gout   . Hypertension   . Osteopenia   . Sleep apnea    no CPAP  . Ulcerative colitis [facility])     Past Surgical History:  Procedure Laterality Date  . BILATERAL SALPINGOOPHORECTOMY  1993   serous cystadenoma  . CATARACT EXTRACTION  2006, 2008   gluacoma  . EXPLORATORY LAPAROTOMY  1996   incarcerated hernia  . INTRAMEDULLARY (IM) NAIL INTERTROCHANTERIC Right 05/10/2018   Procedure: INTRAMEDULLARY (IM) NAIL INTERTROCHANTRIC;  Surgeon: [name], MD;  Location: [facility];  Service: Orthopedics;  Laterality: Right;  . PARTIAL COLECTOMY  1991  . TOTAL KNEE ARTHROPLASTY Left 2010    Social History   Socioeconomic History  . Marital status: Widowed     Spouse name: Not on file  . Number of children: Not on file  . Years of education: Not on file  . Highest education level: Not on file  Occupational History  . Occupation: retired  Tobacco Use  . Smoking status: Never Smoker  . Smokeless tobacco: Never Used  Substance and Sexual Activity  . Alcohol use: No    Alcohol/week: 0.0 standard drinks  . Drug use: No  . Sexual activity: Never    Partners: Male    Birth control/protection: Post-menopausal  Other Topics Concern  . Not on file  Social History Narrative  . Not on file   Social Determinants of Health   Financial Resource Strain: Not on file  Food Insecurity: Not on file  Transportation Needs: Not on file  Physical Activity: Not on file  Stress: Not on file  Social Connections: Not on file  Intimate Partner Violence: Not on file     Family History  Problem Relation Age of Onset  . Heart disease Father        dec age 37  . Hypertension Father   . Osteoarthritis Mother   . Osteoporosis Mother   .  Hypertension Sister   . Hypertension Brother   . Cancer Brother 72       colon ca    Current Outpatient Medications  Medication Sig Dispense Refill  . acetaminophen (TYLENOL) 500 MG tablet Take 1,000 mg by mouth every 6 (six) hours as needed.    Marland Kitchen amLODipine (NORVASC) 10 MG tablet Take 1 tablet (10 mg total) by mouth daily. 30 tablet 0  . aspirin 81 MG tablet Take 81 mg by mouth daily.    . brimonidine (ALPHAGAN P) 0.1 % SOLN Place 1 drop into both eyes 2 (two) times daily.    . brinzolamide (AZOPT) 1 % ophthalmic suspension Place 1 drop into both eyes 2 (two) times a day.    . Cholecalciferol (VITAMIN D) 2000 UNITS CAPS Take by mouth.    . diclofenac sodium (VOLTAREN) 1 % GEL Apply 2 g topically 4 (four) times daily. 1 Tube 1  . folic acid (FOLVITE) 1 MG tablet Take 1 tablet (1 mg total) by mouth daily. 30 tablet 0  . hydrALAZINE (APRESOLINE) 10 MG tablet Take 1 tablet (10 mg total) by mouth every 8 (eight) hours. 90  tablet 0  . HYDROcodone-acetaminophen (NORCO/VICODIN) 5-325 MG tablet Take 0.5-1 tablets by mouth every 6 (six) hours as needed for moderate pain. 30 tablet 0  . levothyroxine (SYNTHROID) 150 MCG tablet Take 1 tablet (150 mcg total) by mouth daily before breakfast. 30 tablet 0  . losartan (COZAAR) 100 MG tablet Take 1 tablet (100 mg total) by mouth daily. 30 tablet 0  . Multiple Vitamin (MULTIVITAMIN WITH MINERALS) TABS tablet Take 1 tablet by mouth daily.    . polyethylene glycol (MIRALAX / GLYCOLAX) 17 g packet Take 17 g by mouth 2 (two) times daily.    Marland Kitchen sulfaSALAzine (AZULFIDINE) 500 MG tablet Take 1,000 mg by mouth 2 (two) times daily.    . Tafluprost, PF, (ZIOPTAN) 0.0015 % SOLN Place 1 drop into both eyes at bedtime.    . traZODone (DESYREL) 50 MG tablet Take 0.5 tablets (25 mg total) by mouth at bedtime. 10 tablet 0  . ZIOPTAN 0.0015 % SOLN Place 1 drop into both eyes daily.  12   No current facility-administered medications for this visit.    Allergies  Allergen Reactions  . Latex Rash  . Penicillins Rash    REVIEW OF SYSTEMS:   [X]  denotes positive finding, [ ]  denotes negative finding Cardiac  Comments:  Chest pain or chest pressure:    Shortness of breath upon exertion:    Short of breath when lying flat:    Irregular heart rhythm:        Vascular    Pain in calf, thigh, or hip brought on by ambulation:    Pain in feet at night that wakes you up from your sleep:     Blood clot in your veins:    Leg swelling:  x       Pulmonary    Oxygen at home:    Productive cough:     Wheezing:         Neurologic    Sudden weakness in arms or legs:     Sudden numbness in arms or legs:     Sudden onset of difficulty speaking or slurred speech:    Temporary loss of vision in one eye:     Problems with dizziness:         Gastrointestinal    Blood in stool:     Vomited blood:  Genitourinary    Burning when urinating:     Blood in urine:        Psychiatric     Major depression:         Hematologic    Bleeding problems:    Problems with blood clotting too easily:        Skin    Rashes or ulcers:        Constitutional    Fever or chills:      PHYSICAL EXAMINATION:  Today's Vitals   04/01/20 1106  BP: (!) 141/83  Pulse: 70  Resp: 16  SpO2: 98%  Weight: 217 lb (98.4 kg)  Height: 5\' 3"  (1.6 m)   Body mass index is 38.44 kg/m.   General:  WDWN in NAD; vital signs documented above Gait: Not observed HENT: WNL, normocephalic Pulmonary: normal non-labored breathing without wheezing Cardiac: regular HR; without carotid bruits Abdomen: obese Skin: without rashes Vascular Exam/Pulses:  Right Left  Radial 2+ (normal) 2+ (normal)  DP Brisk biphasic doppler 2+ (normal)  PT Brisk biphasic doppler Brisk biphasic doppler   Extremities: without ischemic changes, without cellulitis; without open wounds; she does have pitting edema RLE; swelling BLE with right > left Musculoskeletal: no muscle wasting or atrophy  Neurologic: A&O X 3;  moving all extremities equally Psychiatric:  The pt has Normal affect.   Non-Invasive Vascular Imaging:   Venous duplex on 04/01/2020: Venous Reflux Times  +--------------+---------+------+-----------+------------+--------+  RIGHT     Reflux NoRefluxReflux TimeDiameter cmsComments               Yes                   +--------------+---------+------+-----------+------------+--------+  CFV            yes  >1 second             +--------------+---------+------+-----------+------------+--------+  FV mid    no                         +--------------+---------+------+-----------+------------+--------+  Popliteal   no                         +--------------+---------+------+-----------+------------+--------+  GSV at [facility]  no               0.63         +--------------+---------+------+-----------+------------+--------+  GSV prox thighno               0.46        +--------------+---------+------+-----------+------------+--------+  GSV mid thigh no               0.59        +--------------+---------+------+-----------+------------+--------+  GSV dist thighno               0.39        +--------------+---------+------+-----------+------------+--------+  GSV at knee  no               0.4         +--------------+---------+------+-----------+------------+--------+  GSV prox calf no               0.27        +--------------+---------+------+-----------+------------+--------+  SSV Pop Fossa no               0.21        +--------------+---------+------+-----------+------------+--------+  SSV prox calf no  0.24        +--------------+---------+------+-----------+------------+--------+  SSV mid calf no               0.2         +--------------+---------+------+-----------+------------+--------+     +--------------+---------+------+-----------+------------+--------+  LEFT     Reflux NoRefluxReflux TimeDiameter cmsComments               Yes                   +--------------+---------+------+-----------+------------+--------+  CFV            yes  >1 second             +--------------+---------+------+-----------+------------+--------+  FV mid    no                         +--------------+---------+------+-----------+------------+--------+  Popliteal   no                         +--------------+---------+------+-----------+------------+--------+  GSV at SFJ        yes  >500 ms    0.63        +--------------+---------+------+-----------+------------+--------+  GSV prox thighno               0.51        +--------------+---------+------+-----------+------------+--------+  GSV mid thigh no               0.52        +--------------+---------+------+-----------+------------+--------+  GSV dist thighno               0.43        +--------------+---------+------+-----------+------------+--------+  GSV at knee  no               0.43        +--------------+---------+------+-----------+------------+--------+  GSV prox calf no               0.47        +--------------+---------+------+-----------+------------+--------+  SSV Pop Fossa no               0.24        +--------------+---------+------+-----------+------------+--------+  SSV prox calf no               0.21        +--------------+---------+------+-----------+------------+--------+  SSV mid calf no               0.23        +--------------+---------+------+-----------+------------+--------+   Summary:  Right:  - No evidence of deep vein thrombosis seen in the right lower extremity,  from the common femoral through the popliteal veins.  - No evidence of superficial venous thrombosis in the right lower  extremity.  - Venous reflux is noted in the right common femoral vein.  - No evidence of superficial venous reflux.    Left:  - No evidence of deep vein thrombosis seen in the left lower extremity,  from the common femoral through the popliteal veins.  - No evidence of superficial venous thrombosis in the left lower  extremity.  - Venous reflux is noted in the left common femoral vein.  - Venous reflux is noted in the left sapheno-femoral junction.  - No other superficial venous reflux past the SFJ.     Jenna Chambers is a 87 y.o. female who presents with: BLE leg swelling  Pt with palpable left DP pulse and brisk biphasic DP and bilateral PT doppler signals.  She does not have any evidence of DVT on duplex today in either leg.   -on duplex today, she only has refux in the CFV on the right and CFV and SFJ on the left.  There is no reflux in the superficial venous systerm. -discussed with pt about wearing knee high 15-78mmHg compression stockings and trying to put these on before ever getting out of bed in the morning.  She was measured today for proper sizing for compression socks.  -discussed the importance of leg elevation and how to elevate properly - pt is advised to elevate their legs and a diagram is given to them to demonstrate to lay flat on their back with knees elevated and slightly bent with their feet higher than her knees, which puts their feet higher than their heart for 15 minutes per day.  If they cannot lay flat, advised to lay as flat as possible.  -pt is advised to continue as much walking as possible and avoid sitting or standing for long periods of time.  -discussed importance of weight loss and exercise and that water aerobics would also be beneficial.  -handout with recommendations given -pt will f/u as needed.    Leontine Locket, [facility] 04/01/2020 11:08 AM  Clinic MD:  Donzetta Matters

## 2020-04-13 DIAGNOSIS — H401112 Primary open-angle glaucoma, right eye, moderate stage: Secondary | ICD-10-CM | POA: Diagnosis not present

## 2020-04-13 DIAGNOSIS — H1789 Other corneal scars and opacities: Secondary | ICD-10-CM | POA: Diagnosis not present

## 2020-04-28 DIAGNOSIS — S72001A Fracture of unspecified part of neck of right femur, initial encounter for closed fracture: Secondary | ICD-10-CM | POA: Diagnosis not present

## 2020-05-25 DIAGNOSIS — Z961 Presence of intraocular lens: Secondary | ICD-10-CM | POA: Diagnosis not present

## 2020-05-25 DIAGNOSIS — Z8669 Personal history of other diseases of the nervous system and sense organs: Secondary | ICD-10-CM | POA: Diagnosis not present

## 2020-05-25 DIAGNOSIS — H18523 Epithelial (juvenile) corneal dystrophy, bilateral: Secondary | ICD-10-CM | POA: Diagnosis not present

## 2020-05-25 DIAGNOSIS — H401134 Primary open-angle glaucoma, bilateral, indeterminate stage: Secondary | ICD-10-CM | POA: Diagnosis not present

## 2020-06-06 IMAGING — DX PORTABLE PELVIS 1-2 VIEWS
1 series · 1 of 1 positions shown · non-contrast
Comparison: Pelvic CT 09/28/2017.

CLINICAL DATA: Fall today.  Hip pain.

EXAM:
PORTABLE PELVIS 1-2 VIEWS

[pelvis]
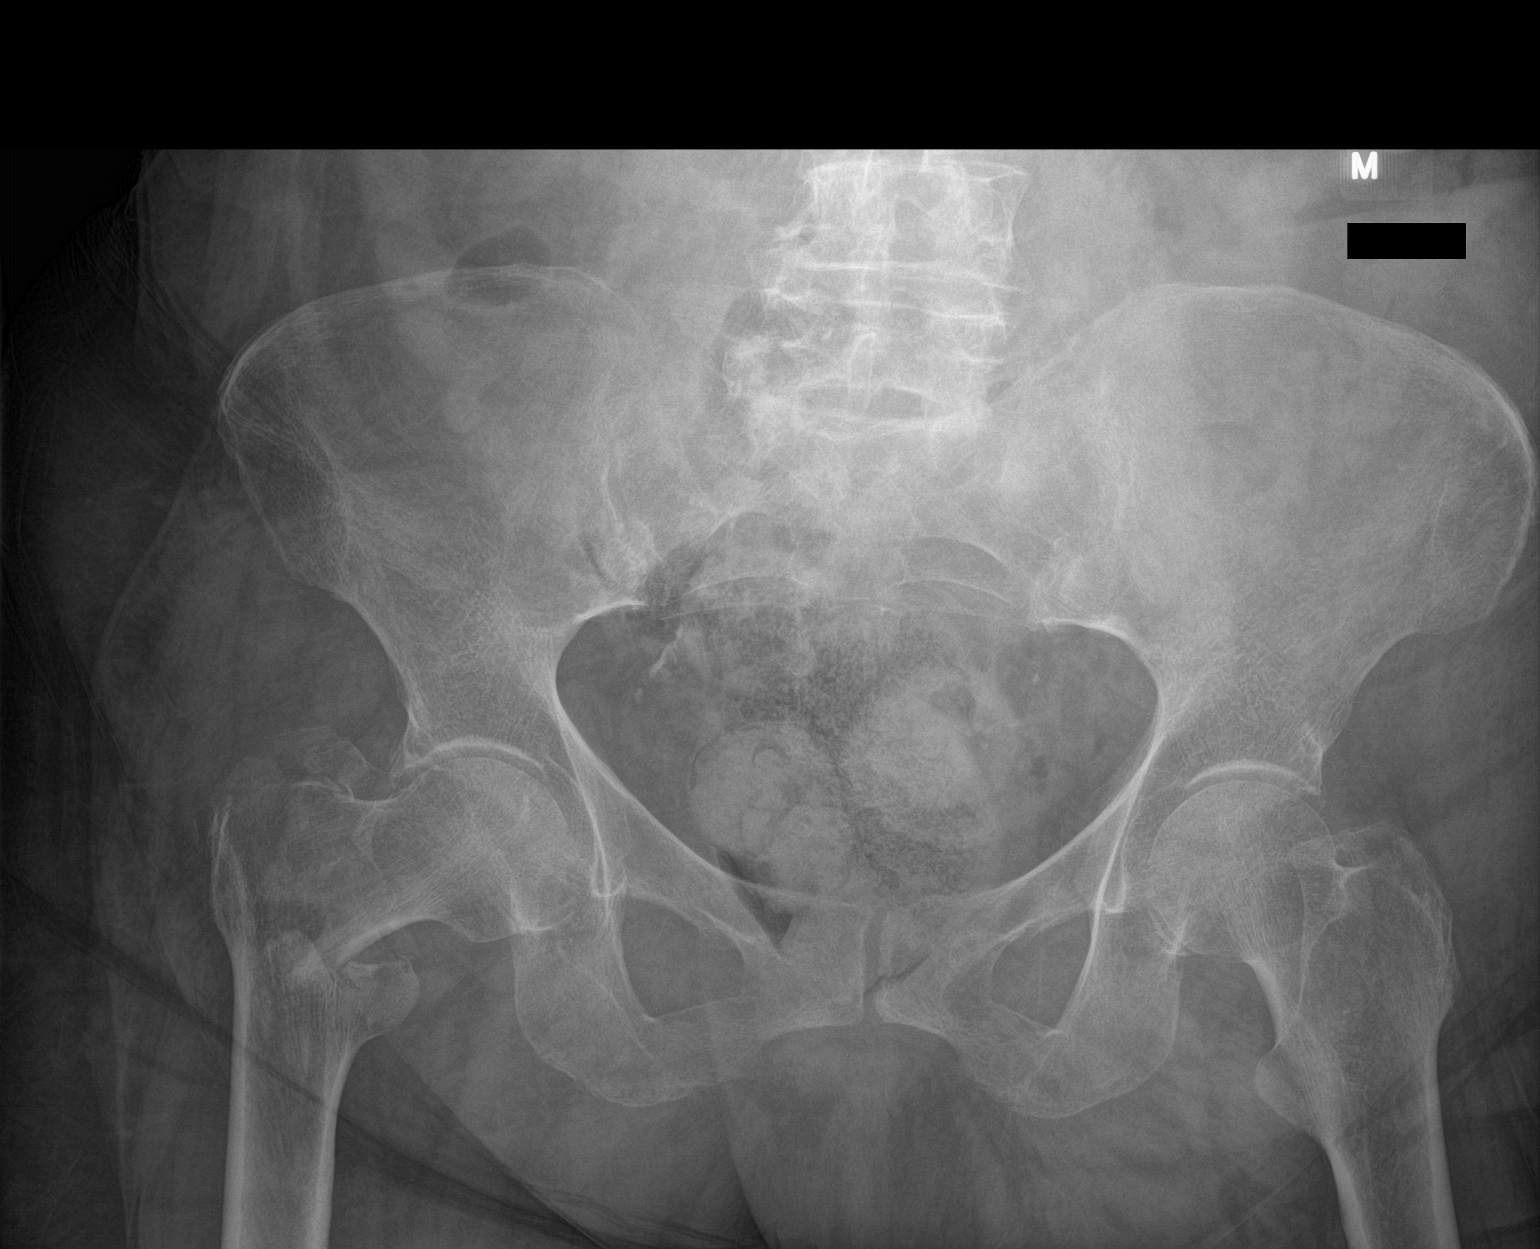

[1 of 1 positions shown; findings below may reference images not displayed]

FINDINGS: The bones appear adequately mineralized. There is an acute
intertrochanteric right femur fracture which is mildly comminuted
and moderately angulated. The femoral head is located. No evidence
pelvic fracture. Mild lower lumbar spondylosis noted.
IMPRESSION: Comminuted and angulated intertrochanteric right femur fracture.

## 2020-06-06 IMAGING — RF DG C-ARM 61-120 MIN
1 series · 5 of 5 positions shown · non-contrast
Comparison: 05/10/2018

CLINICAL DATA: Right hip fracture

EXAM:
DG C-ARM 61-120 MIN; RIGHT FEMUR 2 VIEWS

[Series 1: run · 5 of 5 slices shown]
[im 1/5]
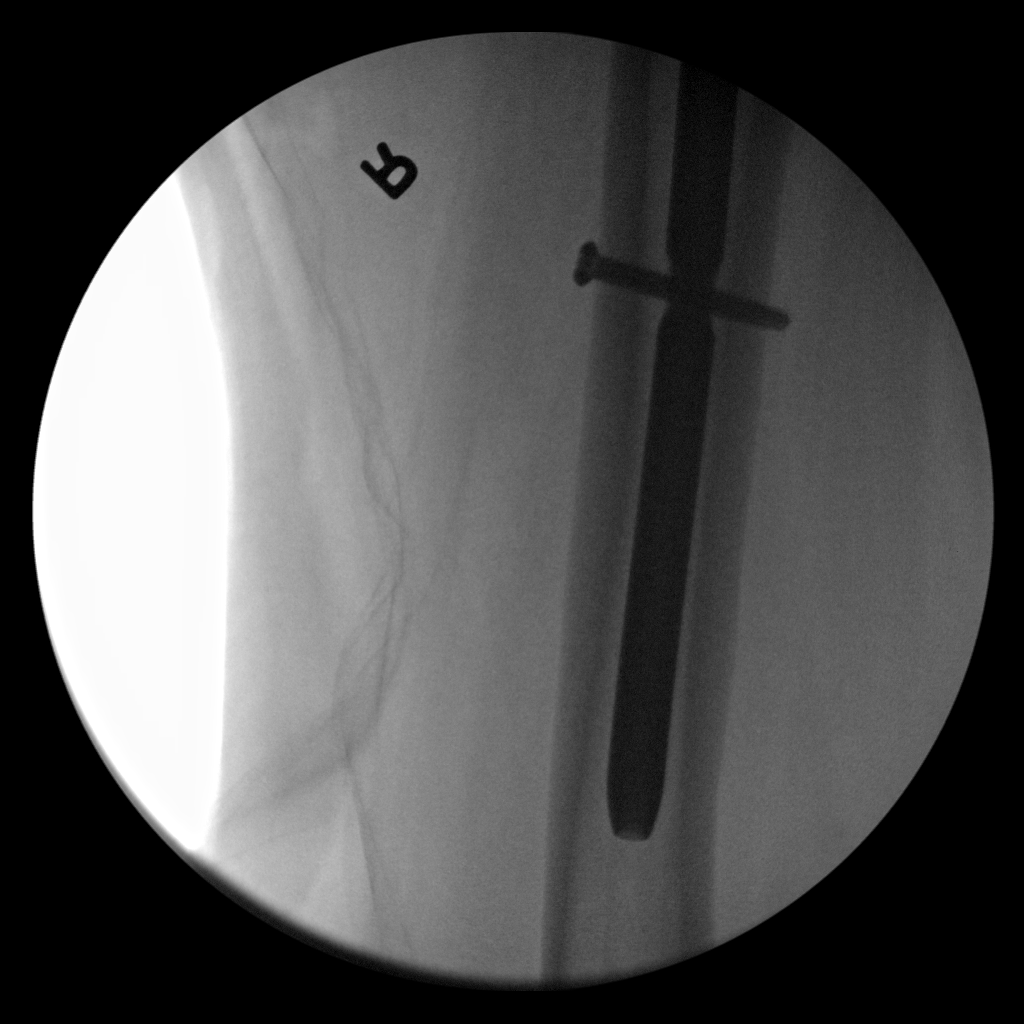
[im 2/5]
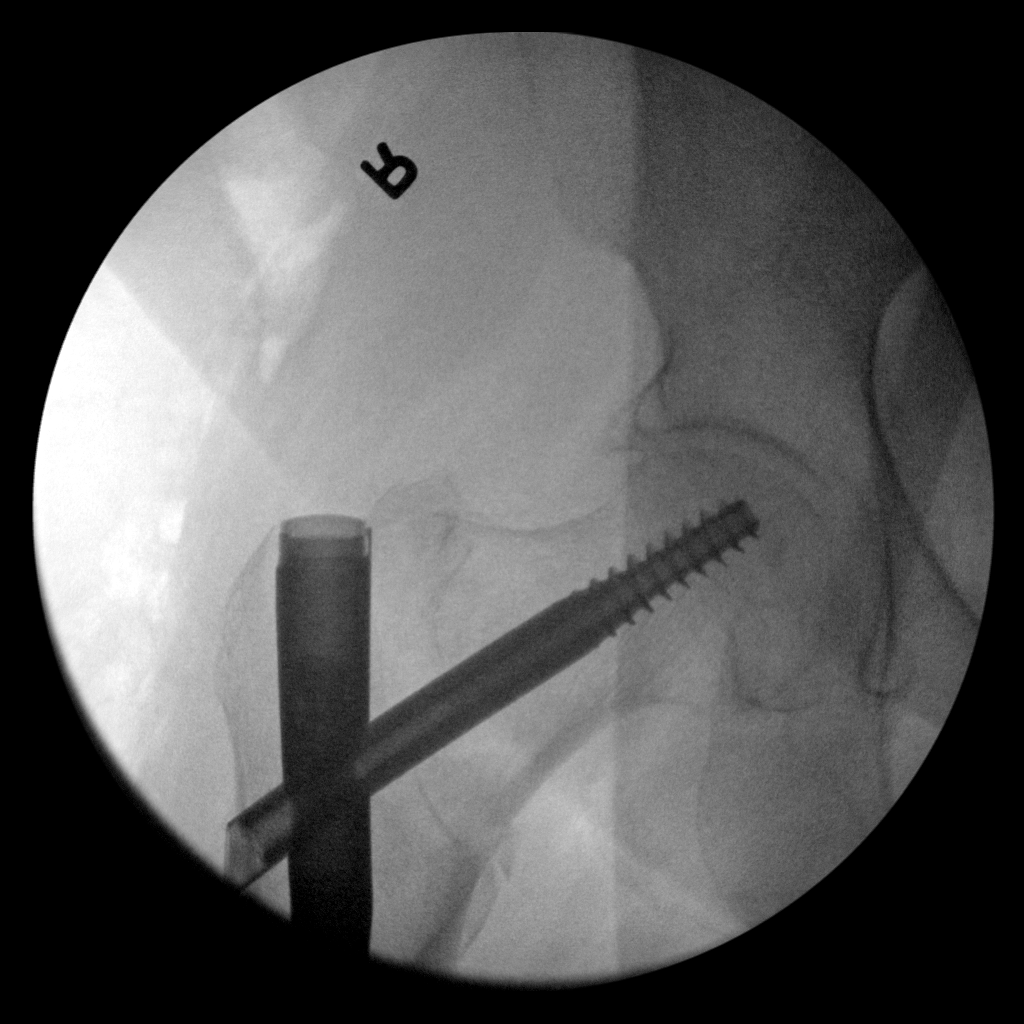
[im 3/5]
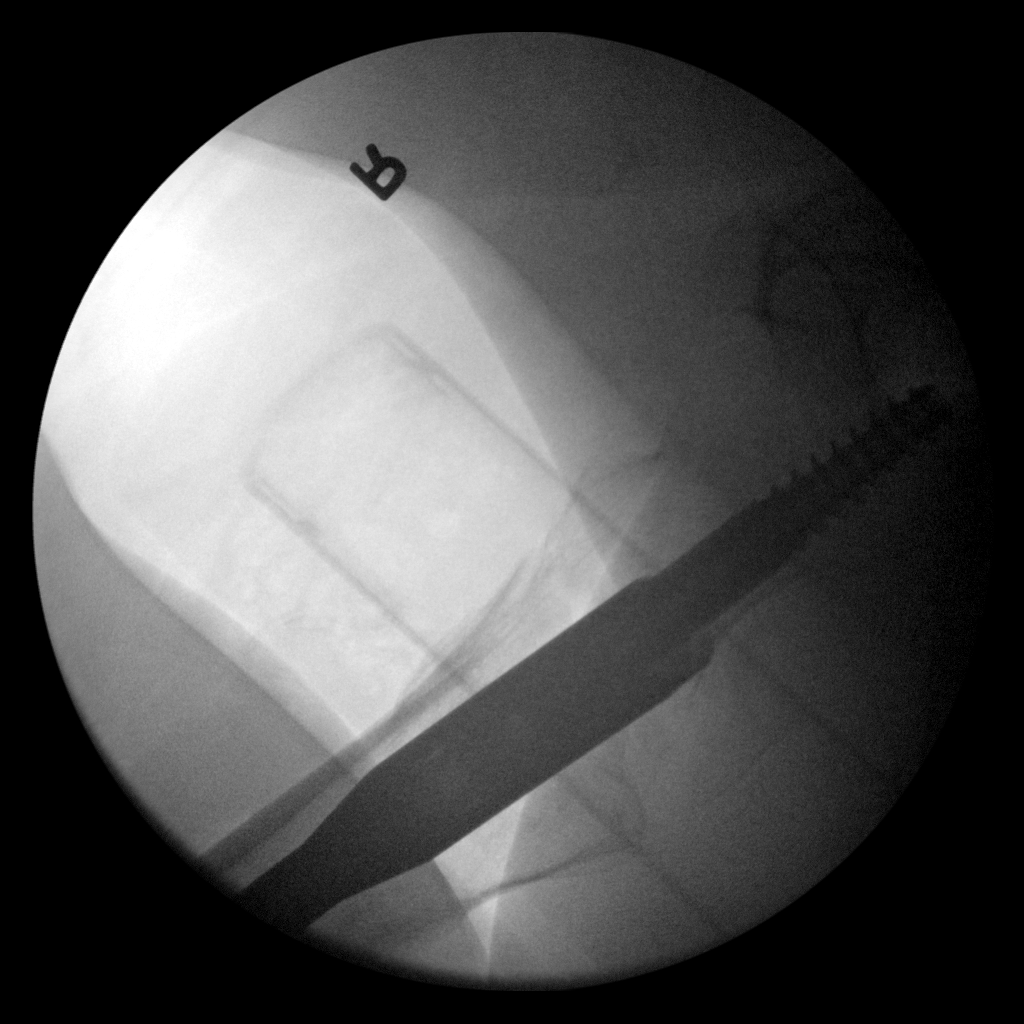
[im 4/5]
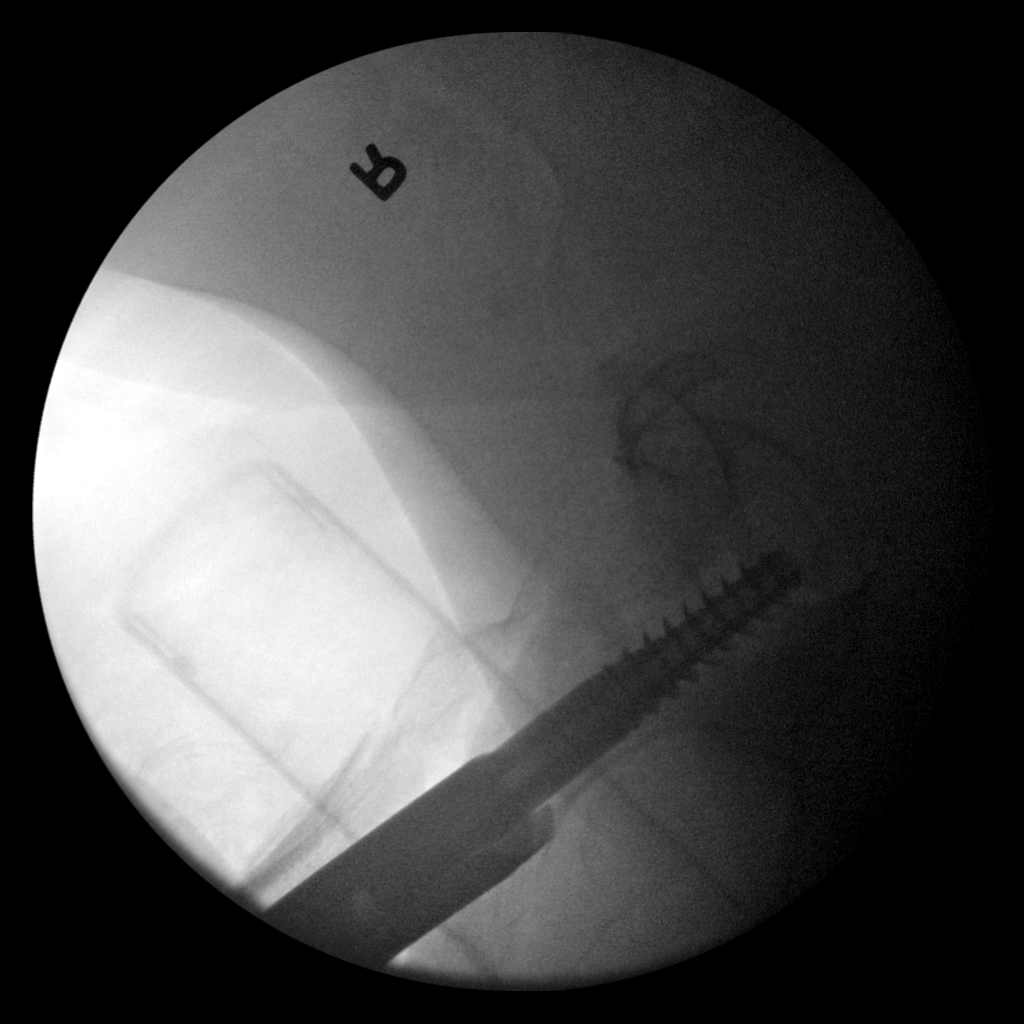
[im 5/5]
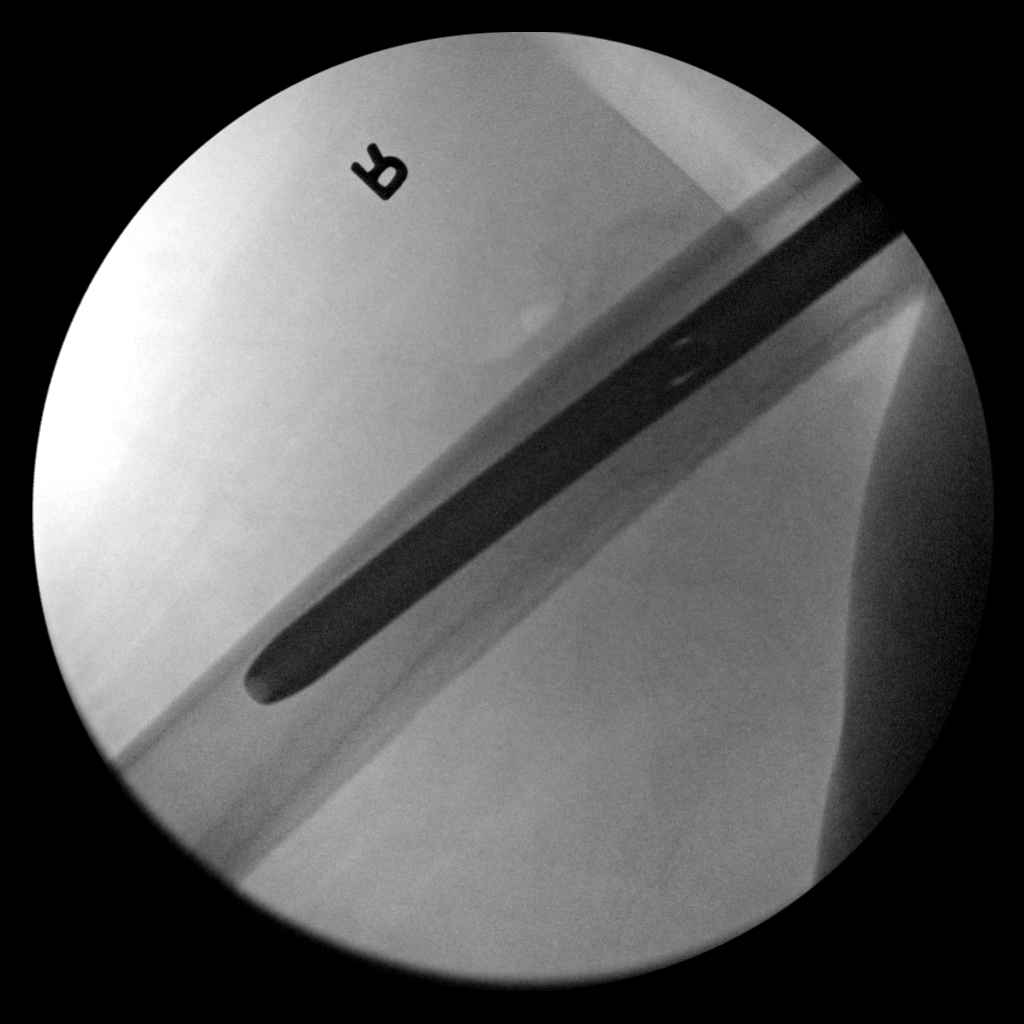

[5 of 5 positions shown; findings below may reference images not displayed]

FINDINGS: Five low resolution intraoperative spot views of the right hip.
Total fluoroscopy time was 1 minutes 9 seconds. The images
demonstrate intramedullary rod and screw fixation of right
intertrochanteric fracture.
IMPRESSION: Intraoperative fluoroscopic assistance provided during surgical
fixation of right hip fracture

## 2020-06-23 IMAGING — DX DG HIP (WITH OR WITHOUT PELVIS) 1V RIGHT
4 series · 4 of 4 positions shown · non-contrast
Comparison: 05/10/2018

CLINICAL DATA: Fall 2 weeks ago with persistent right hip pain,
initial encounter

EXAM:
DG HIP (WITH OR WITHOUT PELVIS) 1V RIGHT

[t pelvis ap]
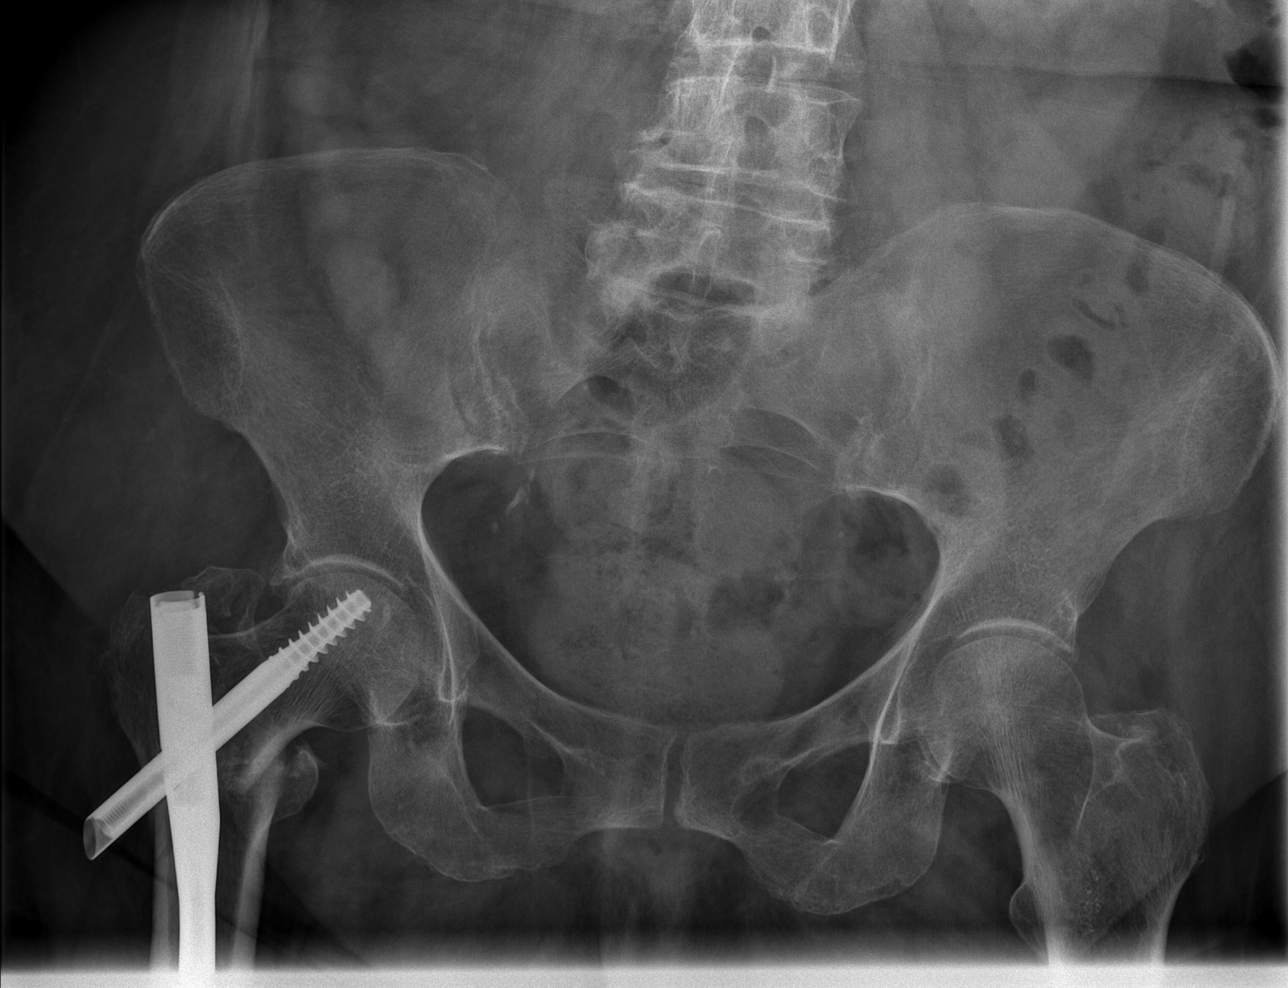

[t hip ap right (1 of 2)]
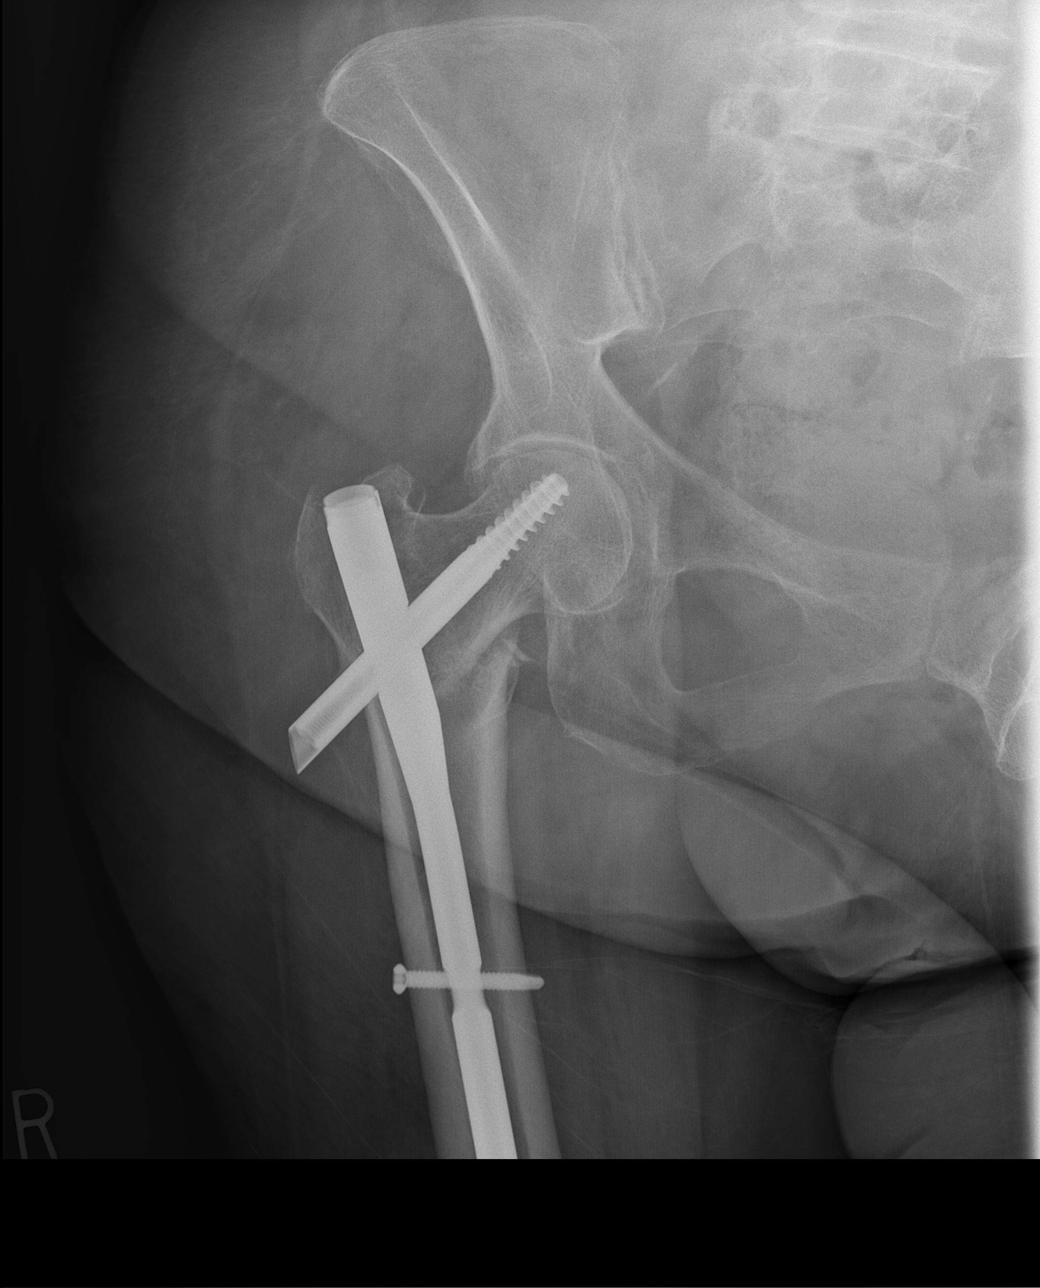

[t hip ap right (2 of 2)]
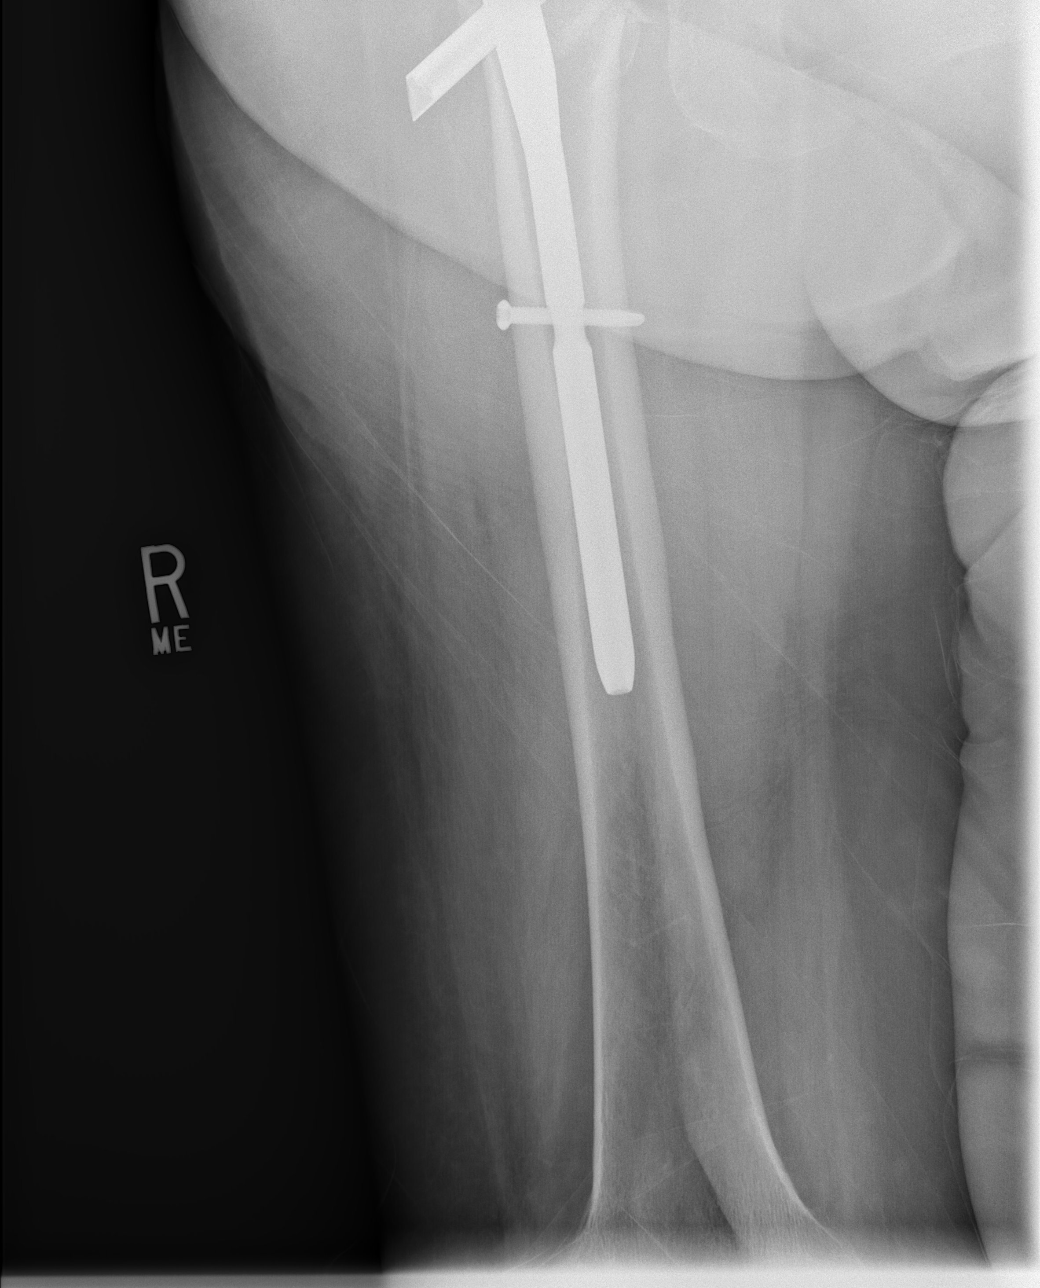

[t hip frog leg right]
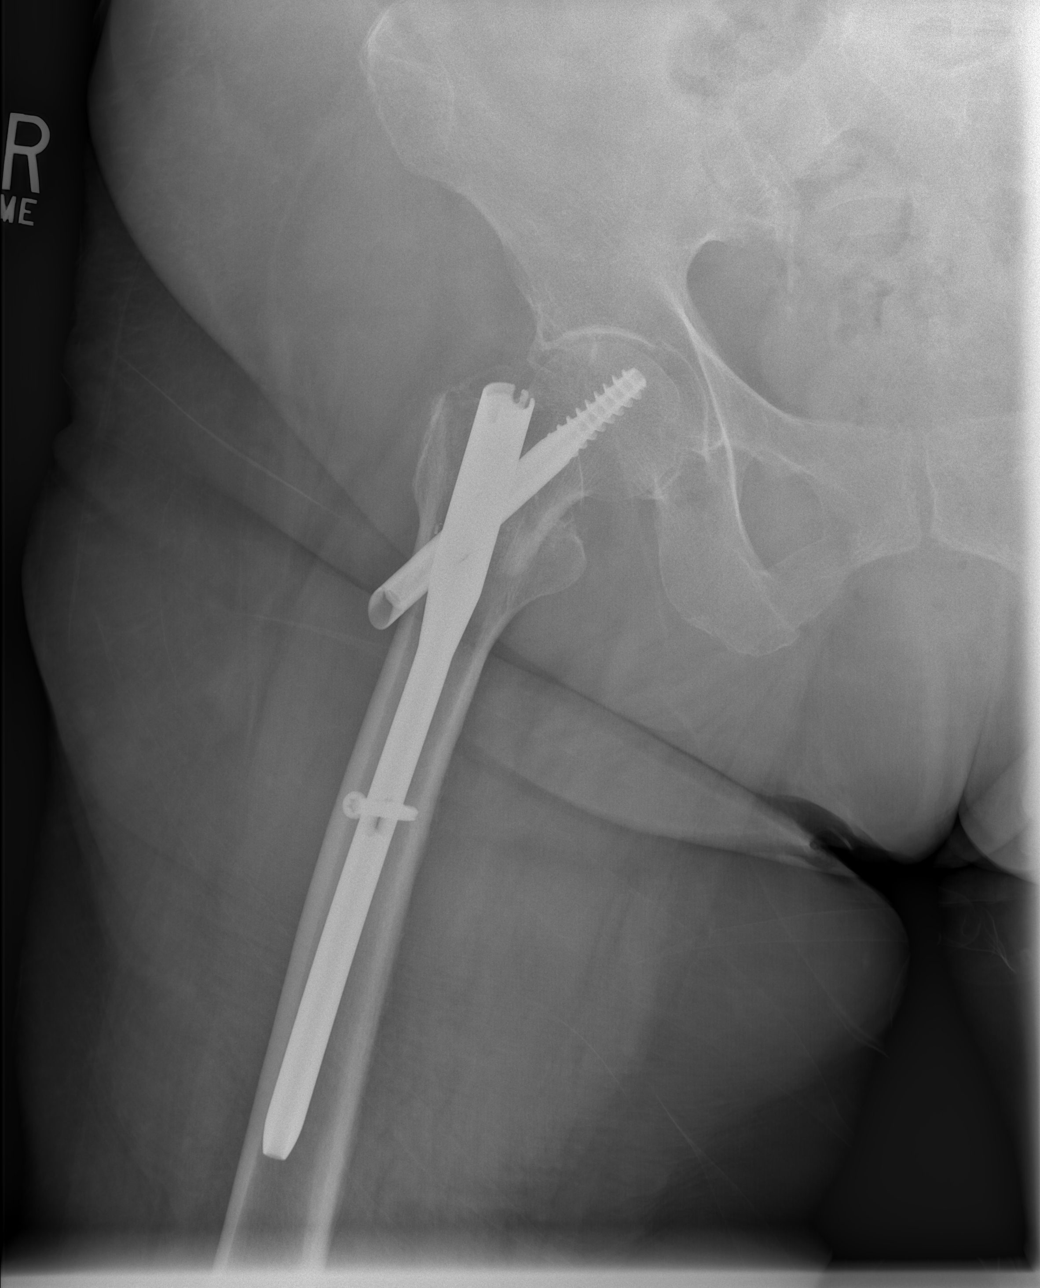

[4 of 4 positions shown; findings below may reference images not displayed]

FINDINGS: Postsurgical changes are noted in the proximal right femur. The
fixation screw traversing the femoral neck has withdrawn
significantly from the medullary rod and there is increased
impaction of the right femoral fracture when compared with the prior
intraoperative exam. No other fracture is seen. No other focal
abnormality is noted.
IMPRESSION: Changes consistent with further impaction of the right femoral
fracture with some dislodgement of the fixation screw traversing the
femoral neck at its junction with the medullary rod.

## 2020-07-11 DIAGNOSIS — Z961 Presence of intraocular lens: Secondary | ICD-10-CM | POA: Diagnosis not present

## 2020-07-11 DIAGNOSIS — H18523 Epithelial (juvenile) corneal dystrophy, bilateral: Secondary | ICD-10-CM | POA: Diagnosis not present

## 2020-07-11 DIAGNOSIS — H401134 Primary open-angle glaucoma, bilateral, indeterminate stage: Secondary | ICD-10-CM | POA: Diagnosis not present

## 2020-07-11 DIAGNOSIS — H04123 Dry eye syndrome of bilateral lacrimal glands: Secondary | ICD-10-CM | POA: Diagnosis not present

## 2020-08-31 DIAGNOSIS — E039 Hypothyroidism, unspecified: Secondary | ICD-10-CM | POA: Diagnosis not present

## 2020-08-31 DIAGNOSIS — E1142 Type 2 diabetes mellitus with diabetic polyneuropathy: Secondary | ICD-10-CM | POA: Diagnosis not present

## 2020-08-31 DIAGNOSIS — I10 Essential (primary) hypertension: Secondary | ICD-10-CM | POA: Diagnosis not present

## 2020-08-31 DIAGNOSIS — Z0000 Encounter for general adult medical examination without abnormal findings: Secondary | ICD-10-CM | POA: Diagnosis not present

## 2020-08-31 DIAGNOSIS — E7800 Pure hypercholesterolemia, unspecified: Secondary | ICD-10-CM | POA: Diagnosis not present

## 2020-09-20 DIAGNOSIS — L720 Epidermal cyst: Secondary | ICD-10-CM | POA: Diagnosis not present

## 2020-09-20 DIAGNOSIS — L821 Other seborrheic keratosis: Secondary | ICD-10-CM | POA: Diagnosis not present

## 2020-10-17 DIAGNOSIS — H401112 Primary open-angle glaucoma, right eye, moderate stage: Secondary | ICD-10-CM | POA: Diagnosis not present

## 2020-10-17 DIAGNOSIS — H1789 Other corneal scars and opacities: Secondary | ICD-10-CM | POA: Diagnosis not present

## 2020-11-17 DIAGNOSIS — Z23 Encounter for immunization: Secondary | ICD-10-CM | POA: Diagnosis not present

## 2020-11-30 DIAGNOSIS — H401134 Primary open-angle glaucoma, bilateral, indeterminate stage: Secondary | ICD-10-CM | POA: Diagnosis not present

## 2020-11-30 DIAGNOSIS — H18523 Epithelial (juvenile) corneal dystrophy, bilateral: Secondary | ICD-10-CM | POA: Diagnosis not present

## 2020-11-30 DIAGNOSIS — H04123 Dry eye syndrome of bilateral lacrimal glands: Secondary | ICD-10-CM | POA: Diagnosis not present

## 2020-11-30 DIAGNOSIS — Z961 Presence of intraocular lens: Secondary | ICD-10-CM | POA: Diagnosis not present

## 2021-02-01 DIAGNOSIS — H401134 Primary open-angle glaucoma, bilateral, indeterminate stage: Secondary | ICD-10-CM | POA: Diagnosis not present

## 2021-02-01 DIAGNOSIS — H04123 Dry eye syndrome of bilateral lacrimal glands: Secondary | ICD-10-CM | POA: Diagnosis not present

## 2021-02-01 DIAGNOSIS — Z961 Presence of intraocular lens: Secondary | ICD-10-CM | POA: Diagnosis not present

## 2021-02-01 DIAGNOSIS — H18523 Epithelial (juvenile) corneal dystrophy, bilateral: Secondary | ICD-10-CM | POA: Diagnosis not present

## 2021-03-09 DIAGNOSIS — N183 Chronic kidney disease, stage 3 unspecified: Secondary | ICD-10-CM | POA: Diagnosis not present

## 2021-03-09 DIAGNOSIS — I10 Essential (primary) hypertension: Secondary | ICD-10-CM | POA: Diagnosis not present

## 2021-03-09 DIAGNOSIS — E7800 Pure hypercholesterolemia, unspecified: Secondary | ICD-10-CM | POA: Diagnosis not present

## 2021-03-09 DIAGNOSIS — E1142 Type 2 diabetes mellitus with diabetic polyneuropathy: Secondary | ICD-10-CM | POA: Diagnosis not present

## 2021-03-09 DIAGNOSIS — E039 Hypothyroidism, unspecified: Secondary | ICD-10-CM | POA: Diagnosis not present

## 2021-03-20 DIAGNOSIS — H401112 Primary open-angle glaucoma, right eye, moderate stage: Secondary | ICD-10-CM | POA: Diagnosis not present

## 2021-03-20 DIAGNOSIS — E119 Type 2 diabetes mellitus without complications: Secondary | ICD-10-CM | POA: Diagnosis not present

## 2021-05-03 DIAGNOSIS — H18523 Epithelial (juvenile) corneal dystrophy, bilateral: Secondary | ICD-10-CM | POA: Diagnosis not present

## 2021-05-03 DIAGNOSIS — Z961 Presence of intraocular lens: Secondary | ICD-10-CM | POA: Diagnosis not present

## 2021-05-03 DIAGNOSIS — H04123 Dry eye syndrome of bilateral lacrimal glands: Secondary | ICD-10-CM | POA: Diagnosis not present

## 2021-05-03 DIAGNOSIS — H401134 Primary open-angle glaucoma, bilateral, indeterminate stage: Secondary | ICD-10-CM | POA: Diagnosis not present

## 2021-06-19 DIAGNOSIS — Z79899 Other long term (current) drug therapy: Secondary | ICD-10-CM | POA: Diagnosis not present

## 2021-06-19 DIAGNOSIS — H401134 Primary open-angle glaucoma, bilateral, indeterminate stage: Secondary | ICD-10-CM | POA: Diagnosis not present

## 2021-06-19 DIAGNOSIS — Z961 Presence of intraocular lens: Secondary | ICD-10-CM | POA: Diagnosis not present

## 2021-06-19 DIAGNOSIS — H04123 Dry eye syndrome of bilateral lacrimal glands: Secondary | ICD-10-CM | POA: Diagnosis not present

## 2021-06-19 DIAGNOSIS — H18523 Epithelial (juvenile) corneal dystrophy, bilateral: Secondary | ICD-10-CM | POA: Diagnosis not present

## 2021-09-05 DIAGNOSIS — E7800 Pure hypercholesterolemia, unspecified: Secondary | ICD-10-CM | POA: Diagnosis not present

## 2021-09-05 DIAGNOSIS — I10 Essential (primary) hypertension: Secondary | ICD-10-CM | POA: Diagnosis not present

## 2021-09-05 DIAGNOSIS — E1142 Type 2 diabetes mellitus with diabetic polyneuropathy: Secondary | ICD-10-CM | POA: Diagnosis not present

## 2021-09-05 DIAGNOSIS — Z0000 Encounter for general adult medical examination without abnormal findings: Secondary | ICD-10-CM | POA: Diagnosis not present

## 2021-09-21 DIAGNOSIS — H401131 Primary open-angle glaucoma, bilateral, mild stage: Secondary | ICD-10-CM | POA: Diagnosis not present

## 2021-10-09 DIAGNOSIS — H18522 Epithelial (juvenile) corneal dystrophy, left eye: Secondary | ICD-10-CM | POA: Diagnosis not present

## 2021-10-09 DIAGNOSIS — H04123 Dry eye syndrome of bilateral lacrimal glands: Secondary | ICD-10-CM | POA: Diagnosis not present

## 2021-10-09 DIAGNOSIS — Z961 Presence of intraocular lens: Secondary | ICD-10-CM | POA: Diagnosis not present

## 2021-10-09 DIAGNOSIS — Z9889 Other specified postprocedural states: Secondary | ICD-10-CM | POA: Diagnosis not present

## 2022-03-13 ENCOUNTER — Encounter: Payer: Self-pay | Admitting: Podiatry

## 2022-03-13 ENCOUNTER — Ambulatory Visit (INDEPENDENT_AMBULATORY_CARE_PROVIDER_SITE_OTHER): Payer: Medicare Other | Admitting: Podiatry

## 2022-03-13 DIAGNOSIS — M19071 Primary osteoarthritis, right ankle and foot: Secondary | ICD-10-CM

## 2022-03-13 DIAGNOSIS — E1142 Type 2 diabetes mellitus with diabetic polyneuropathy: Secondary | ICD-10-CM | POA: Diagnosis not present

## 2022-03-13 DIAGNOSIS — M778 Other enthesopathies, not elsewhere classified: Secondary | ICD-10-CM | POA: Diagnosis not present

## 2022-03-13 DIAGNOSIS — M19072 Primary osteoarthritis, left ankle and foot: Secondary | ICD-10-CM

## 2022-03-13 MED ORDER — TRIAMCINOLONE ACETONIDE 40 MG/ML IJ SUSP
40.0000 mg | Freq: Once | INTRAMUSCULAR | Status: AC
  Administered 2022-03-13: 40 mg

## 2022-03-13 NOTE — Progress Notes (Signed)
Subjective:  Patient ID: Jenna Chambers, female    DOB: 09/26/1931,  MRN: [id number] HPI Chief Complaint  Patient presents with   Foot Pain    Forefoot bilateral - feels like a band is wrapped around toes, tenderness along the tops of both feet, noticed it started worsening about 3 weeks ago, diabetic-last A1c was 6.3-diet controlled, takes gabapentin '100mg'$  at bedtime   New Patient (Initial Visit)    89 y.o. female presents with the above complaint.   ROS: Denies fever chills nausea vomiting muscle aches pains calf pain back pain chest pain shortness of breath  Past Medical History:  Diagnosis Date   Colon cancer ([place])    resecetion   Diabetes ([place])    Type 2   Glaucoma    --both eyes per patient getting worse   Gout    Hypertension    Osteopenia    Sleep apnea    no CPAP   Ulcerative colitis ([place])    Past Surgical History:  Procedure Laterality Date   BILATERAL SALPINGOOPHORECTOMY  1993   serous cystadenoma   CATARACT EXTRACTION  2006, 2008   [place]   incarcerated hernia   INTRAMEDULLARY (IM) NAIL INTERTROCHANTERIC Right 05/10/2018   Procedure: INTRAMEDULLARY (IM) NAIL INTERTROCHANTRIC;  Surgeon: [name], MD;  Location: [facility];  Service: Orthopedics;  Laterality: Right;   PARTIAL COLECTOMY  1991   TOTAL KNEE ARTHROPLASTY Left 2010    Current Outpatient Medications:    LAGEVRIO 200 MG CAPS capsule, SMARTSIG:4 Capsule(s) By Mouth Every 12 Hours, Disp: , Rfl:    acetaminophen (TYLENOL) 500 MG tablet, Take 1,000 mg by mouth every 6 (six) hours as needed., Disp: , Rfl:    alendronate (FOSAMAX) 70 MG tablet, , Disp: , Rfl:    amLODipine (NORVASC) 10 MG tablet, Take 1 tablet (10 mg total) by mouth daily., Disp: 30 tablet, Rfl: 0   aspirin 81 MG tablet, Take 81 mg by mouth daily., Disp: , Rfl:    brimonidine (ALPHAGAN P) 0.1 % SOLN, Place 1 drop into both eyes 2 (two) times daily., Disp: , Rfl:    brinzolamide (AZOPT) 1 %  ophthalmic suspension, Place 1 drop into both eyes 2 (two) times a day., Disp: , Rfl:    Cholecalciferol (VITAMIN D) 2000 UNITS CAPS, Take by mouth., Disp: , Rfl:    diclofenac sodium (VOLTAREN) 1 % GEL, Apply 2 g topically 4 (four) times daily., Disp: 1 Tube, Rfl: 1   folic acid (FOLVITE) 1 MG tablet, Take 1 tablet (1 mg total) by mouth daily., Disp: 30 tablet, Rfl: 0   furosemide (LASIX) 20 MG tablet, 1 tablet Orally Once a day for swelling for 90 days, Disp: , Rfl:    gabapentin (NEURONTIN) 100 MG capsule, Take 100 mg by mouth at bedtime., Disp: , Rfl:    hydrALAZINE (APRESOLINE) 10 MG tablet, Take 1 tablet (10 mg total) by mouth every 8 (eight) hours., Disp: 90 tablet, Rfl: 0   HYDROcodone-acetaminophen (NORCO/VICODIN) 5-325 MG tablet, Take 0.5-1 tablets by mouth every 6 (six) hours as needed for moderate pain. (Patient not taking: Reported on 04/01/2020), Disp: 30 tablet, Rfl: 0   levothyroxine (SYNTHROID) 150 MCG tablet, Take 1 tablet (150 mcg total) by mouth daily before breakfast., Disp: 30 tablet, Rfl: 0   losartan (COZAAR) 100 MG tablet, Take 1 tablet (100 mg total) by mouth daily., Disp: 30 tablet, Rfl: 0   Multiple Vitamin (MULTIVITAMIN WITH MINERALS) TABS tablet, Take  1 tablet by mouth daily., Disp: , Rfl:    polyethylene glycol (MIRALAX / GLYCOLAX) 17 g packet, Take 17 g by mouth 2 (two) times daily., Disp: , Rfl:    sulfaSALAzine (AZULFIDINE) 500 MG tablet, Take 1,000 mg by mouth 2 (two) times daily., Disp: , Rfl:    Tafluprost, PF, 0.0015 % SOLN, Place 1 drop into both eyes at bedtime., Disp: , Rfl:    traZODone (DESYREL) 50 MG tablet, Take 0.5 tablets (25 mg total) by mouth at bedtime., Disp: 10 tablet, Rfl: 0   ZIOPTAN 0.0015 % SOLN, Place 1 drop into both eyes daily., Disp: , Rfl: 12  Allergies  Allergen Reactions   Penicillin G Rash   Bimatoprost Other (See Comments)    irritation   Brinzolamide-Brimonidine Other (See Comments)    Irritation and headache   Penicillin V  Other (See Comments)   Timolol Other (See Comments)    bradycardia   Latex Rash   Penicillins Rash   Review of Systems Objective:  There were no vitals filed for this visit.  General: Well developed, nourished, in no acute distress, alert and oriented x3   Dermatological: Skin is warm, dry and supple bilateral. Nails x 10 are well maintained; remaining integument appears unremarkable at this time. There are no open sores, no preulcerative lesions, no rash or signs of infection present.  Vascular: Dorsalis Pedis artery and Posterior Tibial artery pedal pulses are 2/4 bilateral with immedate capillary fill time. Pedal hair growth present. No varicosities and no lower extremity edema present bilateral.   Neruologic: Grossly intact via light touch bilateral. Vibratory intact via tuning fork bilateral. Protective threshold with Semmes Wienstein monofilament intact to all pedal sites bilateral. Patellar and Achilles deep tendon reflexes 2+ bilateral. No Babinski or clonus noted bilateral.   Musculoskeletal: No gross boney pedal deformities bilateral. No pain, crepitus, or limitation noted with foot and ankle range of motion bilateral. Muscular strength 5/5 in all groups tested bilateral.  Pain on frontal plane range of motion at the tarsometatarsal joints fourth fifth tarsometatarsal joint left and 1 through 5 on the right side.  Gait: Unassisted, Nonantalgic.    Radiographs:  None taken  Assessment & Plan:   Assessment: Capsulitis and neuropathy bilateral foot.  Capsulitis is located over the dorsum of the foot at the tarsometatarsal joints.  Plan: She is going to start an revive alpha lipoic acid.  I also injected the dorsal aspect of the bilateral foot 10 mg Kenalog 5 mg Marcaine.  Should she have questions or concerns she will notify us immediately.     Jenna Chambers, [place]

## 2022-05-03 ENCOUNTER — Ambulatory Visit: Payer: Medicare Other | Admitting: Podiatry

## 2022-05-08 ENCOUNTER — Ambulatory Visit: Payer: Medicare Other | Admitting: Podiatry

## 2022-05-17 ENCOUNTER — Ambulatory Visit (INDEPENDENT_AMBULATORY_CARE_PROVIDER_SITE_OTHER): Payer: Medicare Other | Admitting: Podiatry

## 2022-05-17 ENCOUNTER — Ambulatory Visit: Payer: Medicare Other | Admitting: Podiatry

## 2022-05-17 ENCOUNTER — Encounter: Payer: Self-pay | Admitting: Podiatry

## 2022-05-17 DIAGNOSIS — M778 Other enthesopathies, not elsewhere classified: Secondary | ICD-10-CM | POA: Diagnosis not present

## 2022-05-17 DIAGNOSIS — M19071 Primary osteoarthritis, right ankle and foot: Secondary | ICD-10-CM

## 2022-05-17 DIAGNOSIS — E1142 Type 2 diabetes mellitus with diabetic polyneuropathy: Secondary | ICD-10-CM

## 2022-05-17 DIAGNOSIS — M19072 Primary osteoarthritis, left ankle and foot: Secondary | ICD-10-CM

## 2022-05-17 MED ORDER — TRIAMCINOLONE ACETONIDE 40 MG/ML IJ SUSP
20.0000 mg | Freq: Once | INTRAMUSCULAR | Status: AC
  Administered 2022-05-17: 20 mg

## 2022-05-17 NOTE — Progress Notes (Signed)
She presents today for follow-up of her capsulitis bilaterally.  She states that she really never got better after the shots.  She states that her neuropathy is not doing very well.  Due to she states that it hurts right here she points to the sinus tarsi.  Objective: Vital signs stable alert oriented x 3 no change in physical exam.  She still has subtalar joint capsulitis with pain on end range of motion of the subtalar joint left.  Assessment: Subtalar joint capsulitis neuropathy bilateral.  Plan: Discussed etiology pathology and surgical therapies at this point started her on gabapentin 300 mg just at nighttime for now.  I did reinject the subtalar joint left foot.  I will see how this is doing in 1 month.

## 2022-06-21 ENCOUNTER — Encounter: Payer: Self-pay | Admitting: Podiatry

## 2022-06-21 ENCOUNTER — Ambulatory Visit (INDEPENDENT_AMBULATORY_CARE_PROVIDER_SITE_OTHER): Payer: Medicare Other | Admitting: Podiatry

## 2022-06-21 DIAGNOSIS — M19071 Primary osteoarthritis, right ankle and foot: Secondary | ICD-10-CM

## 2022-06-21 DIAGNOSIS — M19072 Primary osteoarthritis, left ankle and foot: Secondary | ICD-10-CM | POA: Diagnosis not present

## 2022-06-24 NOTE — Progress Notes (Signed)
She presents today for follow-up of her capsulitis of her ankle bilaterally states that they are doing much better everything seems to be doing pretty well at this point.  Denies fever chills nausea vomit muscle aches pains no reproducible pain on palpation of the subtalar joint and the dorsal aspect of the bilateral foot over the first metatarsophalangeal joint.  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
